# Patient Record
Sex: Female | Born: 1945 | Race: White | Hispanic: No | Smoking: Never smoker
Health system: Southern US, Community
[De-identification: ages and names within clinical notes are randomized; demographics above are authoritative.]

## PROBLEM LIST (undated history)

## (undated) DIAGNOSIS — I480 Paroxysmal atrial fibrillation: Secondary | ICD-10-CM

## (undated) DIAGNOSIS — R059 Cough, unspecified: Secondary | ICD-10-CM

## (undated) DIAGNOSIS — R05 Cough: Secondary | ICD-10-CM

## (undated) DIAGNOSIS — R9430 Abnormal result of cardiovascular function study, unspecified: Secondary | ICD-10-CM

## (undated) DIAGNOSIS — M1990 Unspecified osteoarthritis, unspecified site: Secondary | ICD-10-CM

## (undated) DIAGNOSIS — T464X5A Adverse effect of angiotensin-converting-enzyme inhibitors, initial encounter: Secondary | ICD-10-CM

## (undated) DIAGNOSIS — R058 Other specified cough: Secondary | ICD-10-CM

## (undated) DIAGNOSIS — R079 Chest pain, unspecified: Secondary | ICD-10-CM

## (undated) DIAGNOSIS — H409 Unspecified glaucoma: Secondary | ICD-10-CM

## (undated) DIAGNOSIS — Z862 Personal history of diseases of the blood and blood-forming organs and certain disorders involving the immune mechanism: Secondary | ICD-10-CM

## (undated) DIAGNOSIS — I499 Cardiac arrhythmia, unspecified: Secondary | ICD-10-CM

## (undated) DIAGNOSIS — C801 Malignant (primary) neoplasm, unspecified: Secondary | ICD-10-CM

## (undated) DIAGNOSIS — Z853 Personal history of malignant neoplasm of breast: Secondary | ICD-10-CM

## (undated) DIAGNOSIS — E785 Hyperlipidemia, unspecified: Secondary | ICD-10-CM

## (undated) DIAGNOSIS — R55 Syncope and collapse: Secondary | ICD-10-CM

## (undated) DIAGNOSIS — E782 Mixed hyperlipidemia: Secondary | ICD-10-CM

## (undated) DIAGNOSIS — C50919 Malignant neoplasm of unspecified site of unspecified female breast: Secondary | ICD-10-CM

## (undated) DIAGNOSIS — D649 Anemia, unspecified: Secondary | ICD-10-CM

## (undated) DIAGNOSIS — J189 Pneumonia, unspecified organism: Secondary | ICD-10-CM

## (undated) DIAGNOSIS — Z9221 Personal history of antineoplastic chemotherapy: Secondary | ICD-10-CM

## (undated) DIAGNOSIS — S82891A Other fracture of right lower leg, initial encounter for closed fracture: Secondary | ICD-10-CM

## (undated) DIAGNOSIS — T8859XA Other complications of anesthesia, initial encounter: Secondary | ICD-10-CM

## (undated) DIAGNOSIS — K219 Gastro-esophageal reflux disease without esophagitis: Secondary | ICD-10-CM

## (undated) DIAGNOSIS — Z973 Presence of spectacles and contact lenses: Secondary | ICD-10-CM

## (undated) DIAGNOSIS — I219 Acute myocardial infarction, unspecified: Secondary | ICD-10-CM

## (undated) DIAGNOSIS — N819 Female genital prolapse, unspecified: Secondary | ICD-10-CM

## (undated) DIAGNOSIS — I10 Essential (primary) hypertension: Secondary | ICD-10-CM

## (undated) DIAGNOSIS — Z923 Personal history of irradiation: Secondary | ICD-10-CM

## (undated) DIAGNOSIS — I5181 Takotsubo syndrome: Secondary | ICD-10-CM

## (undated) DIAGNOSIS — E039 Hypothyroidism, unspecified: Secondary | ICD-10-CM

## (undated) DIAGNOSIS — R600 Localized edema: Secondary | ICD-10-CM

## (undated) DIAGNOSIS — G4733 Obstructive sleep apnea (adult) (pediatric): Secondary | ICD-10-CM

## (undated) DIAGNOSIS — M359 Systemic involvement of connective tissue, unspecified: Secondary | ICD-10-CM

## (undated) DIAGNOSIS — I700 Atherosclerosis of aorta: Secondary | ICD-10-CM

## (undated) HISTORY — DX: Adverse effect of angiotensin-converting-enzyme inhibitors, initial encounter: T46.4X5A

## (undated) HISTORY — DX: Cough: R05

## (undated) HISTORY — DX: Paroxysmal atrial fibrillation: I48.0

## (undated) HISTORY — PX: BACK SURGERY: SHX140

## (undated) HISTORY — PX: BUNIONECTOMY: SHX129

## (undated) HISTORY — DX: Syncope and collapse: R55

## (undated) HISTORY — PX: OTHER SURGICAL HISTORY: SHX169

## (undated) HISTORY — DX: Mixed hyperlipidemia: E78.2

## (undated) HISTORY — DX: Takotsubo syndrome: I51.81

## (undated) HISTORY — DX: Hypothyroidism, unspecified: E03.9

## (undated) HISTORY — DX: Chest pain, unspecified: R07.9

## (undated) HISTORY — DX: Other specified cough: R05.8

## (undated) HISTORY — DX: Unspecified osteoarthritis, unspecified site: M19.90

## (undated) HISTORY — PX: EYE SURGERY: SHX253

## (undated) HISTORY — DX: Abnormal result of cardiovascular function study, unspecified: R94.30

## (undated) HISTORY — PX: COLONOSCOPY: SHX174

## (undated) HISTORY — DX: Cough, unspecified: R05.9

## (undated) HISTORY — DX: Obstructive sleep apnea (adult) (pediatric): G47.33

## (undated) HISTORY — DX: Hyperlipidemia, unspecified: E78.5

## (undated) HISTORY — PX: GANGLION CYST EXCISION: SHX1691

## (undated) HISTORY — DX: Personal history of malignant neoplasm of breast: Z85.3

## (undated) HISTORY — PX: UPPER GI ENDOSCOPY: SHX6162

## (undated) HISTORY — PX: CHOLECYSTECTOMY: SHX55

## (undated) HISTORY — DX: Gastro-esophageal reflux disease without esophagitis: K21.9

---

## 1995-12-04 HISTORY — PX: TOTAL ABDOMINAL HYSTERECTOMY: SHX209

## 2000-10-10 ENCOUNTER — Encounter: Payer: Self-pay | Admitting: Neurosurgery

## 2000-10-10 ENCOUNTER — Ambulatory Visit (HOSPITAL_COMMUNITY): Admission: RE | Admit: 2000-10-10 | Discharge: 2000-10-11 | Payer: Self-pay | Admitting: Neurosurgery

## 2003-12-04 DIAGNOSIS — Z923 Personal history of irradiation: Secondary | ICD-10-CM

## 2003-12-04 DIAGNOSIS — C50919 Malignant neoplasm of unspecified site of unspecified female breast: Secondary | ICD-10-CM

## 2003-12-04 DIAGNOSIS — Z853 Personal history of malignant neoplasm of breast: Secondary | ICD-10-CM

## 2003-12-04 DIAGNOSIS — Z9221 Personal history of antineoplastic chemotherapy: Secondary | ICD-10-CM

## 2003-12-04 HISTORY — DX: Personal history of antineoplastic chemotherapy: Z92.21

## 2003-12-04 HISTORY — DX: Malignant neoplasm of unspecified site of unspecified female breast: C50.919

## 2003-12-04 HISTORY — DX: Personal history of malignant neoplasm of breast: Z85.3

## 2003-12-04 HISTORY — PX: BREAST LUMPECTOMY: SHX2

## 2003-12-04 HISTORY — DX: Personal history of irradiation: Z92.3

## 2004-02-24 ENCOUNTER — Encounter: Admission: RE | Admit: 2004-02-24 | Discharge: 2004-02-24 | Payer: Self-pay | Admitting: Surgery

## 2004-02-24 IMAGING — CR DG CHEST 2V
2 series · 2 of 2 positions shown · non-contrast
Comparison: none

CLINICAL DATA: Left breast cancer. 
 CHEST, TWO VIEWS 
 The heart size and mediastinal contours are normal.  The lungs are clear.  The visualized skeleton is unremarkable.
 IMPRESSION
 No active disease.

[view not recorded (1 of 2)]
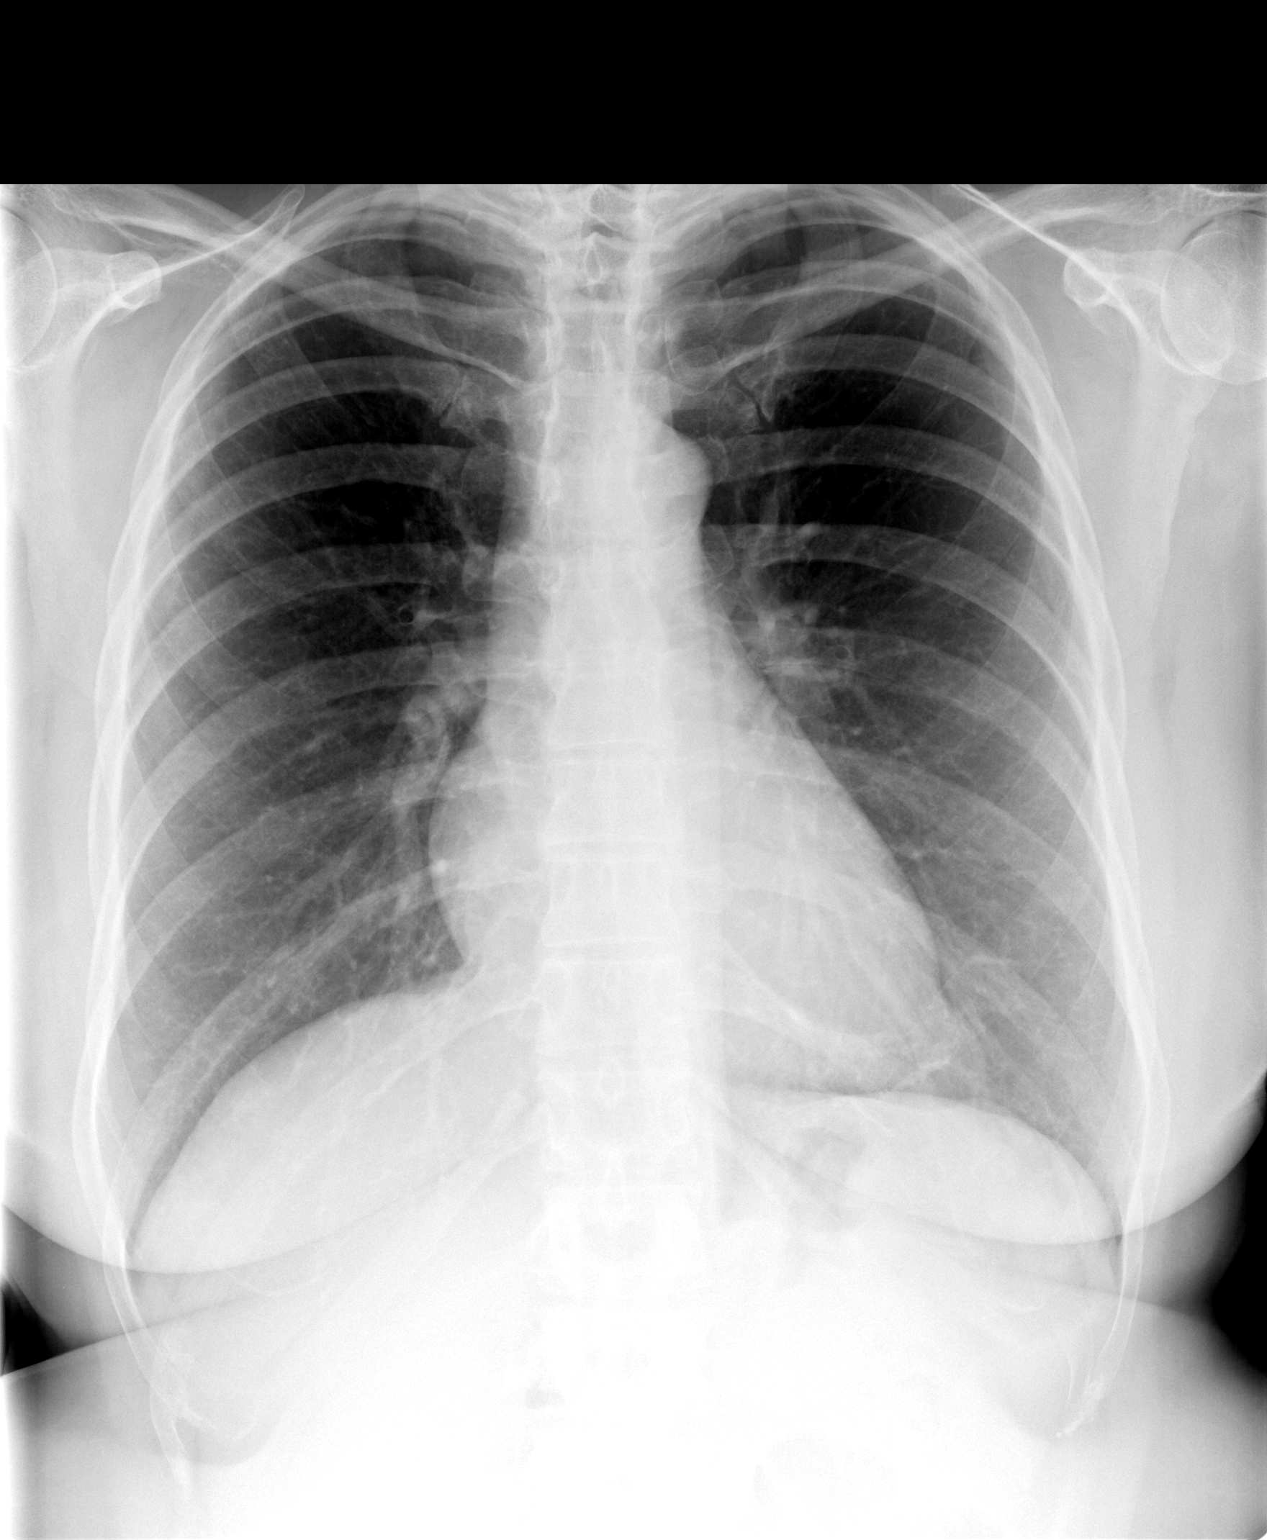

[view not recorded (2 of 2)]
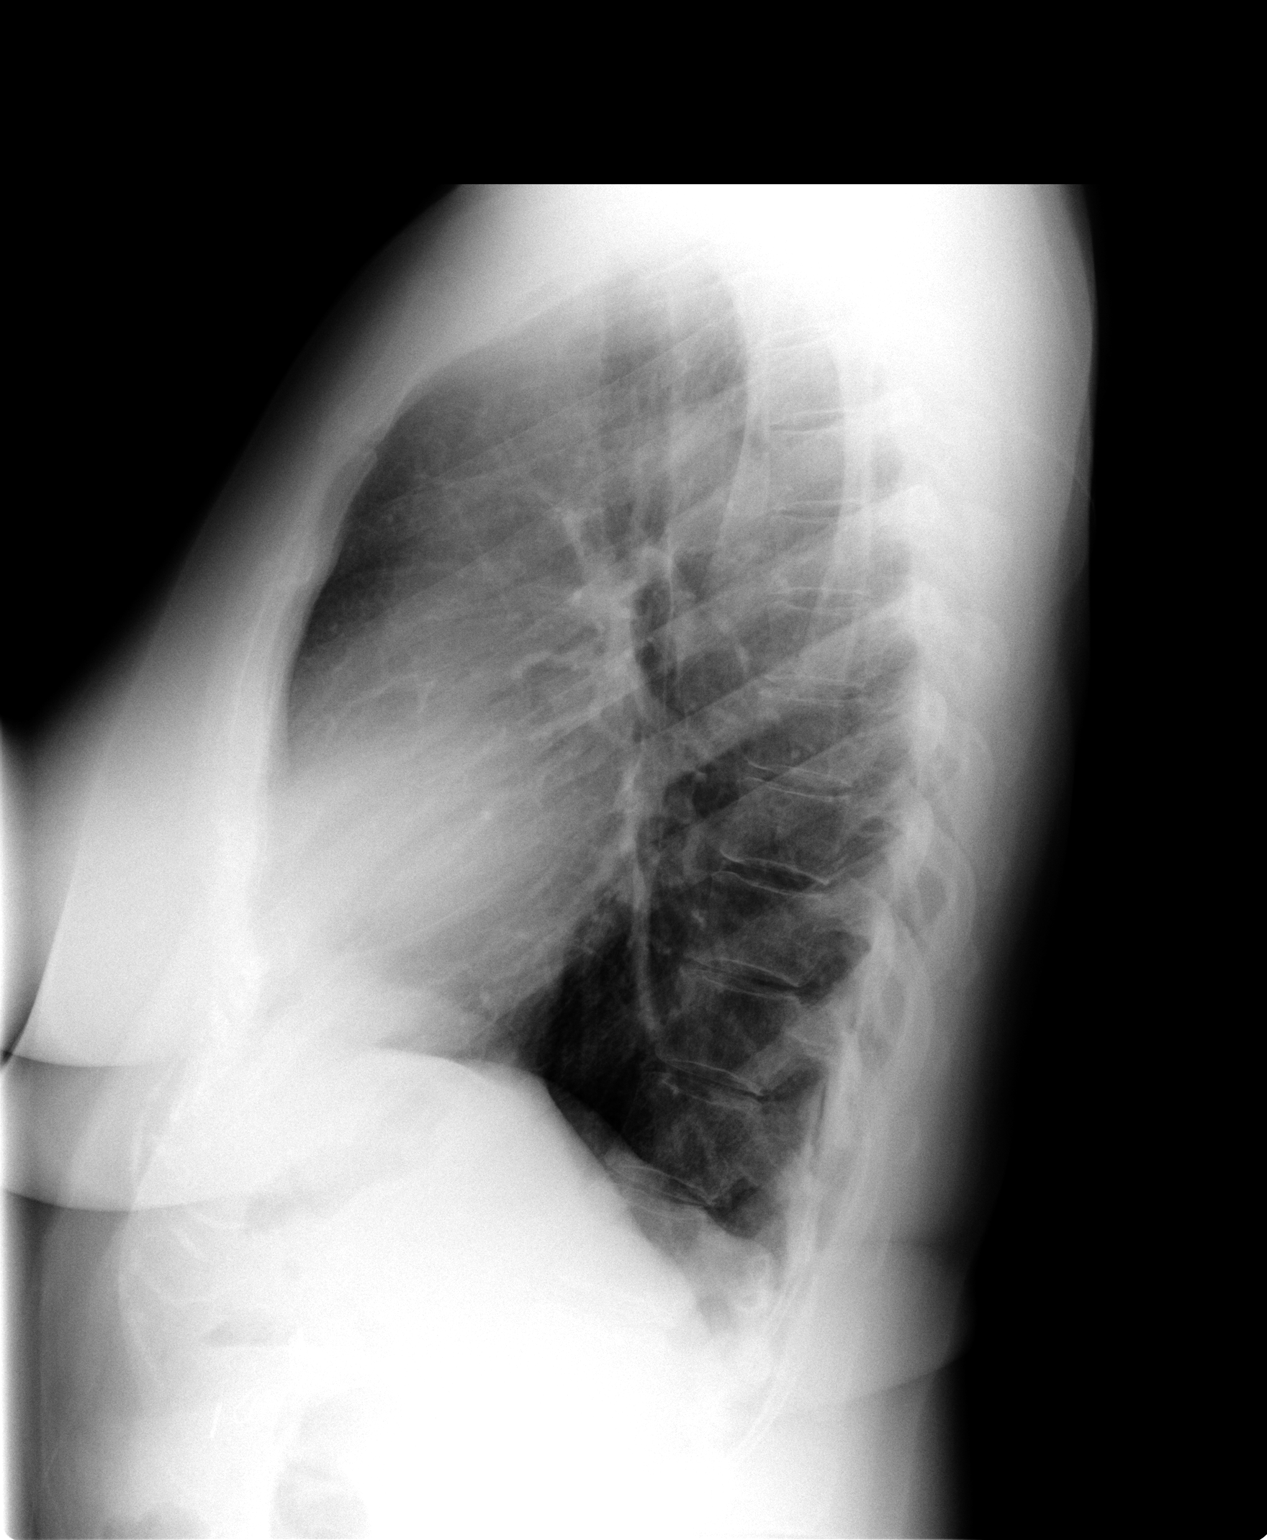

[2 of 2 positions shown; findings below may reference images not displayed]

## 2004-03-01 ENCOUNTER — Encounter: Admission: RE | Admit: 2004-03-01 | Discharge: 2004-03-01 | Payer: Self-pay | Admitting: Surgery

## 2004-03-06 ENCOUNTER — Encounter (HOSPITAL_COMMUNITY): Admission: RE | Admit: 2004-03-06 | Discharge: 2004-06-04 | Payer: Self-pay | Admitting: Obstetrics and Gynecology

## 2004-03-09 ENCOUNTER — Encounter: Admission: RE | Admit: 2004-03-09 | Discharge: 2004-03-09 | Payer: Self-pay | Admitting: Surgery

## 2004-03-09 ENCOUNTER — Encounter (INDEPENDENT_AMBULATORY_CARE_PROVIDER_SITE_OTHER): Payer: Self-pay | Admitting: *Deleted

## 2004-03-09 ENCOUNTER — Ambulatory Visit (HOSPITAL_COMMUNITY): Admission: RE | Admit: 2004-03-09 | Discharge: 2004-03-09 | Payer: Self-pay | Admitting: Surgery

## 2004-03-09 ENCOUNTER — Ambulatory Visit (HOSPITAL_BASED_OUTPATIENT_CLINIC_OR_DEPARTMENT_OTHER): Admission: RE | Admit: 2004-03-09 | Discharge: 2004-03-09 | Payer: Self-pay | Admitting: Surgery

## 2005-08-19 ENCOUNTER — Emergency Department (HOSPITAL_COMMUNITY): Admission: EM | Admit: 2005-08-19 | Discharge: 2005-08-19 | Payer: Self-pay | Admitting: Emergency Medicine

## 2006-12-03 DIAGNOSIS — I5181 Takotsubo syndrome: Secondary | ICD-10-CM

## 2006-12-03 HISTORY — DX: Takotsubo syndrome: I51.81

## 2007-10-24 ENCOUNTER — Ambulatory Visit: Payer: Self-pay | Admitting: Cardiology

## 2007-10-24 ENCOUNTER — Inpatient Hospital Stay (HOSPITAL_COMMUNITY): Admission: AD | Admit: 2007-10-24 | Discharge: 2007-10-26 | Payer: Self-pay | Admitting: Cardiology

## 2007-11-24 ENCOUNTER — Ambulatory Visit: Payer: Self-pay | Admitting: Cardiology

## 2007-11-24 ENCOUNTER — Encounter: Payer: Self-pay | Admitting: Physician Assistant

## 2007-12-08 ENCOUNTER — Encounter: Payer: Self-pay | Admitting: Physician Assistant

## 2008-01-15 ENCOUNTER — Encounter: Payer: Self-pay | Admitting: Cardiology

## 2008-01-15 ENCOUNTER — Ambulatory Visit: Payer: Self-pay | Admitting: Cardiology

## 2008-01-21 ENCOUNTER — Ambulatory Visit: Payer: Self-pay | Admitting: Cardiology

## 2008-03-03 ENCOUNTER — Encounter: Payer: Self-pay | Admitting: Physician Assistant

## 2008-09-24 ENCOUNTER — Ambulatory Visit: Payer: Self-pay | Admitting: Cardiology

## 2009-03-08 ENCOUNTER — Ambulatory Visit: Payer: Self-pay | Admitting: Cardiology

## 2009-06-03 ENCOUNTER — Encounter: Payer: Self-pay | Admitting: Cardiology

## 2009-06-10 ENCOUNTER — Encounter (INDEPENDENT_AMBULATORY_CARE_PROVIDER_SITE_OTHER): Payer: Self-pay | Admitting: *Deleted

## 2009-10-10 ENCOUNTER — Ambulatory Visit: Payer: Self-pay | Admitting: Cardiology

## 2010-10-23 ENCOUNTER — Ambulatory Visit: Payer: Self-pay | Admitting: Cardiology

## 2010-10-23 DIAGNOSIS — K449 Diaphragmatic hernia without obstruction or gangrene: Secondary | ICD-10-CM | POA: Insufficient documentation

## 2011-01-02 NOTE — Assessment & Plan Note (Signed)
Summary: 1 yr fu per nov reminder   Visit Type:  Follow-up Primary Provider:  Dr. Caryl Asp ([place])  CC:  chest pain.  History of Present Illness: Patient is seen for followup of a history of chest discomfort.  At a time of extreme stress in 2008 she had a Tako-tsuboevent.   She had anteroapical wall motion abnormality that improved by echo in 2009. She is doing well. She has had symptoms of GERD recently. She's taking medications and feeling better.  She is going to be referred to gastroenterology.  He's had some slight discomfort in her left shoulder.  I feel that this is not ischemic in origin.  Preventive Screening-Counseling & Management  Alcohol-Tobacco     Smoking Status: never  Current Medications (verified): 1)  Carvedilol 3.125 Mg Tabs (Carvedilol) .... Take 1 Tablet By Mouth Twice A Day 2)  Ramipril 5 Mg Caps (Ramipril) .... Take 1 Capsule By Mouth Once A Day 3)  Simvastatin 10 Mg Tabs (Simvastatin) .... Take 1 Tab By Mouth At Bedtime 4)  Synthroid 100 Mcg Tabs (Levothyroxine Sodium) .... Take 1 Tablet By Mouth Once A Day 5)  Aspirin 81 Mg Tbec (Aspirin) .... Take One Tablet By Mouth Daily 6)  Prilosec 20 Mg Cpdr (Omeprazole) .... Over The Counter As Needed 7)  Lumigan 0.03 % Soln (Bimatoprost) .... One Drop Into Each Eye At Bedtime 8)  Meclizine Hcl 25 Mg Tabs (Meclizine Hcl) .... Take 1 Tablet By Mouth Three Times A Day As Needed  Allergies (verified): No Known Drug Allergies  Comments:  Nurse/Medical Assistant: The patient's medications and allergies were verbally reviewed with the patient and were updated in the Medication and Allergy Lists.  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 minimal coronary disease... anteroseptal akinesis with an EF 55% EF  60-65%..... echo... February, 2009.Marland Kitchen.(LV function normalized) one and a HYPERTENSION, UNSPECIFIED (ICD-401.9) HYPERLIPIDEMIA-MIXED  (ICD-272.4) Hypothyroidism GERD  Review of Systems       Patient denies fever, chills, headache, sweats, rash, change in vision, change in hearing, chest pain, cough, nausea vomiting, urinary symptoms.  All other systems are reviewed and are negative.  Vital Signs:  Patient profile:   65 year old female Height:      65 inches Weight:      186 pounds BMI:     31.06 Pulse rate:   65 / minute BP sitting:   126 / 83  (left arm) Cuff size:   large  Vitals Entered By: Carlye Grippe (October 23, 2010 10:34 AM)  Nutrition Counseling: Patient's BMI is greater than 25 and therefore counseled on weight management options.  Physical Exam  General:  patient is stable.  She has gained weight. Head:  head is atraumatic. Eyes:  no xanthelasma. Neck:  no jugular venous distention. Chest Wall:  no chest wall tenderness. Lungs:  lungs are clear.  Respiratory effort is nonlabored. Heart:  cardiac exam reveals an S1-S2.  No clicks or significant murmurs. Abdomen:  abdomen is soft. Msk:  no musculoskeletal deformities. Extremities:  no peripheral edema. Skin:  no skin rashes. Psych:  patient is oriented to person time and place.  Affect is normal.   Impression & Recommendations:  Problem # 1:  GERD (ICD-530.81)  Her updated medication list for this problem includes:    Prilosec 20 Mg Cpdr (Omeprazole) ..... Over the counter as needed Recently the patient has had significant symptoms that do sound like GERD.  I have given her some  advice about being sure she does not recline after eating.  He needs to have her supper fully digested before going to bed.  I suggested some blocks under the head of her bed.  I talked with her about the timing of taking her Prilosec.  I do believe that the symptom is GERD and not ischemia.  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  It is normal.  I do not think that the  discomfort in her shoulder represents ischemia.  Overall cardiac status is stable.  No change in therapy.  Problem # 3:  HYPERLIPIDEMIA-MIXED (ICD-272.4)  Her updated medication list for this problem includes:    Simvastatin 10 Mg Tabs (Simvastatin) .Marland Kitchen... Take 1 tab by mouth at bedtime The patient is receiving medication for her lipids.  Follow up was with her primary M.D.  Problem # 4:  HYPERTENSION, UNSPECIFIED (ICD-401.9) Blood pressure is controlled.  No change in therapy.  We'll see her back in one year for cardiology followup.  Patient Instructions: 1)  Your physician wants you to follow-up in: 1 year. You will receive a reminder letter in the mail one-two months in advance. If you don't receive a letter, please call our office to schedule the follow-up appointment. 2)  Your physician recommends that you continue on your current medications as directed. Please refer to the Current Medication list given to you today.

## 2011-01-02 NOTE — Miscellaneous (Signed)
  Clinical Lists Changes  Observations: Added new observation of PRIMARY MD: Ladean Raya, MD ([place]) (10/23/2010 13:58)

## 2011-02-27 ENCOUNTER — Other Ambulatory Visit: Payer: Self-pay | Admitting: *Deleted

## 2011-02-27 DIAGNOSIS — I10 Essential (primary) hypertension: Secondary | ICD-10-CM

## 2011-02-27 MED ORDER — CARVEDILOL 3.125 MG PO TABS: 3.1250 mg | ORAL_TABLET | Freq: Two times a day (BID) | ORAL | Status: DC

## 2011-04-17 NOTE — Cardiovascular Report (Signed)
[name], [name]              ACCOUNT NO.:  [id number]   MEDICAL RECORD NO.:  [id number]          PATIENT TYPE:  INP   LOCATION:  2902                         FACILITY:  [facility]   PHYSICIAN:  Julie R. Juanda Chance, MD, FACCDATE OF BIRTH:  [birth year]   DATE OF PROCEDURE:  10/24/2007  DATE OF DISCHARGE:                            CARDIAC CATHETERIZATION   CLINICAL HISTORY:  Julie Jefferson is 65 years old and has had no prior  history of heart disease, although she does have a positive family  history of coronary heart disease.  She developed chest pain yesterday  and then had recurrent pain this morning and was seen at her private  medical doctor's office in [place] where her ECG was abnormal.  She  was sent to [facility] emergency room and had recurrent or  persistent chest pain and ECG suggesting an anteroseptal infarction.  She was seen by Dr. Myrtis Ser, and a code STEMI was called.  She was  transferred to [place] for further evaluation.  She was painfree on arrival.  She has been under a great deal of stress.  Yesterday, she was involved  in a law suit with her son and had to testify against her son.   PROCEDURE:  The procedure was performed by the femoral artery using an  arterial sheath and 6-French preformed coronary catheters.  A front wall  arterial puncture was performed.  Omnipaque contrast was used.  The  patient tolerated the procedure well and left the laboratory in  satisfactory condition.   RESULTS:  The left main coronary artery was free of significant disease.   The left anterior descending artery gave rise to a large diagonal branch  and two septal perforators and a small second diagonal branch.  There  were irregularities in the LAD, and there was 30% narrowing in the  midvessel but no significant obstructive disease.   The circumflex artery gave rise to an atrial branch, a marginal branch,  and a posterolateral branch.  These vessels appeared fairly smooth and  free of significant disease.   The right coronary artery gave rise to a right ventricle branch, a  posterior descending branch, and a posterolateral branch.  There were  mild irregularities, and there was 30% narrowing in the midvessel.   The left ventriculogram performed in the RAO projection showed akinesis  of the anterior wall.  The apex was a spared.  The estimated ejection  fraction was 55%.   The left ventriculogram performed in the LAO projection showed akinesis  of the superior aspect of the septum.  Once again, the apex was spared  and the lateral wall was vigorous.   The aortic pressure was 143/79 with a mean of 105, and the left neck  pressure was 143/10.   CONCLUSION:  1. Nonobstructive coronary artery disease with 30% narrowing in the      mid-left anterior descending, no significant obstruction of the      circumflex artery, and 30% narrowing in the mid-right coronary      artery.  2. Anteroseptal akinesis with overall good left ventricular function  with an estimated ejection fraction of 55%.   RECOMMENDATIONS:  The patient has only nonobstructive disease and has a  wall motion abnormality that does not appear to fit the distribution of  any of her coronary arteries.  She was involved in a stressful situation  yesterday with her son and had to testify against him in court.  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  It is unusual in that the apex is not  involved, but I suspect this is the most likely diagnosis.  We will plan  to treat her as an acute coronary syndrome with aspirin, Plavix, beta  blockers, and ACE inhibitors, with the hope and expectation that she  will have good recovery of her LV function.      Julie Elvera Lennox Juanda Chance, MD, [facility]  Electronically Signed     BRB/MEDQ  D:  10/24/2007  T:  10/25/2007  Job:  [id number]   cc:   Luis Abed, MD, [facility]  Cardiopulmonary Lab

## 2011-04-17 NOTE — Assessment & Plan Note (Signed)
[facility]                          [facility]   Julie Jefferson                     MRN:          [id number]  DATE:01/21/2008                            DOB:          [birth year]    REASON FOR VISIT:  Julie Jefferson is seen for cardiology followup.   HISTORY:  She is doing very well.  She had presented to [facility] with  chest discomfort and we were concerned about the possibility of an acute  MI.  Catheterization revealed only minor scattered 30% lesions.  She did  have anteroseptal akinesis with an ejection fraction of 55%.  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  She was  treated with all the appropriate medicines and she has done well at  home.  She is losing weight.  She is active in the rehab program and she  is feeling well.  She knows that if she gets towards high level of  stress, that she understands the situation and controls it better to  prevent having another stress-related event.  Today in the office, she  is stable.   ALLERGIES:  NO KNOWN DRUG ALLERGIES.   MEDICATIONS:  1. Aspirin 325.  2. Plavix 75.  3. Ramipril 2.5.  4. Eye drops.  5. Tamoxifen 20.  6. Coreg 3.125 b.i.d.  7. Synthroid 100.  8. Simvastatin 10.   OTHER MEDICAL PROBLEMS:  See the list below.   REVIEW OF SYSTEMS:  She feels great and her review of systems is  negative.   PHYSICAL EXAMINATION:  VITAL SIGNS:  Weight is 178 pounds, blood  pressure is 124/86 with a rate of 66.  GENERAL:  The patient is oriented to person, time and place.  Affect is  normal.  She is here with her daughter.  HEENT:  Reveals no xanthelasma.  She has normal extraocular motion.  NECK:  There are no carotid bruits.  There is no jugular venous  distention.  LUNGS:  Clear.  Respiratory effort is not labored.  CARDIAC:  Reveals S1-S2.  There are no clicks or significant murmurs.  ABDOMEN:  Soft.  EXTREMITIES:  She has no peripheral  edema.   DIAGNOSTICS:  The patient had a 2-D echo done in followup.  It is dated  January 15, 2008.  This study reveals mild LVH.  There is normal LV  function.  There are no focal wall motion abnormalities.  Therefore, the  left ventricular function has normalized completely.   PROBLEMS:  1. Takotsubo cardiomyopathy event that has stabilized and treated.      She is keeping her stress level down  2. Mild nonobstructive coronary disease.  She is on a low-dose statin      and Dr. Melvyn Neth will follow up her labs including left ventricular      function.  3. Good left ventricular function that has completely normalized now.  4. Hypertension.  5. Treated hypothyroidism.  6. History of breast cancer status post lumpectomy, radiation and      chemotherapy in the past.  She is stable.  She is doing well  and      quite active.   PLAN:  I will see her back in 6 months.  We will certainly consider  stopping her Plavix at that time.  If she were to develop any type of  bleeding difficulties or if her Plavix needed to be stopped for any type  of procedures, I think that this would be quite reasonable for her in  this setting.     Luis Abed, MD, [facility]  Electronically Signed    JDK/MedQ  DD: 01/21/2008  DT: 01/22/2008  Job #: [id number]   cc:   Caryl Asp, MD

## 2011-04-17 NOTE — Assessment & Plan Note (Signed)
[facility]                          [facility] NOTE   MISTEY, HOFFERT                     MRN:          [id number]  DATE:03/08/2009                            DOB:          Aug 15, 1946    Julie Jefferson is doing very well.  When I saw her last, she had traveled  to [place] and [place].  Since her last visit, she has been to [place] and  she was very excited about the trip and we talked about [place] at great  length.  The patient is feeling well.  She has not had any chest pain.  She is going about full activities.  Her blood pressure is under good  control and she continues to take her medications.   The patient had a Tako-tsubo-type event with a wall motion abnormality  in November 2008.  She had nonobstructive coronary disease.  Echo in  February 2009 showed normalization of her LV function.   PAST MEDICAL HISTORY:   ALLERGIES:  No known drug allergies.   MEDICATIONS:  Aspirin eye drops, tamoxifen, Coreg, Synthroid,  simvastatin, and ramipril.   OTHER MEDICAL PROBLEMS:  See the list on my note of September 24, 2008.   REVIEW OF SYSTEMS:  She has no fevers or chills.  There are no  headaches.  There are no skin rashes.  There is no change in her vision  or her hearing.  She has no shortness of breath or chest pain.  She has  no GI or GU symptoms.  There are no musculoskeletal complaints.  She has  no neurologic problems.  There are no psychological problems.   All other systems are reviewed and are negative.   PHYSICAL EXAMINATION:  VITAL SIGNS:  Blood pressure is 134/80 with a  pulse of 66.  Weight is 175 pounds.  GENERAL:  The patient is oriented to person, time, and place.  Affect is  normal.  HEENT:  No xanthelasma.  She has normal extraocular motion.  NECK:  There are no carotid bruits.  There is no jugular venous  distention.  LUNGS:  Clear.  Respiratory effort is not labored.  CARDIAC:  An S1 with an S2.  There are no  clicks or significant murmurs.  ABDOMEN:  Soft.  She has no peripheral edema.   EKG is done today.  I have reviewed it completely.  It is normal.  She  has normal sinus rhythm.   Problems are listed on my note of September 24, 2008.  The patient had a  Tako-tsubo-type event.  She has had no recurrence.  Her LV function has  normalized.  Her blood pressure is under good  control.  She is on low-dose simvastatin.  She is on thyroid medication.  She is stable.  I will see her back in 6 months.     Luis Abed, MD, [facility]  Electronically Signed    JDK/MedQ  DD: 03/08/2009  DT: 03/08/2009  Job #: [id number]   cc:   Dr. Caryl Asp

## 2011-04-17 NOTE — Assessment & Plan Note (Signed)
[facility]ALTHC[facility]                          [facility] NOTE   MARICIA, SCOTTI                       MRN:          [id number]  DATE:10/26/2007                            DOB:          1946-07-31    Ms. Zakrzewski is the mother of [name] who we know from [facility].  The patient was seen first on an urgent basis in the  emergency room at [facility] on October 24, 2007.  She had  severe stress the day before and chest pain.  Ultimately, she was taken  to [facility] and her catheterization showed only very minimal  coronary disease.  She had a wall motion abnormality in the mid anterior  wall and the base of the septum.  This did not fit any classic coronary  distribution.  Dr. Juanda Chance thought that most likely she had a variant of  Tako-Tsubo.   She is stable.  She will go home on aspirin, Plavix, ramipril, and  carvedilol.   We will see her back in the [place] office.  I will follow her there.  We  will arrange for followup echo.  Hopefully, her meds can be stopped over  time if her LV normalizes.     Luis Abed, MD, [facility]  Electronically Signed    JDK/MedQ  DD: 10/26/2007  DT: 10/26/2007  Job #: [id number]

## 2011-04-17 NOTE — Assessment & Plan Note (Signed)
[facility]                          [facility] NOTE   VEGAS, COFFIN                     MRN:          [id number]  DATE:11/24/2007                            DOB:          06/04/1946    CARDIOLOGIST:  Dr. Willa Rough.   PRIMARY CARE PHYSICIAN:  Dr. Caryl Asp in [place], [place].   REASON FOR VISIT:  Post hospitalization followup.   HISTORY OF PRESENT ILLNESS:  Ms. Kuehl is a very pleasant 65 year old  female patient who presents to the office today for post hospitalization  followup.  She presented to the emergency room at [facility] with  complaints of chest discomfort that started during a very stressful  court proceeding.  She had EKG changes and was transported emergently to  [facility] for a code STEMI.  Cardiac catheterization was  performed by Dr. Juanda Chance.  She had nonobstructive disease with 30%  disease in the mid left anterior descending, and 30% disease in the mid  right coronary artery.  She had anteroseptal akinesis with good left  ventricular function with an ejection fraction of 55%.  It was felt that  her overall picture was most consistent with a variant of Tako-Tsubo  cardiomyopathy.  She was treated with carvedilol, Altace, aspirin, and  Plavix.  She had a fairly uneventful post procedure course, and returns  to the office today for followup.   She notes she is doing well.  She usually gets along without chest pain  or shortness of breath.  She will occasionally rush to get up the steps  or up the hill, and will note some shortness of breath and anterior  chest tightness.  She describes NYHA class II symptoms.  She denies  orthopnea, PND, or pedal edema.  Denies any syncope.  She denies any  associated arm or jaw discomfort.   CURRENT MEDICATIONS:  1. Aspirin 325 mg daily.  2. Plavix 75 mg daily.  3. Ramipril 2.5 mg daily.  4. Lumigan eye drops.  5. Tamoxifen 20 mg daily.  6.  Carvedilol 3.125 mg b.i.d.  7. Synthroid 100 mcg daily.  8. Prilosec OTC p.r.n.   ALLERGIES:  No known drug allergies.   PHYSICAL EXAMINATION:  She is a well-nourished, well-developed female in  no distress.  Blood pressure is 145/92, pulse 64, weight 182 pounds.  HEENT:  Normal.  NECK:  Without JVD.  CARDIAC:  Normal S1, S2, regular rate and rhythm.  Soft, 1/6 systolic  ejection murmur best along the right upper sternal border.  LUNGS:  Clear to auscultation bilaterally without wheezing, rhonchi, or  rales.  ABDOMEN:  Soft, nontender with normoactive bowel sounds, no  organomegaly.  EXTREMITIES:  Without edema.  Right femoral arteriotomy site without  hematoma or bruit.  VASCULAR EXAM:  Without carotid bruits bilaterally.   Electrocardiogram reveals sinus rhythm with a heart rate of 64, normal  axis, poor R-wave progression, low voltage.   IMPRESSION:  1. Tako-Tsubo cardiomyopathy      a.     Status post acute coronary syndrome in the setting of #1       (  peak troponin 0.40).  2. Nonobstructive coronary artery disease as outlined above.  3. Good left ventricular function with anteroseptal akinesis at the      time of catheterization.  4. Hypertension.  5. Treated hypothyroidism.  6. History of breast carcinoma, status post lumpectomy and      radiation/chemotherapy.   PLAN:  The patient presents in the office today for a followup.  Overall, she is doing well.  I think she would benefit from cardiac  rehab, and she has not heard from the staff at [facility].  Her blood pressure is somewhat borderline.  At this point in time I plan  to:  1. Increase her Altace to 5 mg a day.  2. Will check a BMET today and a followup BMET in 10 days to followup      on her renal function and potassium, given the increase in her      angiotensin-converting enzyme inhibitor.  3. Will initiate low-dose statin with simvastatin 10 mg nightly for      cardiac benefit.  She will have  lipids and liver function tests      drawn in 12 weeks in followup.  4. We will recheck an echocardiogram in 6 weeks to reassess her wall      motion abnormality and ejection fraction.  5. Will facilitate referral to cardiac rehab at [facility].  6. She will follow up with Dr. Myrtis Ser in the next 3 months or sooner      p.r.n.      Tereso Newcomer, PA-C  Electronically Signed      Luis Abed, MD, [facility]  Electronically Signed   SW/MedQ  DD: 11/24/2007  DT: 11/24/2007  Job #: [id number]   cc:   Caryl Asp, MD

## 2011-04-17 NOTE — Assessment & Plan Note (Signed)
[facility]                          [facility] NOTE   Julie Jefferson                     MRN:          [id number]  DATE:09/24/2008                            DOB:          January 31, 1946    PRIMARY CARDIOLOGIST:  Julie Abed, MD, [facility].   REASON FOR VISIT:  Scheduled followup.   Julie Jefferson continues to do extremely well from a cardiac standpoint,  since being diagnosed with Takotsubo cardiomyopathy variant back in  November 2008.  She is extremely active with her mission trips, most  recently having come back from [place].  She had also been in [place]  earlier this year.   She reports no interim development of angina pectoris, significant  dyspnea, orthopnea, PND, or lower extremity edema.   Also, Julie Jefferson exercises rigorously on a regular basis, with no  associated symptoms.   CURRENT MEDICATIONS:  Plavix, full dose aspirin, ramipril 2.5 daily,  Coreg 3.125 b.i.d., Synthroid 0.1 daily, simvastatin 10 daily, tamoxifen  20 daily, and Lumigan eye drops.   PHYSICAL EXAMINATION:  VITAL SIGNS:  Blood pressure 130/85, pulse 69,  regular rate, 176 (down 2).  GENERAL:  A 65 year old female, sitting upright, no distress.  HEENT:  Normocephalic, atraumatic.  PERRLA.  EOMI.  NECK:  Palpable carotid pulse without bruits; no JVD.  LUNGS:  Clear to auscultation in all fields.  HEART:  Regular rate and rhythm.  No significant murmurs.  No rubs or  gallops.  ABDOMEN:  Soft, nontender, intact bowel sounds.  EXTREMITIES:  Palpable pulses without edema.  NEURO:  No focal deficit.   IMPRESSION:  1. Takotsubo cardiomyopathy variant.      a.     Normalized LVF (EF 60-65%), normal wall motion, February       2009.      b.     EF of 55% with anteroseptal akinesis by cardiac       catheterization, November 2008.      c.     Nonobstructive coronary artery disease.  2. Hypertension.  3. Dyslipidemia.  4. Hypothyroidism.   PLAN:  1.  Discontinue Plavix.  Reduce aspirin to 81 mg daily.  Otherwise      continue current cardiac      medications.  2. Schedule return clinic followup with Dr. Myrtis Ser in 6 months.      Julie Searing, PA-C  Electronically Signed      Julie Abed, MD, [facility]  Electronically Signed   GS/MedQ  DD: 09/24/2008  DT: 09/25/2008  Job #: [id number]   cc:   Julie Asp, MD

## 2011-04-17 NOTE — Discharge Summary (Signed)
NAMEAYAN, YANKEY NO.:  [id number]   MEDICAL RECORD NO.:  [id number]          PATIENT TYPE:  INP   LOCATION:  2039                         FACILITY:  [facility]   PHYSICIAN:  Luis Abed, MD, FACCDATE OF BIRTH:  [birth year]   DATE OF ADMISSION:  10/24/2007  DATE OF DISCHARGE:  10/26/2007                               DISCHARGE SUMMARY   PRIMARY CARDIOLOGIST:  Luis Abed, MD, [facility]   PRIMARY CARE PHYSICIAN:  Caryl Asp, M.D. in [place], [place].   PROCEDURES PERFORMED DURING HOSPITALIZATION:  Cardiac catheterization:  LAD has 30% small irregularities.  Circumflex was normal.  Right  coronary artery had 30% mid LV AS AI but no gradient, suspect this was  Takotsubo wall motion abnormality does not fit any vascular territory  and patient had recent stress with son in a lawsuit, would recommend  medical treatment.   FINAL DISCHARGE DIAGNOSES:  1. Takotsubo.  2. Hypothyroidism.  3. Diabetes type 2.  4. Hypertension.  5. History of cancer.   HOSPITAL COURSE:  This is a very pleasant 65 year old Caucasian female  with no prior history of coronary artery disease who presented to  [facility] Emergency Room with new onset of chest pain and  electrocardiographic changes worrisome for acute ST elevation myocardial  infarction.  The patient reported feeling four hours of mid sternal  chest discomfort at approximately 4:30 p.m. prior to coming in when she  was in the midst of a very stressful court proceeding, at which time she  developed anterior chest discomfort described as a sitting sensation on  her chest with radiation to the back and some associated dyspnea and  nausea but no diaphoresis or vomiting.  She would rate it as a 7/10.  The symptoms resolved on her own.  The following morning on day of  admission after she had already awakened, she had a similar discomfort  but not as severe as the day before episode and she has experienced  intermittent chest  tightness on and off but is currently pain free.  The  patient did contact her primary care physician and was seen by him  earlier in the day and electrocardiogram revealed normal sinus rhythm  with 1 mm ST segment elevation in leads VI and VII with associated ST  coving, less than 1 mm of ST segment depression in inferior-lateral  leads.  The patient was instructed to come to [facility]  were a repeat EKG showed persistent ST elevation of 1 to 2 mm in leads I  and II.   The patient was given a full dose of aspirin earlier in the morning and  was given another dose that evening.  She was started on heparin in  [facility] Emergency Room, given 300 mg of Plavix, IV Lopressor and IV  nitroglycerin was started and the patient had cardiac enzymes cycled.   The patient was seen and examined by Dr. Willa Rough and Gene Serpe,  PA.  It was found that the patient should be transferred emergently to  the [facility] for cardiac catheterization in this setting.  The patient's  blood glucose was 146 and creatinine was 1.10, BUN was 11.  Her initial total CPK was 145 with a CK-MB of 6.6 and a relative index  of 4.5 with a troponin of 0.40.   On arrival to [facility] Emergency Room, the patient was taken to [facility] Cardiac Catheterization lab where a cardiac catheterization was  completed by Dr. Charlies Constable.  It was found that she had minimal  irregularities in her LAD and right coronary artery, however, she did  have some wall motion abnormalities which showed a variant of Takotsubo,  but no vascular territory was identified.  Her EF was 55%.  The patient  was started on Plavix, aspirin, Ramipril and Coreg during admission.  The patient had no further complaints of chest pain during  hospitalization.  The patient was seen and examined by Dr. Dietrich Pates and  then seen and examined by Dr. Willa Rough on day of discharge.  The  patient was stable with no further complaints of  chest discomfort, was  diagnosed with a non-ST elevated MI, probably Takotsubo event secondary  to severe stress.   On discharge, the patient will be started on aspirin, Plavix, Ramipril  and Coreg and continue her other home medications and follow up with Dr.  Willa Rough in the [place] office.   DISCHARGE LABORATORY:  TSH 2.390, troponin 0.12 and 0.23 respectively,  cholesterol 161, triglycerides 53, HDL 70, LDL 80.  Sodium 142,  potassium 3.5, chloride 110, CO2 25, glucose 89, BUN 11, creatinine  0.84, hemoglobin 11.7, hematocrit 33.9, white blood cells 6.2, platelets  213.   VITAL SIGNS:  Blood pressure 130/70, heart rate 59, respirations 14 to  16.   DISCHARGE MEDICATIONS:  1. Aspirin 325 mg daily.  2. Plavix 75 mg daily.  3. Ramipril 2.5 mg daily.  4. Lumigan 1 drop to both eyes daily.  5. Protonix 80 mg daily.  6. Tamoxifen 20 mg daily.  7. Coreg 3.125 mg twice a day.  8. Synthroid 100 mcg once a day.   ALLERGIES:  No known drug allergies.   FOLLOW-UP PLANS AND APPOINTMENTS:  1. The patient will be seen by Dr. Willa Rough in the [place] office.      Our office will call as this is a weekend and an appointment will      be scheduled.  2. The patient is to follow up with her primary care physician, Dr.      Caryl Asp in [place], for continued medical management.  3. The patient has been given post cardiac catheterization      instructions with particular emphasis on the right groin site for      evidence of bleeding, hematoma, signs of infection, or severe pain.   Time spent with the patient to include physician time 1 hour.      Bettey Mare. Lyman Bishop, NP      Luis Abed, MD, [facility]  Electronically Signed    KML/MEDQ  D:  10/26/2007  T:  10/26/2007  Job:  [id number]   cc:   Caryl Asp, MD

## 2011-04-20 NOTE — Op Note (Signed)
[place]. [facility]  Patient:    Julie Jefferson, PIGMAN                     MRN: [id number] Proc. Date: 10/10/00 Adm. 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  PROCEDURE:  Hemi/semi-laminectomy L4-5 left with foraminotomy and microdiskectomy with microdissection.  SURGEON:  Danae Orleans. Venetia Maxon, M.D.  ASSISTANT:  Tanya Nones. Jeral Fruit, M.D.  ANESTHESIA:  General endotracheal.  ESTIMATED BLOOD LOSS:  50 cc.  COMPLICATIONS:  None.  DISPOSITION:  To recovery.  INDICATIONS:  Julie Jefferson is a 65 year old woman with a herniated lumbar disk at the L4-5 level with significant left L5 radiculopathy.  It was elected to take her to surgery for lumbar microdiskectomy.  DESCRIPTION OF PROCEDURE:  Following the satisfactory and uncomplicated induction of general endotracheal anesthesia and placement of intravenous line, the patient was placed in a prone position on the Wilson frame.  Care was taken to pad all soft tissue and bony prominences.  Area of planned incision was infiltrated with 1/4% Marcaine, 1/2% lidocaine 1:200,000 epinephrine.  After standard prep and draping, an incision was made over what was felt to be the L4-5 interspace which was carried through copious adipose tissue to the lumbodorsal fascia which was then incised with electrocautery and the left side at midline.  Superior ostial dissection was performed exposing what was felt to be L4-5 level.  An intraoperative x-ray demonstrated a probe that was felt to be the L3-4 level, and subsequently the exposure was moved down one level.  Laminectomy was created using Midas Rex drill and 8  bur and completing with Kerrison rongeurs and removing ligamentum flavum and medial facet and exposing the interspace.  The floor of the canal was palpated, and there was not felt to be significant disk herniation. Subsequent x-ray demonstrated a probe at the L3-4 level, consequently exposure was then carried to the L4-5 interspace, and self-retaining retractor was placed at this level.  The laminectomy was created with the Midas Rex drill, ______  bur, and the ligamentum flavum was removed.  There was marked hypertrophy of the facet, and medial facetectomy was performed.  The microscope was then brought into the field and using microdissection technique, the common drill tube was decompressed, and the L5 nerve root was found to be severely compressed by overlying facet and ligamentous tissue. This was decompressed.  the nerve root was then retracted medially, and there was a large spondylitic spur and subligamentous disk herniation directly underlying the takeoff of the L5 nerve root.  Using the Derrico nerve root retractor to protect the nerve root, the disk space was incised, and disk material was removed in a piecemeal fashion with a variety to pituitary rongeurs.  The Epstein curettes were used to denude the endplates of residual cartilaginous and disk material, and medial and lateral extent of the interspace was decompressed using a variety of pituitary rongeurs.  The L4 nerve root was felt to be well decompressed, as it extended out the neuroforamen, and the L5 nerve root was felt to be well decompressed as was the floor  of the spinal canal.  Redundant annulus was cauterized with bipolar electrocautery.  The wound was then copiously irrigated with Bacitracin saline.  The self-retaining retractor was removed.  The microscope was taken out of the field.  Lumbodorsal fascia was reapproximated with 0 Vicryl sutures, subcutaneous tissue was reapproximated with 2-0 Vicryl  interrupted inverted sutures.  The skin edges were reapproximated with interrupted 3-0 Vicryl subcuticular stitch.  The wound was dressed with Benzoin and Steri-Strips and Telfa gauze and tape.  The patient was extubated in the operating room and taken to the recovery room in stable and satisfactory condition having tolerating the operation well.  Counts were correct at the end of the case. DD:  10/10/00 TD:  10/10/00 Job: [id number]

## 2011-04-20 NOTE — Op Note (Signed)
Julie Jefferson, SKILTON                        ACCOUNT NO.:  [id number]   MEDICAL RECORD NO.:  [id number]                   PATIENT TYPE:  AMB   LOCATION:  DSC                                  FACILITY:  [facility]   PHYSICIAN:  Sandria Bales. Ezzard Standing, M.D.               DATE OF BIRTH:  1946-08-08   DATE OF PROCEDURE:  03/09/2004  DATE OF DISCHARGE:                                 OPERATIVE REPORT   PREOPERATIVE DIAGNOSIS:  Left breast cancer.   POSTOPERATIVE DIAGNOSIS:  Left breast cancer with positive left axillary  sentinel lymph node biopsy.   OPERATION PERFORMED:  Injection of isosulfan blue, left partial mastectomy  with needle localization, left sentinel lymph node biopsy done with full  left axillary node dissection.   SURGEON:  Sandria Bales. Ezzard Standing, M.D.   ASSISTANT:  None.   ANESTHESIA:  General endotracheal.   ESTIMATED BLOOD LOSS:  100 mL.   DRAINS:  10 mm Jackson-Pratt drain placed in left axilla.   INDICATIONS FOR PROCEDURE:  Ms. Manchester is a 65 year old white female who is  a patient of Willette Cluster, M.D. and Caryl Asp, M.D. of [facility] in [place], [place].  Her daughter Helmut Muster works at [employer] with the cardiac team and has her mother down here for evaluation.  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  I discussed with her the options for  treatment which included a mastectomy versus lumpectomy with sentinel lymph  node biopsy.  She has elected to proceed with a partial mastectomy  (lumpectomy) and sentinel lymph node biopsy and possible axillary node  dissection.  She hopes that Dr. Tobie Poet of [facility] at  [facility] in [place] can carry out her medical oncology  postoperatively and that Dr. Baruch Merl of the [facility] at  [facility] in [place], [place] can carry out her postoperative  radiation.  I discussed both the  indications and potential complication with  the patient.   DESCRIPTION OF PROCEDURE:  The patient presented to the operating room.  I  had a needle localization wire placed in the left breast. The mass was  somewhat hard to feel but it was right off the edge of the areola at the 1  o'clock position.  She also had sulfur colloid radioisotope injected in a  circumareolar fashion and she presented to the operating room where I  injected around the areola with isosulfan blue.  I then used a tracer to  find and map a left axillary lymph node.  I then prepped the left breast  with Betadine solution and left axilla and sterilely draped her.  I first  went after the sentinel lymph node which was identified with the NeoProbe,  made an incision in the left axilla and cut down to a lymph node which was  probably about 1.5 cm in  length.  It was pale blue.  It had counts of 1800  and the background counts were only 20, so this was a hot node from the  radioisotope standpoint blue node and it was sent to pathology. _________  called me back and told me that the lymph node had gross tumor in it.   I then turned my attention to the left and to the left breast. Using the  guidewire as a guide, I tried to go around at least 1 to 2 cm _________  margins around the edge of this tumor which was partly palpable and partly I  used the guidewire as a guide.  In all directions I felt I did not cut  across any gross tumor.  I sent the entire specimen marking it with markers  caudad, medial and cranial and the wire was coming out lateral, first sent  to Dr. Cheral Marker who said that the specimen seemed well centered on  the specimen mammogram.  Dr. Almyra Free said the margins were clear.   I then turned my attention and did a full axillary node dissection which I  extended to the left axillary dissection carried up to the inferior aspects  of the axillary vein.  I found the long thoracic nerve of Bell and the   thoracodorsal nerve and both these nerves were spared during dissection.  I  just swept the axillary contents out down inferiorly.  I did have some  tributary vessels which were ligated with a clip applier and then some  ligated with 3-0 silk ligations and some with 3-0 Vicryl pop-offs.  When I  had the entire axillary contents excised, I then sent these for permanent  pathology.   I then marked the fresh biopsy site with small clips, the left axilla.  I  irrigated out.  Again, I used clips to ligate some of the vessels and  some  of the small nerves.  I then closed that in two layers, first a deep layer  with 3-0 Vicryl suture, superficial layer with skin staples.  I placed a 10  mm Jackson-Pratt drain in the left axilla and sewed this in place with a 2-0  nylon suture.  I then closed the breast in two layers with a deeper layer of  3-0 Vicryl sutures, skin with a 5-0 running Vicryl suture.  Painted the skin  with tincture of benzoin and Steri-Strips.   Again she will plan to spend the night in the hospital.  As an outpatient  she will be taught how to empty her drain and be controlled with pain  medicines and plan to discharge tomorrow morning.                                               Sandria Bales. Ezzard Standing, M.D.    DHN/MEDQ  D:  03/09/2004  T:  03/10/2004  Job:  [id number]   cc:   Caryl Asp, M.D.  [address]  [place] [address]   Willette Cluster, M.D.  [address]  [place] [address]   Tobie Poet, M.D.  [facility]  [facility]   Baruch Merl, M.D.  [facility]  [facility]  [place]

## 2011-04-20 NOTE — H&P (Signed)
[place]. [facility]  Patient:    Julie Jefferson, Julie Jefferson                     MRN: [id number] Adm. Date:  [id number] Attending:  Josie Saunders                         History and Physical  REASON FOR ADMISSION:  Herniated lumbar disk.  HISTORY OF PRESENT ILLNESS:  Ms. 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, [place], who presented at the request of Dr. Melvyn Neth for neurosurgical consultation for a herniated lumbar disk.  I initially saw her in the office on October 03, 2000.  The patient describes approximately a three-month history of severe left leg pain. She said it started suddenly, and that she is always in pain, and she is not sleeping, and not able to get any rest.  She says that over the last several days she developed some right leg pain which goes down to her right knee, but says that this is not nearly as severe as her left leg.  She describes that she has pain to the calf and back of her calf and heel on the left.  She feels that she has occasional weakness, but not severe weakness at this point.  She has been working, but it is very difficult for her to do so.  She denies any bowel or bladder dysfunction other than constipation, which she says is a normal baseline status for her.  She says that her leg hurts her much worse than her back.  She says that her back is not extremely painful, but more weak.  She is taking Naprosyn and says that it does not help a lot.  She tried Voltaren and says that this does not help her at all.  She tried Neurontin and said that this was not helpful, and made her sleepy the next day after she took it at night.  Julie Jefferson presents with an MRI of the lumbar spine which was obtained at [facility] in [place], which shows significant degenerative disk disease at the L4-5 level on the left, with circumferential disk bulge, and evidence of modic changes  of the end plates of the L4 and L5 vertebrae.  The remaining levels appear to be normal.  At the L4-5 level there is a significant left-sided disk herniation extending to the left of the midline. There is also some milder foraminal stenosis at L4-5 on the right.  This does not appear to be significantly compressing the L4 nerve root in the foramen.  REVIEW OF SYSTEMS:  A detailed review of systems sheet was reviewed with the patient.  Pertinent positives are:  EYES:  She wears glasses.  CARDIOVASCULAR: She notes leg pain with walking, left greater than right.  GASTROINTESTINAL: She has a hyper-gag reflex.  MUSCULOSKELETAL:  She notes back pain, leg pain, arthritis, and that her left leg hurts her more than her right leg. ENDOCRINE:  She notes thyroid disease.  All other systems are negative.  PAST MEDICAL HISTORY:  Hypothyroidism.  PAST SURGICAL HISTORY/HOSPITALIZATIONS: 1. Hysterectomy. 2. Cholecystectomy. 3. Corrective eye surgery.  CURRENT MEDICATIONS: 1. Premarin 0.625 mg q.d. 2. Synthroid 0.1 mg q.d. 3. Naprosyn 500 mg q.d.  ALLERGIES:  No known drug allergies.  Height and weight:  She is  5 feet 5-1/2 inches tall, weighing 175 pounds.  FAMILY HISTORY:  Her mother  is age 5.  She is obese, in fair health and has had a minor stroke.  Her father is deceased secondary to heart disease, high cholesterol, and high blood pressure.  SOCIAL HISTORY:  Julie Jefferson is married.  Her husband has cancer, and is on disability.  He is a five-year survivor of lung cancer and renal cell cancer. She is a nonsmoker, nondrinker.  No history of substance abuse.  DIAGNOSTIC STUDIES:  As above.  PHYSICAL EXAMINATION:  GENERAL:  Julie Jefferson is a pleasant cooperative uncomfortable-appearing moderately-obese middle-aged white female.  HEENT:  Normocephalic, atraumatic.  Pupils equal, round, reactive to light. Extraocular muscles intact.  Sclerae white.  Conjunctivae pink.  Oropharynx benign.   Uvula midline.  NECK:  No masses, meningismus, deformities, tracheal deviation, jugular venous distention, or carotid bruits.  There is a normal cervical range of motion. Spurlings test is negative, without reproducible radicular pain on turning the patients  head to either side.  Lhermittes sign is not present with axial compression.  LUNGS:  There is normal respiratory effort with good intercostal function. The lungs are clear to auscultation.  There are no rales, rhonchi, or wheezes.  CARDIOVASCULAR:  The heart has a regular rate and rhythm to auscultation.  No murmurs are appreciated.  EXTREMITIES:  There is no extremity edema, clubbing, or cyanosis.  There are palpable pedal pulses.  ABDOMEN:  Soft, nontender.  No hepatosplenomegaly appreciated or masses. There are active bowel sounds, no guarding or rebound.  MUSCULOSKELETAL:  The patient is able to walk about the examination room with a normal casual gait.  She is able to stand on her heels and toes.  She has minimal midline low back pain to palpation and left-sided sciatic notch discomfort, negative on the right.  She is able to bend to within eight inches of the floor.  She does not appear to have significant scoliosis of the lumbar spine.  Straight leg raising is positive at 80 degrees on the left, negative on the right.  Negative Patricks test bilaterally.  NEUROLOGIC:  The patient is oriented to time, person, and place.  She has good recall of both recent and remote memory with normal attention span and concentration.  The patient speaks clear and fluent speech and exhibits normal language function and appropriate fund of knowledge.  Cranial nerve examination:  Pupils equal, round, reactive to light.  Extraocular movements are full.  Visual fields are full to confrontational testing.  Facial sensation and facial motor are intact and symmetric.  Hearing is intact to finger rub.  Palate is upgoing.  Shoulder shrug is  symmetric.  The tongue protrudes in the midline.  Motor examination:  Motor strength is 5/5 in the bilateral deltoids, biceps, triceps, hand grips, wrist extensors, and  interosseous.  In the lower extremities the motor strength is 5/5 in the hip flexion, extension, quadriceps, hand strength, plantar flexion, right extensor hallucis longus, right dorsiflexion, and 4+ in the left dorsiflexion, and 4- in the left extensor hallucis longus strength with 4 to 4- in the left gluteus strength.  Sensory examination:  Normal to light touch and pinprick sensation in the upper and lower extremities, with the exception of decreased pin sensation in the left S1 distribution.  Deep tendon reflexes 2 in the biceps, triceps, and brachialis, 2 at the knee, 2 at the ankles.  Great toes are downgoing to plantar stimulation.  Negative Hoffmans sign.  Cerebellar examination:  Normal coordination in the upper and lower extremities.  Normal rapid  alternating movements.  Romberg test is negative.  IMPRESSION/RECOMMENDATIONS:  Ms. Vannah Nadal is a 65 year old woman with significant left lower extremity pain and weakness in the L5 distribution. She has gluteus weakness, dorsiflexion, and extensor hallucis weakness on the left.  She has moderately positive straight leg raise.  She has marked degenerative disk disease at the L4-5 level, and a broad-based spondylitic disk protrusion with superimposed disk herniation as well as foraminal stenosis, secondary to ligamentous hypertrophy at the L4-5 level on the left.  I have recommended to Ms. Waight that she undergo a lumbar microdiskectomy at the L4-5 level on the left.  I reviewed the studies with the patient and went over the physical examination.  I reviewed surgical models and discussed the typical hospital course and operative and postoperative course, and the potential risks and benefits of surgery.  The risks of surgery were discussed n detail and include,  but are not limited to, the risks of anesthesia, blood loss and possibility of a hemorrhage, infection, damage to nerves, damage to blood vessels, injury to the lumbar nerve roots, causing either temporary or permanent leg pain, numbness, or weakness.  There is potential for spinal fluid leak with dural tear.  There is potential for post-laminectomy spondylolisthesis, recurrent disk ruptured quoted at 10%, failure to relieve the pain, worsening of the pain, and the need for further surgery.  The surgery was set up for October 10, 2000. DD:  10/10/00 TD:  10/10/00 Job: [id number]

## 2011-05-30 ENCOUNTER — Other Ambulatory Visit: Payer: Self-pay | Admitting: Cardiology

## 2011-06-11 ENCOUNTER — Encounter: Payer: Self-pay | Admitting: Cardiology

## 2011-09-05 ENCOUNTER — Telehealth: Payer: Self-pay | Admitting: *Deleted

## 2011-09-05 NOTE — Telephone Encounter (Signed)
Left message on voice mail - saw her regular MD yesterday & he wanted to change a med that Dr. Myrtis Ser had put her on, persistant cough.  Left message to return call.

## 2011-09-11 ENCOUNTER — Telehealth: Payer: Self-pay | Admitting: *Deleted

## 2011-09-11 LAB — CARDIAC PANEL(CRET KIN+CKTOT+MB+TROPI)
Relative Index: 4.2 — ABNORMAL HIGH
Troponin I: 0.23 — ABNORMAL HIGH

## 2011-09-11 LAB — BASIC METABOLIC PANEL
BUN: 11
GFR calc Af Amer: >60
GFR calc non Af Amer: >60
Potassium: 3.5
Sodium: 142

## 2011-09-11 LAB — CBC
MCHC: 34.4
MCV: 90.3
Platelets: 213
RBC: 3.75 — ABNORMAL LOW
WBC: 6.2

## 2011-09-11 LAB — LIPID PANEL
Cholesterol: 161
Triglycerides: 53
VLDL: 11

## 2011-09-11 LAB — TSH: TSH: 2.39

## 2011-09-11 NOTE — Telephone Encounter (Signed)
Pt notified and verbalized understanding.

## 2011-09-11 NOTE — Telephone Encounter (Signed)
Message taken care of on 09/11/11.

## 2011-09-11 NOTE — Telephone Encounter (Signed)
Pt left message asking for return call.  She states she saw primary MD this week b/c she has had persistent cough for a few weeks now. She states her wanted to change Ramipril to Amlodipine 5 mg daily. She wanted to make sure Dr. Myrtis Ser approved of this change.

## 2011-09-11 NOTE — Telephone Encounter (Signed)
It would be fine to change from Ramapril  To amlodipine

## 2011-09-13 NOTE — Telephone Encounter (Signed)
Message left on nurse's voicemail @9 :41am to please call. Nurse returned call and left message on patient's voicemail that call was returned.

## 2011-10-03 ENCOUNTER — Other Ambulatory Visit: Payer: Self-pay | Admitting: Cardiology

## 2011-10-05 ENCOUNTER — Other Ambulatory Visit: Payer: Self-pay | Admitting: Cardiology

## 2011-10-17 ENCOUNTER — Encounter: Payer: Self-pay | Admitting: Cardiology

## 2011-10-17 DIAGNOSIS — E039 Hypothyroidism, unspecified: Secondary | ICD-10-CM | POA: Insufficient documentation

## 2011-10-17 DIAGNOSIS — K219 Gastro-esophageal reflux disease without esophagitis: Secondary | ICD-10-CM | POA: Insufficient documentation

## 2011-10-17 DIAGNOSIS — R9430 Abnormal result of cardiovascular function study, unspecified: Secondary | ICD-10-CM | POA: Insufficient documentation

## 2011-10-17 DIAGNOSIS — I5181 Takotsubo syndrome: Secondary | ICD-10-CM | POA: Insufficient documentation

## 2011-10-17 DIAGNOSIS — I10 Essential (primary) hypertension: Secondary | ICD-10-CM | POA: Insufficient documentation

## 2011-10-17 DIAGNOSIS — E785 Hyperlipidemia, unspecified: Secondary | ICD-10-CM | POA: Insufficient documentation

## 2011-10-19 ENCOUNTER — Encounter: Payer: Self-pay | Admitting: *Deleted

## 2011-10-19 ENCOUNTER — Ambulatory Visit (INDEPENDENT_AMBULATORY_CARE_PROVIDER_SITE_OTHER): Payer: Medicare Other | Admitting: Cardiology

## 2011-10-19 DIAGNOSIS — I5181 Takotsubo syndrome: Secondary | ICD-10-CM

## 2011-10-19 DIAGNOSIS — R05 Cough: Secondary | ICD-10-CM

## 2011-10-19 DIAGNOSIS — R059 Cough, unspecified: Secondary | ICD-10-CM | POA: Insufficient documentation

## 2011-10-19 DIAGNOSIS — I10 Essential (primary) hypertension: Secondary | ICD-10-CM

## 2011-10-19 NOTE — Assessment & Plan Note (Signed)
Blood pressure is controlled. No change in therapy. 

## 2011-10-19 NOTE — Patient Instructions (Signed)
Your physician you to follow up in 1 year. You will receive a reminder letter in the mail one-two months in advance. If you don't receive a letter, please call our office to schedule the follow-up appointment. Your physician recommends that you continue on your current medications as directed. Please refer to the Current Medication list given to you today. 

## 2011-10-19 NOTE — Assessment & Plan Note (Signed)
She had a cough that appeared to benefit from an ACE inhibitor.  Meds have been changed and she feels better.  I'll see her back in one year for followup.

## 2011-10-19 NOTE — Progress Notes (Signed)
HPI Patient is seen today for cardiology followup.  I saw her last one year.  In 2008 she had a Takotsubo episode.Marland Kitchen  Her LV function improved.  She's had some GERD that is improved.  She also had a cough and her ACE inhibitor was changed to amlodipine.  The cough is gone and she feels well.  She's not had any chest pain.  No Known Allergies  Current Outpatient Prescriptions  Medication Sig Dispense Refill  . acetaminophen (TYLENOL ARTHRITIS PAIN) 650 MG CR tablet Take 650 mg by mouth every 8 (eight) hours as needed.        Marland Kitchen amLODipine (NORVASC) 5 MG tablet Take 1 tablet by mouth Daily.      Marland Kitchen aspirin 81 MG EC tablet Take 81 mg by mouth daily.        . bimatoprost (LUMIGAN) 0.03 % ophthalmic solution Place 1 drop into both eyes at bedtime.        . carvedilol (COREG) 3.125 MG tablet TAKE 1 TABLET BY MOUTH TWICE A DAY  60 tablet  6  . levothyroxine (SYNTHROID, LEVOTHROID) 100 MCG tablet Take 100 mcg by mouth daily.        . meclizine (ANTIVERT) 25 MG tablet Take 25 mg by mouth 3 (three) times daily as needed.        Marland Kitchen omeprazole (PRILOSEC) 40 MG capsule Take 40 mg by mouth daily.        . simvastatin (ZOCOR) 10 MG tablet TAKE 1 TABLET BY MOUTH AT BEDTIME  30 tablet  1  . timolol (TIMOPTIC-XR) 0.5 % ophthalmic gel-forming Place 1 drop into both eyes Daily.        History   Social History  . Marital Status: Married    Spouse Name: N/A    Number of Children: N/A  . Years of Education: N/A   Occupational History  . Not on file.   Social History Main Topics  . Smoking status: Never Smoker   . Smokeless tobacco: Never Used   Comment: tobacco use - no   . Alcohol Use: No  . Drug Use: No  . Sexually Active: Not on file   Other Topics Concern  . Not on file   Social History Narrative   Widowed, works full time, gets regular exercise.     Family History  Problem Relation Age of Onset  . Coronary artery disease      family hx    Past Medical History  Diagnosis Date  .  Takotsubo syndrome     cardiac cath 11/08. minimal coronary disease, anteroseptal akinesis with an EF 55%.  echo 2/09 EF 60-65% (LV function normalized)  . HTN (hypertension)   . HLD (hyperlipidemia)     mixed  . Hypothyroidism   . GERD (gastroesophageal reflux disease)   . Ejection fraction     EF 60-65%, echo, February, 2009, (LV function normalized).    Past Surgical History  Procedure Date  . Total abdominal hysterectomy   . Cholecystectomy   . Breast lumpectomy     ROS    Patient denies fever, chills, headache, sweats, rash, change in vision, change in hearing, chest pain, cough, nausea vomiting, urinary symptoms.  All other systems are reviewed and are negative.  PHYSICAL EXAM   He looks quite good today.  There is no jugular venous distention.  Lungs are clear.  Respiratory effort is unlabored.  Cardiac exam reveals S1 and S2.  No clicks or significant murmurs.  The abdomen is  soft.  There is no edema.  Filed Vitals:   10/19/11 0823  BP: 126/81  Pulse: 61  Height: 5\' 6"  (1.676 m)  Weight: 190 lb (86.183 kg)    EKG   EKG is done today and reviewed by me.  There is normal sinus rhythm no significant abnormality.  ASSESSMENT & PLAN

## 2011-10-19 NOTE — Assessment & Plan Note (Signed)
She has had no recurrent problems.  No further workup.

## 2011-12-03 ENCOUNTER — Other Ambulatory Visit: Payer: Self-pay | Admitting: Cardiology

## 2011-12-03 ENCOUNTER — Other Ambulatory Visit: Payer: Self-pay

## 2011-12-03 MED ORDER — SIMVASTATIN 10 MG PO TABS: 10.0000 mg | ORAL_TABLET | Freq: Every day | ORAL | Status: DC

## 2012-01-02 ENCOUNTER — Telehealth (INDEPENDENT_AMBULATORY_CARE_PROVIDER_SITE_OTHER): Payer: Self-pay | Admitting: Surgery

## 2012-01-02 ENCOUNTER — Ambulatory Visit (INDEPENDENT_AMBULATORY_CARE_PROVIDER_SITE_OTHER): Payer: Medicare Other | Admitting: Surgery

## 2012-01-02 ENCOUNTER — Encounter (INDEPENDENT_AMBULATORY_CARE_PROVIDER_SITE_OTHER): Payer: Self-pay | Admitting: Surgery

## 2012-01-02 VITALS — BP 122/76 | HR 82 | Temp 97.2°F | Resp 12 | Ht 65.5 in | Wt 189.6 lb

## 2012-01-02 DIAGNOSIS — N63 Unspecified lump in unspecified breast: Secondary | ICD-10-CM

## 2012-01-02 DIAGNOSIS — N6320 Unspecified lump in the left breast, unspecified quadrant: Secondary | ICD-10-CM | POA: Insufficient documentation

## 2012-01-02 NOTE — Progress Notes (Signed)
 Re:   Julie Jefferson DOB:   12/23/1945 MRN:   [id number]  ASSESSMENT AND PLAN: 1.  Left breast lump.  This lays laterally in her lumpectomy scar.  It appears to be 0.3 x 0.5 mm on US.  Though I think this is benign, I will plan to excise this under local anesthesia.  Discussed indications and complications with the patient.  2.  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  Left lumpectomy and left axillary node dissection - 03/09/2004.  She took Tamoxifen for about 6 years.   Med oncologist - Dr. Arthur Sleeper and Rad oncologist - Dr. M. Ali ([place], [place]).  3.  GERD. 4.  HTN. 5.  History of Takotsubo syndrome.  Has seen Dr. J. Katz.    Chief Complaint  Patient presents with  . Breast Cancer    pt feels knot at sx site from 2005   REFERRING PHYSICIAN: LEWIS,WILLIAM B, MD, MD  HISTORY OF PRESENT ILLNESS: Porsche C Whitelaw is a 66 y.o. (DOB: 08/07/1946)  white female whose primary care physician is LEWIS,WILLIAM B, MD, MD and comes to me today for nodule in her left breast.  The patient has not see me in about 5 years. Around December 2012 she noticed a nodule in the lateral aspect of her left breast incision. This nodule has not changed since she first noticed it.  Her last mammogram was 25 July 2011 [place], [place]. She is otherwise doing well from a health standpoint.   Past Medical History  Diagnosis Date  . Takotsubo syndrome     cardiac cath 11/08. minimal coronary disease, anteroseptal akinesis with an EF 55%.  echo 2/09 EF 60-65% (LV function normalized)  . HTN (hypertension)   . HLD (hyperlipidemia)     mixed  . Hypothyroidism   . GERD (gastroesophageal reflux disease)   . Ejection fraction     EF 60-65%, echo, February, 2009, (LV function normalized).  . Cough     ACE cough 2012  . History of breast cancer     left  . Arthritis   . Takotsubo cardiomyopathy       Past  Surgical History  Procedure Date  . Total abdominal hysterectomy   . Cholecystectomy   . Breast lumpectomy 2005    left breast  . Ganglion cyst excision     left  . Bunionectomy     left      Current Outpatient Prescriptions  Medication Sig Dispense Refill  . acetaminophen (TYLENOL ARTHRITIS PAIN) 650 MG CR tablet Take 650 mg by mouth every 8 (eight) hours as needed.        . amLODipine (NORVASC) 5 MG tablet Take 1 tablet by mouth Daily.      . aspirin 81 MG EC tablet Take 81 mg by mouth daily.        . bimatoprost (LUMIGAN) 0.03 % ophthalmic solution Place 1 drop into both eyes at bedtime.        . carvedilol (COREG) 3.125 MG tablet TAKE 1 TABLET BY MOUTH TWICE A DAY  60 tablet  6  . levothyroxine (SYNTHROID, LEVOTHROID) 100 MCG tablet Take 100 mcg by mouth daily.        . meclizine (ANTIVERT) 25 MG tablet Take 25 mg by mouth 3 (three) times daily as needed.        . omeprazole (PRILOSEC)   40 MG capsule Take 40 mg by mouth daily.        . simvastatin (ZOCOR) 10 MG tablet Take 1 tablet (10 mg total) by mouth at bedtime.  30 tablet  11  . timolol (TIMOPTIC-XR) 0.5 % ophthalmic gel-forming Place 1 drop into both eyes Daily.         No Known Allergies  REVIEW OF SYSTEMS: Skin:  No history of rash.  No history of abnormal moles. Infection:  No history of hepatitis or HIV.  No history of MRSA. Neurologic:  No history of stroke.  No history of seizure.  No history of headaches. Cardiac: Sees Dr. J. Katz for follow up of Takotsuba syndrome.  She is treated for HTN, though she says she does not have it. Pulmonary:  Does not smoke cigarettes.  No asthma or bronchitis.  No OSA/CPAP.  Endocrine:  No diabetes. No thyroid disease. Gastrointestinal:  GERD.  S/P cholecystectomy - 2000.   No history of liver disease.  No history of pancreas disease.  No history of colon disease. Urologic:  No history of kidney stones.  No history of bladder infections. Musculoskeletal:  Micro-diskectomy -  2002. Hematologic:  No bleeding disorder.  No history of anemia.  Not anticoagulated. Psycho-social:  The patient is oriented.   The patient has no obvious psychologic or social impairment to understanding our conversation and plan.  SOCIAL and FAMILY HISTORY: Come by self. Her daughter, Alyssa, works with case management at [employer].  PHYSICAL EXAM: BP 122/76  Pulse 82  Temp(Src) 97.2 F (36.2 C) (Temporal)  Resp 12  Ht 5' 5.5" (1.664 m)  Wt 189 lb 9.6 oz (86.002 kg)  BMI 31.07 kg/m2  General: WN WF who is alert and generally healthy appearing.  HEENT: Normal. Pupils equal. Good dentition. Neck: Supple. No mass.  No thyroid mass.  Carotid pulse okay with no bruit. Lymph Nodes:  No supraclavicular, cervical nodes, or axillary adenopathy. Lungs: Clear to auscultation and symmetric breath sounds. Heart:  RRR. No murmur or rub. Abdomen: Soft. No mass. No tenderness. No hernia. Breasts:  Right:  Negative.  Left:  Scar above nipple.  At 2 o'clock position, there is a 5 mm nodule immediately under the skin.  Normal bowel sounds.  No abdominal scars. Rectal: Not done. Extremities:  Good strength and ROM  in upper and lower extremities. Neurologic:  Grossly intact to motor and sensory function. Psychiatric: Has normal mood and affect. Behavior is normal.   Procedure:  US in office shows a 0.3 x 0.5 mm left breast nodule almost in the skin. DATA REVIEWED: Mammogram - Aug 2012 - negative.  Devinn Hurwitz, MD,  FACS [facility] [address]  [address]   [place], [place]    [address] Phone:  [phone] FAX:  [phone]  

## 2012-01-02 NOTE — Telephone Encounter (Signed)
February 20th at 12noon.

## 2012-01-02 NOTE — Telephone Encounter (Signed)
Patient notified

## 2012-01-03 ENCOUNTER — Ambulatory Visit (HOSPITAL_BASED_OUTPATIENT_CLINIC_OR_DEPARTMENT_OTHER)
Admission: RE | Admit: 2012-01-03 | Discharge: 2012-01-03 | Disposition: A | Discharge status: Home / Self Care | Payer: Medicare Other | Source: Ambulatory Visit | Attending: Surgery | Admitting: Surgery

## 2012-01-03 ENCOUNTER — Encounter (HOSPITAL_BASED_OUTPATIENT_CLINIC_OR_DEPARTMENT_OTHER)
Admission: RE | Disposition: A | Discharge status: Home / Self Care | Payer: Self-pay | Source: Ambulatory Visit | Attending: Surgery

## 2012-01-03 ENCOUNTER — Other Ambulatory Visit (INDEPENDENT_AMBULATORY_CARE_PROVIDER_SITE_OTHER): Payer: Self-pay | Admitting: Surgery

## 2012-01-03 DIAGNOSIS — Z853 Personal history of malignant neoplasm of breast: Secondary | ICD-10-CM | POA: Insufficient documentation

## 2012-01-03 DIAGNOSIS — D249 Benign neoplasm of unspecified breast: Secondary | ICD-10-CM

## 2012-01-03 DIAGNOSIS — I10 Essential (primary) hypertension: Secondary | ICD-10-CM | POA: Insufficient documentation

## 2012-01-03 DIAGNOSIS — N6320 Unspecified lump in the left breast, unspecified quadrant: Secondary | ICD-10-CM

## 2012-01-03 DIAGNOSIS — K219 Gastro-esophageal reflux disease without esophagitis: Secondary | ICD-10-CM | POA: Insufficient documentation

## 2012-01-03 HISTORY — PX: BREAST BIOPSY: SHX20

## 2012-01-03 SURGERY — MINOR BREAST BIOPSY
Anesthesia: LOCAL | Site: Breast | Laterality: Left | Wound class: Clean

## 2012-01-03 MED ORDER — LIDOCAINE-EPINEPHRINE 1 %-1:100000 IJ SOLN
INTRAMUSCULAR | Status: DC | PRN
  Administered 2012-01-03: 10 mL

## 2012-01-03 SURGICAL SUPPLY — 37 items
ADH SKN CLS APL DERMABOND .7 (GAUZE/BANDAGES/DRESSINGS) ×1
APL SKNCLS STERI-STRIP NONHPOA (GAUZE/BANDAGES/DRESSINGS)
BANDAGE ADHESIVE 1X3 (GAUZE/BANDAGES/DRESSINGS) IMPLANT
BENZOIN TINCTURE PRP APPL 2/3 (GAUZE/BANDAGES/DRESSINGS) IMPLANT
BLADE SURG 15 STRL LF DISP TIS (BLADE) ×1 IMPLANT
BLADE SURG 15 STRL SS (BLADE) ×2
CHLORAPREP W/TINT 26ML (MISCELLANEOUS) IMPLANT
CLOTH BEACON ORANGE TIMEOUT ST (SAFETY) ×2 IMPLANT
DERMABOND ADVANCED (GAUZE/BANDAGES/DRESSINGS) ×1
DERMABOND ADVANCED .7 DNX12 (GAUZE/BANDAGES/DRESSINGS) ×1 IMPLANT
DRAPE UTILITY XL STRL (DRAPES) ×1 IMPLANT
ELECT COATED BLADE 2.86 ST (ELECTRODE) IMPLANT
GAUZE SPONGE 4X4 12PLY STRL LF (GAUZE/BANDAGES/DRESSINGS) IMPLANT
GLOVE BIO SURGEON STRL SZ 6.5 (GLOVE) ×1 IMPLANT
GLOVE SURG SIGNA 7.5 PF LTX (GLOVE) ×2 IMPLANT
MARKER SKIN DUAL TIP RULER LAB (MISCELLANEOUS) ×2 IMPLANT
NDL HYPO 25X1 1.5 SAFETY (NEEDLE) IMPLANT
NDL HYPO 25X5/8 SAFETYGLIDE (NEEDLE) ×1 IMPLANT
NDL SAFETY ECLIPSE 18X1.5 (NEEDLE) ×1 IMPLANT
NEEDLE HYPO 18GX1.5 SHARP (NEEDLE) ×2
NEEDLE HYPO 25X1 1.5 SAFETY (NEEDLE) ×2 IMPLANT
NEEDLE HYPO 25X5/8 SAFETYGLIDE (NEEDLE) IMPLANT
NS IRRIG 1000ML POUR BTL (IV SOLUTION) ×1 IMPLANT
STRIP CLOSURE SKIN 1/2X4 (GAUZE/BANDAGES/DRESSINGS) IMPLANT
STRIP CLOSURE SKIN 1/4X4 (GAUZE/BANDAGES/DRESSINGS) IMPLANT
SUT ETHILON 3 0 PS 1 (SUTURE) IMPLANT
SUT ETHILON 4 0 PS 2 18 (SUTURE) IMPLANT
SUT ETHILON 5 0 P 3 18 (SUTURE)
SUT NYLON ETHILON 5-0 P-3 1X18 (SUTURE) IMPLANT
SUT VIC AB 5-0 PC1 18 (SUTURE) IMPLANT
SUT VIC AB 5-0 PS2 18 (SUTURE) IMPLANT
SUT VICRYL 3-0 CR8 SH (SUTURE) ×1 IMPLANT
SUT VICRYL 4-0 PS2 18IN ABS (SUTURE) IMPLANT
SWABSTICK POVIDONE IODINE SNGL (MISCELLANEOUS) ×5 IMPLANT
SYR CONTROL 10ML LL (SYRINGE) ×2 IMPLANT
TOWEL OR NON WOVEN STRL DISP B (DISPOSABLE) ×2 IMPLANT
UNDERPAD 30X30 INCONTINENT (UNDERPADS AND DIAPERS) IMPLANT

## 2012-01-03 NOTE — Interval H&P Note (Signed)
History and Physical Interval Note:  01/03/2012 3:48 PM  Julie Jefferson  has presented today for surgery, with the diagnosis of left breast mass  The various methods of treatment have been discussed with the patient and family. There is a small nodule in the lateral scar of her left breast.  Plan excision.  After consideration of risks, benefits and other options for treatment, the patient has consented to  Procedure(s):  Left BREAST BIOPSY as a surgical intervention .    The patients' history has been reviewed, patient examined, no change in status, stable for surgery.  I have reviewed the patients' chart and labs.  Questions were answered to the patient's satisfaction.     Julie Jefferson

## 2012-01-03 NOTE — Brief Op Note (Signed)
01/03/2012  4:38 PM  PATIENT:  Julie Jefferson, 66 y.o., female, MRN: [id number]  PREOP DIAGNOSIS:  left breast mass  POSTOP DIAGNOSIS:   Left breast mass in scar of prior lumpectomy, 2 o'clock position  PROCEDURE:   Procedure(s):  Left BREAST BIOPSY  SURGEON:   Ovidio Kin, M.D.  ASSISTANT:   None  ANESTHESIA:   local  * No anesthesia staff entered *  Local  EBL:  minimal  ml  BLOOD ADMINISTERED: none  DRAINS: none   LOCAL MEDICATIONS USED:   10 cc 1% xylocaine  SPECIMEN:   Left breast/skin  COUNTS CORRECT:  YES  INDICATIONS FOR PROCEDURE:  Julie Jefferson is a 66 y.o. (DOB: Aug 22, 1946) white female whose primary care physician is Theodoro Kos, MD, MD and comes for left breast/skin biopsy.   The indications and risks of the surgery were explained to the patient.  The risks include, but are not limited to, infection, bleeding, and nerve injury.  Note dictated to:   #[id number]  DN

## 2012-01-03 NOTE — H&P (View-Only) (Signed)
Re:   Julie Jefferson DOB:   December 20, 1945 MRN:   [id number]  ASSESSMENT AND PLAN: 1.  Left breast lump.  This lays laterally in her lumpectomy scar.  It appears to be 0.3 x 0.5 mm on [place].  Though I think this is benign, I will plan to excise this under local anesthesia.  Discussed indications and complications with the patient.  2.  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  Left lumpectomy and left axillary node dissection - 03/09/2004.  She took Tamoxifen for about 6 years.   Med oncologist - Dr. Tobie Poet and Rad oncologist - Dr. Sheliah Plane [place], [place]).  3.  GERD. 4.  HTN. 5.  History of Takotsubo syndrome.  Has seen Dr. Lovena Neighbours.    Chief Complaint  Patient presents with  . Breast Cancer    pt feels knot at sx site from 2005   REFERRING PHYSICIAN: Theodoro Kos, MD, MD  HISTORY OF PRESENT ILLNESS: Julie Jefferson is a 66 y.o. (DOB: [birth year])  white female whose primary care physician is LEWIS,WILLIAM B, MD, MD and comes to me today for nodule in her left breast.  The patient has not see me in about 5 years. Around December 2012 she noticed a nodule in the lateral aspect of her left breast incision. This nodule has not changed since she first noticed it.  Her last mammogram was 25 July 2011 [place], [place]. She is otherwise doing well from a health standpoint.   Past Medical History  Diagnosis Date  . Takotsubo syndrome     cardiac cath 11/08. minimal coronary disease, anteroseptal akinesis with an EF 55%.  echo 2/09 EF 60-65% (LV function normalized)  . HTN (hypertension)   . HLD (hyperlipidemia)     mixed  . Hypothyroidism   . GERD (gastroesophageal reflux disease)   . Ejection fraction     EF 60-65%, echo, February, 2009, (LV function normalized).  . Cough     ACE cough 2012  . History of breast cancer     left  . Arthritis   . Takotsubo cardiomyopathy       Past  Surgical History  Procedure Date  . Total abdominal hysterectomy   . Cholecystectomy   . Breast lumpectomy 2005    left breast  . Ganglion cyst excision     left  . Bunionectomy     left      Current Outpatient Prescriptions  Medication Sig Dispense Refill  . acetaminophen (TYLENOL ARTHRITIS PAIN) 650 MG CR tablet Take 650 mg by mouth every 8 (eight) hours as needed.        Marland Kitchen amLODipine (NORVASC) 5 MG tablet Take 1 tablet by mouth Daily.      Marland Kitchen aspirin 81 MG EC tablet Take 81 mg by mouth daily.        . bimatoprost (LUMIGAN) 0.03 % ophthalmic solution Place 1 drop into both eyes at bedtime.        . carvedilol (COREG) 3.125 MG tablet TAKE 1 TABLET BY MOUTH TWICE A DAY  60 tablet  6  . levothyroxine (SYNTHROID, LEVOTHROID) 100 MCG tablet Take 100 mcg by mouth daily.        . meclizine (ANTIVERT) 25 MG tablet Take 25 mg by mouth 3 (three) times daily as needed.        Marland Kitchen omeprazole (PRILOSEC)  40 MG capsule Take 40 mg by mouth daily.        . simvastatin (ZOCOR) 10 MG tablet Take 1 tablet (10 mg total) by mouth at bedtime.  30 tablet  11  . timolol (TIMOPTIC-XR) 0.5 % ophthalmic gel-forming Place 1 drop into both eyes Daily.         No Known Allergies  REVIEW OF SYSTEMS: Skin:  No history of rash.  No history of abnormal moles. Infection:  No history of hepatitis or HIV.  No history of MRSA. Neurologic:  No history of stroke.  No history of seizure.  No history of headaches. Cardiac: Sees Dr. Lovena Neighbours for follow up of Takotsuba syndrome.  She is treated for HTN, though she says she does not have it. Pulmonary:  Does not smoke cigarettes.  No asthma or bronchitis.  No OSA/CPAP.  Endocrine:  No diabetes. No thyroid disease. Gastrointestinal:  GERD.  S/P cholecystectomy - 2000.   No history of liver disease.  No history of pancreas disease.  No history of colon disease. Urologic:  No history of kidney stones.  No history of bladder infections. Musculoskeletal:  Micro-diskectomy -  2002. Hematologic:  No bleeding disorder.  No history of anemia.  Not anticoagulated. Psycho-social:  The patient is oriented.   The patient has no obvious psychologic or social impairment to understanding our conversation and plan.  SOCIAL and FAMILY HISTORY: Come by self. Her daughter, Arlyss Repress, works with case management at [employer].  PHYSICAL EXAM: BP 122/76  Pulse 82  Temp(Src) 97.2 F (36.2 C) (Temporal)  Resp 12  Ht 5' 5.5" (1.664 m)  Wt 189 lb 9.6 oz (86.002 kg)  BMI 31.07 kg/m2  General: WN WF who is alert and generally healthy appearing.  HEENT: Normal. Pupils equal. Good dentition. Neck: Supple. No mass.  No thyroid mass.  Carotid pulse okay with no bruit. Lymph Nodes:  No supraclavicular, cervical nodes, or axillary adenopathy. Lungs: Clear to auscultation and symmetric breath sounds. Heart:  RRR. No murmur or rub. Abdomen: Soft. No mass. No tenderness. No hernia. Breasts:  Right:  Negative.  Left:  Scar above nipple.  At 2 o'clock position, there is a 5 mm nodule immediately under the skin.  Normal bowel sounds.  No abdominal scars. Rectal: Not done. Extremities:  Good strength and ROM  in upper and lower extremities. Neurologic:  Grossly intact to motor and sensory function. Psychiatric: Has normal mood and affect. Behavior is normal.   Procedure:  [place] in office shows a 0.3 x 0.5 mm left breast nodule almost in the skin. DATA REVIEWED: Mammogram - Aug 2012 - negative.  Ovidio Kin, MD,  [facility] [address] [place]  [address]   [place], [place]    [address] Phone:  [phone] FAX:  [phone]

## 2012-01-03 NOTE — Op Note (Signed)
Julie Jefferson, HAVEY NO.:  [id number]  MEDICAL RECORD NO.:  [id number]  LOCATION:                                 FACILITY:  PHYSICIAN:  Sandria Bales. Ezzard Standing, M.D.  DATE OF BIRTH:  1946-05-30  DATE OF PROCEDURE:  01/03/2012                              OPERATIVE REPORT  PREOPERATIVE DIAGNOSES:  Nodule in the left breast, scar at 2 o'clock position.  POSTOPERATIVE DIAGNOSES:  Nodule in left breast, scar at 2 o'clock position.  PROCEDURE:  Left breast biopsy.  SURGEON:  Sandria Bales. Ezzard Standing, M.D.  FIRST ASSISTANT:  None.  ANESTHESIA:  Local approximately 10 mL of a 1% Xylocaine with epinephrine.  COMPLICATIONS:  None.  INDICATION FOR PROCEDURE:  [name] is a 66 year old white female who sees Dr. Melvyn Neth in [place] as her primary care doctor, has seen Dr. Tobie Poet for history of breast cancer.  She had a left breast cancer treated with lumpectomy and left axillary node dissection in April 2005, has been disease-free and done well.  She noticed a nodule in her scar about the 2 o'clock position.  This nodule appears to be benign.    I think she is best served by having it excised.  The indication and risk of surgery explained to the patient.  OPERATIVE NOTE: The patient was taken to the operating room where her left breast was prepped with Betadine and sterilely draped under local anesthetic used about 10 mL.  I infiltrated the skin around this nodule, which is about 5 mm in diameter.  I excised the skin and the underlying fat under the skin.  This has a benign appearance and was sent it to Pathology.    I did not try to orient or mark the specimen because it is so small.  The wound was irrigated.  The subcutaneous tissue was closed with 3-0 Vicryl suture, the skin closed with a 5-0 Monocryl, suture painted with Dermabond.    The patient tolerated the procedure well, will be discharged to go home today.  She will call and will check with me next week.   I will see her back in about 3 weeks for wound check.   Sandria Bales. Ezzard Standing, M.D., FACS   DHN/MEDQ  D:  01/03/2012  T:  01/03/2012  Job:  [id number]  cc:   Kimberlee Nearing Melvyn Neth, MD Tobie Poet, MD

## 2012-01-04 ENCOUNTER — Encounter (INDEPENDENT_AMBULATORY_CARE_PROVIDER_SITE_OTHER): Payer: Self-pay

## 2012-01-18 ENCOUNTER — Encounter (INDEPENDENT_AMBULATORY_CARE_PROVIDER_SITE_OTHER): Payer: Self-pay

## 2012-01-23 ENCOUNTER — Encounter (INDEPENDENT_AMBULATORY_CARE_PROVIDER_SITE_OTHER): Payer: Self-pay | Admitting: Surgery

## 2012-01-23 ENCOUNTER — Ambulatory Visit (INDEPENDENT_AMBULATORY_CARE_PROVIDER_SITE_OTHER): Payer: Medicare Other | Admitting: Surgery

## 2012-01-23 VITALS — BP 118/76 | HR 62 | Temp 97.9°F | Resp 18 | Ht 66.0 in | Wt 190.6 lb

## 2012-01-23 DIAGNOSIS — N6320 Unspecified lump in the left breast, unspecified quadrant: Secondary | ICD-10-CM

## 2012-01-23 DIAGNOSIS — N63 Unspecified lump in unspecified breast: Secondary | ICD-10-CM

## 2012-01-23 NOTE — Progress Notes (Signed)
Re:   Julie Jefferson DOB:   03-10-1946 MRN:   [id number]  ASSESSMENT AND PLAN: 1.  Left breast lump.    Benign on biopsy.  Copy of path to patient.  Return appointment in 1 year.  2.  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  Left lumpectomy and left axillary node dissection - 03/09/2004.  She took Tamoxifen for about 6 years.   Med oncologist - Dr. Tobie Poet and Rad oncologist - Dr. Sheliah Plane [place], [place]). 3.  GERD. 4.  HTN. 5.  History of Takotsubo syndrome.  Has seen Dr. Lovena Neighbours.  Chief Complaint  Patient presents with  . Routine Post Op    Breast biopsy 01/03/12   REFERRING PHYSICIAN: Theodoro Kos, MD, MD  HISTORY OF PRESENT ILLNESS: Julie Jefferson is a 66 y.o. (DOB: [birth year])  white female whose primary care physician is Theodoro Kos, MD, MD and comes to me today for follow up of left breast biopsy on 01/03/2012.  She has done well.  No complaints.  Her last mammogram was 25 July 2011 [place], [place]. She is otherwise doing well from a health standpoint.   Past Medical History  Diagnosis Date  . Takotsubo syndrome     cardiac cath 11/08. minimal coronary disease, anteroseptal akinesis with an EF 55%.  echo 2/09 EF 60-65% (LV function normalized)  . HTN (hypertension)   . HLD (hyperlipidemia)     mixed  . Hypothyroidism   . GERD (gastroesophageal reflux disease)   . Ejection fraction     EF 60-65%, echo, February, 2009, (LV function normalized).  . Cough     ACE cough 2012  . History of breast cancer     left  . Arthritis   . Takotsubo cardiomyopathy       Current Outpatient Prescriptions  Medication Sig Dispense Refill  . acetaminophen (TYLENOL ARTHRITIS PAIN) 650 MG CR tablet Take 650 mg by mouth every 8 (eight) hours as needed.        Marland Kitchen amLODipine (NORVASC) 5 MG tablet Take 1 tablet by mouth Daily.      Marland Kitchen aspirin 81 MG EC tablet Take 81 mg by mouth  daily.        . bimatoprost (LUMIGAN) 0.03 % ophthalmic solution Place 1 drop into both eyes at bedtime.        . carvedilol (COREG) 3.125 MG tablet TAKE 1 TABLET BY MOUTH TWICE A DAY  60 tablet  6  . levothyroxine (SYNTHROID, LEVOTHROID) 100 MCG tablet Take 100 mcg by mouth daily.        . meclizine (ANTIVERT) 25 MG tablet Take 25 mg by mouth 3 (three) times daily as needed.        Marland Kitchen omeprazole (PRILOSEC) 40 MG capsule Take 40 mg by mouth daily.        . simvastatin (ZOCOR) 10 MG tablet Take 1 tablet (10 mg total) by mouth at bedtime.  30 tablet  11  . timolol (TIMOPTIC-XR) 0.5 % ophthalmic gel-forming Place 1 drop into both eyes Daily.         No Known Allergies  REVIEW OF SYSTEMS: Cardiac: Sees Dr. Lovena Neighbours for follow up of Takotsuba syndrome.  She is treated for HTN, though she says she does not have it. Gastrointestinal:  GERD.  S/P cholecystectomy - 2000.   No history of liver disease.  No history of pancreas disease.  No history of colon disease. Musculoskeletal:  Micro-diskectomy - 2002.  SOCIAL and FAMILY HISTORY: Come by self. Her daughter, Arlyss Repress, works with case management at [employer].  PHYSICAL EXAM: BP 118/76  Pulse 62  Temp(Src) 97.9 F (36.6 C) (Temporal)  Resp 18  Ht 5\' 6"  (1.676 m)  Wt 190 lb 9.6 oz (86.456 kg)  BMI 30.76 kg/m2  General: WN WF who is alert and generally healthy appearing.  Breasts:  Right:  Negative.  Left:  Scar above nipple.  The incision along the lateral part of the incision is healed.  DATA REVIEWED: Path - benign - copy to patient.  Ovidio Kin, MD,  [facility] [address] [place]  [address]   [place], [place]    [address] Phone:  [phone] FAX:  [phone]

## 2012-05-16 ENCOUNTER — Other Ambulatory Visit: Payer: Self-pay | Admitting: Cardiology

## 2012-12-24 ENCOUNTER — Other Ambulatory Visit: Payer: Self-pay

## 2012-12-24 MED ORDER — CARVEDILOL 3.125 MG PO TABS: 3.1250 mg | ORAL_TABLET | Freq: Two times a day (BID) | ORAL | Status: DC

## 2013-01-05 ENCOUNTER — Telehealth (INDEPENDENT_AMBULATORY_CARE_PROVIDER_SITE_OTHER): Payer: Self-pay

## 2013-01-05 NOTE — Telephone Encounter (Signed)
Pt aware of appt 02/12/13 @5p 

## 2013-01-06 ENCOUNTER — Other Ambulatory Visit: Payer: Self-pay

## 2013-01-06 MED ORDER — SIMVASTATIN 10 MG PO TABS: 10.0000 mg | ORAL_TABLET | Freq: Every day | ORAL | Status: DC

## 2013-02-04 ENCOUNTER — Telehealth: Payer: Self-pay | Admitting: Cardiology

## 2013-02-04 NOTE — Telephone Encounter (Signed)
Patient is requesting a sooner appointment than 02/16/13 for c/o tightening in chest while walking on incline Monday, rated 4-5 an said it lasted about 5 minutes. Patient said she had one small episode today of chest tightness that lasted few seconds rated less than 4. No c/o sob or dizziness.  Patient advised that we can get her in to be seen sooner with PA. Patient declined and said she prefer to wait and see Dr. Myrtis Ser. Nurse informed patient that if her symptoms got worse or persist, she needed to go to ED for evaluation. Patient verbalized understanding of plan.

## 2013-02-04 NOTE — Telephone Encounter (Signed)
Tightness in chest when walking Monday March 3rd.  She was walking in snow up a hill. Would like to know if she should be seen earlier.

## 2013-02-11 ENCOUNTER — Encounter (INDEPENDENT_AMBULATORY_CARE_PROVIDER_SITE_OTHER): Payer: Medicare Other | Admitting: Surgery

## 2013-02-12 ENCOUNTER — Encounter (INDEPENDENT_AMBULATORY_CARE_PROVIDER_SITE_OTHER): Payer: Self-pay | Admitting: Surgery

## 2013-02-12 ENCOUNTER — Ambulatory Visit (INDEPENDENT_AMBULATORY_CARE_PROVIDER_SITE_OTHER): Payer: Medicare Other | Admitting: Surgery

## 2013-02-12 VITALS — BP 100/70 | HR 60 | Temp 98.9°F | Resp 12 | Ht 66.0 in | Wt 188.4 lb

## 2013-02-12 NOTE — Progress Notes (Signed)
Re:   Julie Jefferson DOB:   01/26/1946 MRN:   [id number]  ASSESSMENT AND PLAN: 1.  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  Left lumpectomy and left axillary node dissection - 03/09/2004.  She took Tamoxifen for about 6 years.   Med oncologist - Dr. Tobie Poet and Rad oncologist - Dr. Sheliah Plane [place], [place]).  She is disease free.  I will see her back in 1 year.  2.  Left breast lump - Benign on biopsy. 01/03/2012 - D. Newman 3.  GERD. 4.  HTN. 5.  History of Takotsubo syndrome.  Has seen Dr. Lovena Neighbours. 6.  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  Chief Complaint  Patient presents with  . Follow-up   REFERRING PHYSICIAN: Theodoro Kos, MD  HISTORY OF PRESENT ILLNESS: Julie Jefferson is a 67 y.o. (DOB: 01/22/1946)  white female whose primary care physician is LEWIS,WILLIAM B, MD and comes to me today for follow up of left breast cancer.  She is now out almost 9 years.  She is in good spiritis.  She has done well.  She is having some cysts of her liver and kidney looked at.   Past Medical History  Diagnosis Date  . Takotsubo syndrome     cardiac cath 11/08. minimal coronary disease, anteroseptal akinesis with an EF 55%.  echo 2/09 EF 60-65% (LV function normalized)  . HTN (hypertension)   . HLD (hyperlipidemia)     mixed  . Hypothyroidism   . GERD (gastroesophageal reflux disease)   . Ejection fraction     EF 60-65%, echo, February, 2009, (LV function normalized).  . Cough     ACE cough 2012  . History of breast cancer     left  . Arthritis   . Takotsubo cardiomyopathy       Current Outpatient Prescriptions  Medication Sig Dispense Refill  . acetaminophen (TYLENOL ARTHRITIS PAIN) 650 MG CR tablet Take 650 mg by mouth every 8 (eight) hours as needed.        Marland Kitchen amLODipine (NORVASC) 5 MG tablet Take 1 tablet by mouth Daily.      Marland Kitchen aspirin 81 MG EC tablet Take 81 mg by  mouth daily.        . bimatoprost (LUMIGAN) 0.03 % ophthalmic solution Place 1 drop into both eyes at bedtime.        . Brimonidine Tartrate (ALPHAGAN P OP) Apply 1 drop to eye 2 (two) times daily.      . carvedilol (COREG) 3.125 MG tablet Take 1 tablet (3.125 mg total) by mouth 2 (two) times daily with a meal.  60 tablet  4  . DORZOLAMIDE HCL-TIMOLOL MAL OP Apply 1 drop to eye daily.      Marland Kitchen levothyroxine (SYNTHROID, LEVOTHROID) 100 MCG tablet Take 100 mcg by mouth daily.        . meclizine (ANTIVERT) 25 MG tablet Take 25 mg by mouth 3 (three) times daily as needed.        Marland Kitchen omeprazole (PRILOSEC) 40 MG capsule Take 40 mg by mouth daily.        . simvastatin (ZOCOR) 10 MG tablet Take 1 tablet (10 mg total) by mouth at bedtime.  45 tablet  0   No current facility-administered medications for this visit.     No Known Allergies  REVIEW OF SYSTEMS: Cardiac: Sees Dr. Lovena Neighbours for follow up of Takotsuba syndrome.  She is treated for HTN, though she says she does not have it. Gastrointestinal:  GERD.  S/P cholecystectomy - 2000.   No history of liver disease.  No history of pancreas disease.  No history of colon disease. Musculoskeletal:  Micro-diskectomy - 2002.  SOCIAL and FAMILY HISTORY: Come by self. Her daughter, Arlyss Repress, works with case management at [employer].  PHYSICAL EXAM: BP 100/70  Pulse 60  Temp(Src) 98.9 F (37.2 C) (Oral)  Resp 12  Ht 5\' 6"  (1.676 m)  Wt 188 lb 6 oz (85.446 kg)  BMI 30.42 kg/m2  General: WN WF who is alert and generally healthy appearing.  Breasts:  Right:  Negative.  Left:  Scar above nipple.  The incision along the lateral part of the incision is healed.  DATA REVIEWED: No new data.  She thinks she is to get her mammogram in April.  Ovidio Kin, MD,  [facility] [address] [place]  [address]   [place], [place]    [address] Phone:  [phone] FAX:  [phone]

## 2013-02-16 ENCOUNTER — Encounter: Payer: Self-pay | Admitting: Cardiology

## 2013-02-16 ENCOUNTER — Ambulatory Visit (INDEPENDENT_AMBULATORY_CARE_PROVIDER_SITE_OTHER): Payer: Medicare Other | Admitting: Cardiology

## 2013-02-16 VITALS — BP 131/87 | HR 66 | Ht 65.5 in | Wt 186.0 lb

## 2013-02-16 DIAGNOSIS — E785 Hyperlipidemia, unspecified: Secondary | ICD-10-CM

## 2013-02-16 DIAGNOSIS — I5181 Takotsubo syndrome: Secondary | ICD-10-CM

## 2013-02-16 DIAGNOSIS — IMO0002 Reserved for concepts with insufficient information to code with codable children: Secondary | ICD-10-CM

## 2013-02-16 DIAGNOSIS — R079 Chest pain, unspecified: Secondary | ICD-10-CM

## 2013-02-16 DIAGNOSIS — I10 Essential (primary) hypertension: Secondary | ICD-10-CM

## 2013-02-16 NOTE — Patient Instructions (Signed)
Continue all current medications. Follow up in  3 months 

## 2013-02-16 NOTE — Progress Notes (Signed)
HPI   Patient is seen for the evaluation of chest discomfort. In 2008 she has Takotubo event in 2008. She had minimal coronary disease at that time. She has some left ventricular dysfunction at that time. Followup echo showed that her LV function improved. She's been doing well. During one of the recent snowstorms she did walk outside and noticed some chest heaviness when walking up a hill. This resolved. Since then she's been active with no problems.  No Known Allergies  Current Outpatient Prescriptions  Medication Sig Dispense Refill  . acetaminophen (TYLENOL ARTHRITIS PAIN) 650 MG CR tablet Take 650 mg by mouth every 8 (eight) hours as needed.        Marland Kitchen amLODipine (NORVASC) 5 MG tablet Take 1 tablet by mouth Daily.      Marland Kitchen aspirin 81 MG EC tablet Take 81 mg by mouth daily.        . bimatoprost (LUMIGAN) 0.03 % ophthalmic solution Place 1 drop into both eyes at bedtime.        . Brimonidine Tartrate (ALPHAGAN P OP) Apply 1 drop to eye 2 (two) times daily.      . carvedilol (COREG) 3.125 MG tablet Take 1 tablet (3.125 mg total) by mouth 2 (two) times daily with a meal.  60 tablet  4  . DORZOLAMIDE HCL-TIMOLOL MAL OP Apply 1 drop to eye daily.      Marland Kitchen levothyroxine (SYNTHROID, LEVOTHROID) 100 MCG tablet Take 100 mcg by mouth daily.        . meclizine (ANTIVERT) 25 MG tablet Take 25 mg by mouth 3 (three) times daily as needed.        Marland Kitchen omeprazole (PRILOSEC) 40 MG capsule Take 40 mg by mouth daily.        . simvastatin (ZOCOR) 10 MG tablet Take 1 tablet (10 mg total) by mouth at bedtime.  45 tablet  0   No current facility-administered medications for this visit.    History   Social History  . Marital Status: Widowed    Spouse Name: N/A    Number of Children: N/A  . Years of Education: N/A   Occupational History  . Not on file.   Social History Main Topics  . Smoking status: Never Smoker   . Smokeless tobacco: Never Used     Comment: tobacco use - no   . Alcohol Use: No  . Drug  Use: No  . Sexually Active: Not on file   Other Topics Concern  . Not on file   Social History Narrative   Widowed, works full time, gets regular exercise.     Family History  Problem Relation Age of Onset  . Coronary artery disease      family hx  . Cancer Mother     breast  . Heart disease Father   . Cancer Sister     breast  . Cancer Maternal Aunt     breast    Past Medical History  Diagnosis Date  . Takotsubo syndrome     cardiac cath 11/08. minimal coronary disease, anteroseptal akinesis with an EF 55%.  echo 2/09 EF 60-65% (LV function normalized)  . HTN (hypertension)   . HLD (hyperlipidemia)     mixed  . Hypothyroidism   . GERD (gastroesophageal reflux disease)   . Ejection fraction     EF 60-65%, echo, February, 2009, (LV function normalized).  . Cough     ACE cough 2012  . History of breast cancer  left  . Arthritis   . Takotsubo cardiomyopathy     Past Surgical History  Procedure Laterality Date  . Cholecystectomy    . Breast lumpectomy  2005    left breast  . Ganglion cyst excision      left  . Bunionectomy      left  . Breast biopsy  01/03/2012  . Total abdominal hysterectomy  1997    Patient Active Problem List  Diagnosis  . 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 Takotsubo syndrome  . HTN (hypertension)  . HLD (hyperlipidemia)  . Hypothyroidism  . GERD (gastroesophageal reflux disease)  . Ejection fraction  . Cough  . Left breast lump    ROS    Patient denies fever, chills, headache, sweats, rash, change in vision, change in hearing, cough, nausea vomiting, urinary symptoms. All other systems are reviewed and are negative.  PHYSICAL EXAM   Patient is oriented to person time and place. Affect is normal. Lungs are clear. Respiratory effort is nonlabored. There is no jugular venous distention. Cardiac exam reveals S1 and S2. There no clicks or significant murmurs. The abdomen is soft. There is no peripheral edema.  There no musculoskeletal deformities. There are no skin rashes.  Filed Vitals:   02/16/13 1251  BP: 131/87  Pulse: 66  Height: 5' 5.5" (1.664 m)  Weight: 186 lb (84.369 kg)   EKG is done today and reviewed by me. There is sinus rhythm. There is no significant abnormality. There is no significant change.  ASSESSMENT & PLAN

## 2013-02-16 NOTE — Assessment & Plan Note (Signed)
Her lipids are being treated. No change in therapy. 

## 2013-02-16 NOTE — Assessment & Plan Note (Signed)
Blood pressure is controlled. No change in therapy. 

## 2013-02-16 NOTE — Assessment & Plan Note (Signed)
The patient had one single episode of slight chest heaviness when walking in the snow after. She had not been exercising much. I'm not convinced that this is a significant abnormality. She is return to normal activities. I plan no further testing at this time. However I plan to see her back in 3 months for followup. If she has any significant recurring symptoms she will be in touch.

## 2013-02-16 NOTE — Assessment & Plan Note (Signed)
This event occurred in 2008. Her LV function normalized. I do not think her recent brief symptoms or return of this problem.

## 2013-02-16 NOTE — Assessment & Plan Note (Signed)
Her ejection fraction had been reduced in the past related to her Vantin 2008. It normalized in 2009. I will consider followup echo when I see her next.

## 2013-02-25 ENCOUNTER — Other Ambulatory Visit: Payer: Self-pay

## 2013-02-25 MED ORDER — SIMVASTATIN 10 MG PO TABS: 10.0000 mg | ORAL_TABLET | Freq: Every day | ORAL | Status: DC

## 2013-04-20 ENCOUNTER — Other Ambulatory Visit: Payer: Medicare Other | Admitting: Lab

## 2013-04-20 ENCOUNTER — Ambulatory Visit (HOSPITAL_BASED_OUTPATIENT_CLINIC_OR_DEPARTMENT_OTHER): Payer: Medicare Other | Admitting: Genetic Counselor

## 2013-04-20 ENCOUNTER — Encounter: Payer: Self-pay | Admitting: Genetic Counselor

## 2013-04-20 DIAGNOSIS — C50919 Malignant neoplasm of unspecified site of unspecified female breast: Secondary | ICD-10-CM | POA: Insufficient documentation

## 2013-04-20 DIAGNOSIS — Z803 Family history of malignant neoplasm of breast: Secondary | ICD-10-CM

## 2013-04-20 DIAGNOSIS — C50912 Malignant neoplasm of unspecified site of left female breast: Secondary | ICD-10-CM

## 2013-04-20 NOTE — Progress Notes (Signed)
Dr. Melvyn Neth requested a cancer genetics consultation for Julie Jefferson, a 67 y.o. female, due to a personal and family history of cancer.  [name] presents to clinic today to discuss the possibility of a hereditary predisposition to cancer, genetic testing, and to further clarify her future cancer risks, as well as potential cancer risk for family members.   HISTORY OF PRESENT ILLNESS: [name] was diagnosed with left breast IDC at the age of 13. She is s/p lumpectomy, chemotherapy and radiation therapy. She has no history of other cancer.   Past Medical History  Diagnosis Date   Takotsubo syndrome     cardiac cath 11/08. minimal coronary disease, anteroseptal akinesis with an EF 55%.  echo 2/09 EF 60-65% (LV function normalized)   HTN (hypertension)    HLD (hyperlipidemia)     mixed   Hypothyroidism    GERD (gastroesophageal reflux disease)    Ejection fraction     EF 60-65%, echo, February, 2009, (LV function normalized).   Cough     ACE cough 2012   History of breast cancer     left   Arthritis    Takotsubo cardiomyopathy    Chest pain     February, 2014    Past Surgical History  Procedure Laterality Date   Cholecystectomy     Breast lumpectomy  2005    left breast   Ganglion cyst excision      left   Bunionectomy      left   Breast biopsy  01/03/2012   Total abdominal hysterectomy  1997   History   Social History   Marital Status: Widowed    Spouse Name: N/A    Number of Children: N/A   Years of Education: N/A   Social History Main Topics   Smoking status: Never Smoker    Smokeless tobacco: Never Used     Comment: tobacco use - no    Alcohol Use: No   Drug Use: No   Sexually Active: Not on file   Other Topics Concern   Not on file   Social History Narrative   Widowed, works full time, gets regular exercise.      FAMILY HISTORY:  During the visit, a 4-generation pedigree was obtained. Significant diagnoses include the  following:  Family History  Problem Relation Age of Onset   Coronary artery disease      family hx   Cancer Mother 78    breast   Heart disease Father    Cancer Sister     LCIS 36   Cancer Maternal Aunt 64    breast    Ms. Lazard ancestry is of Caucasian descent. There is no known Jewish ancestry or consanguinity.  GENETIC COUNSELING ASSESSMENT: [name] is a 67 y.o. female with a personal and family history of cancer, which includes breast cancer in several family members. Ms. Haglund family history is somewhat suggestive of a hereditary predisposition to cancer and we, therefore, discussed and recommended the following at today's visit.   DISCUSSION: We reviewed the characteristics, features and inheritance patterns of hereditary cancer syndromes. We also discussed genetic testing, including the appropriate family members to test, the process of testing, insurance coverage and turn-around-time for results. We recommended [name] pursue genetic testing for the breast and ovarian cancer gene panel at [facility].   PLAN: Ms. Robinette wished to pursue genetic testing and the blood sample will be sent to [facility] for analysis of the breast and ovarian cancer  gene panel.  We discussed the implications of a positive, negative and/ or variant of uncertain significance genetic test result. Results should be available within approximately 7 weeks time, at which point they will be disclosed by telephone to [name], as will any additional recommendations warranted by these results. [name] will receive a summary of her genetic counseling visit and a copy of her results once available. This information will also be available in Epic. We encouraged [name] to remain in contact with cancer genetics annually so that we can continuously update the family history and inform her of any changes in cancer genetics and testing that may be of benefit for this family. Ms.   Narducci questions were answered to her satisfaction today. Our contact information was provided should additional questions or concerns arise. Thank you for the referral and allowing [place] to share in the care of your patient.   The patient was seen for a total of 35 minutes, greater than 50% of which was spent face-to-face counseling.  This patient was discussed with Dr. Drue Second and she agrees with the above.    _______________________________________________________________________ For Office Staff:  Number of people involved in session: 2 Was an Intern/ student involved with case: not applicable

## 2013-05-11 DIAGNOSIS — R079 Chest pain, unspecified: Secondary | ICD-10-CM

## 2013-05-12 ENCOUNTER — Observation Stay (HOSPITAL_COMMUNITY)
Admission: RE | Admit: 2013-05-12 | Discharge: 2013-05-12 | Disposition: A | Discharge status: Home / Self Care | Payer: Medicare Other | Source: Other Acute Inpatient Hospital | Attending: Cardiology | Admitting: Cardiology

## 2013-05-12 ENCOUNTER — Encounter (HOSPITAL_COMMUNITY): Payer: Self-pay | Admitting: Family Medicine

## 2013-05-12 DIAGNOSIS — I5181 Takotsubo syndrome: Secondary | ICD-10-CM | POA: Diagnosis present

## 2013-05-12 DIAGNOSIS — R55 Syncope and collapse: Secondary | ICD-10-CM

## 2013-05-12 DIAGNOSIS — R072 Precordial pain: Secondary | ICD-10-CM | POA: Diagnosis present

## 2013-05-12 DIAGNOSIS — Z853 Personal history of malignant neoplasm of breast: Secondary | ICD-10-CM | POA: Insufficient documentation

## 2013-05-12 DIAGNOSIS — I252 Old myocardial infarction: Secondary | ICD-10-CM | POA: Insufficient documentation

## 2013-05-12 DIAGNOSIS — R079 Chest pain, unspecified: Secondary | ICD-10-CM

## 2013-05-12 DIAGNOSIS — I10 Essential (primary) hypertension: Secondary | ICD-10-CM | POA: Insufficient documentation

## 2013-05-12 DIAGNOSIS — Z79899 Other long term (current) drug therapy: Secondary | ICD-10-CM | POA: Insufficient documentation

## 2013-05-12 DIAGNOSIS — R570 Cardiogenic shock: Secondary | ICD-10-CM

## 2013-05-12 DIAGNOSIS — E782 Mixed hyperlipidemia: Secondary | ICD-10-CM | POA: Insufficient documentation

## 2013-05-12 LAB — BASIC METABOLIC PANEL
Calcium: 9.3 mg/dL (ref 8.4–10.5)
Creatinine, Ser: 0.77 mg/dL (ref 0.50–1.10)
GFR calc non Af Amer: 85 mL/min — ABNORMAL LOW (ref >90)
Sodium: 139 mEq/L (ref 135–145)

## 2013-05-12 LAB — CBC
Platelets: 257 10*3/uL (ref 150–400)
RBC: 4.13 MIL/uL (ref 3.87–5.11)
RDW: 13.7 % (ref 11.5–15.5)
WBC: 6 10*3/uL (ref 4.0–10.5)

## 2013-05-12 LAB — TROPONIN I
Troponin I: <0.3 ng/mL (ref <0.30)
Troponin I: <0.3 ng/mL (ref <0.30)

## 2013-05-12 MED ORDER — SIMVASTATIN 10 MG PO TABS
10.0000 mg | ORAL_TABLET | Freq: Every day | ORAL | Status: DC
  Filled 2013-05-12: qty 1

## 2013-05-12 MED ORDER — ENOXAPARIN SODIUM 40 MG/0.4ML ~~LOC~~ SOLN
40.0000 mg | SUBCUTANEOUS | Status: DC
Start: 2013-05-12 — End: 2013-05-12
  Filled 2013-05-12: qty 0.4

## 2013-05-12 MED ORDER — AMLODIPINE BESYLATE 5 MG PO TABS
5.0000 mg | ORAL_TABLET | Freq: Every day | ORAL | Status: DC
  Administered 2013-05-12: 5 mg via ORAL
  Filled 2013-05-12: qty 1

## 2013-05-12 MED ORDER — ONDANSETRON HCL 4 MG/2ML IJ SOLN: 4.0000 mg | Freq: Four times a day (QID) | INTRAMUSCULAR | Status: DC | PRN

## 2013-05-12 MED ORDER — ACETAMINOPHEN 325 MG PO TABS
650.0000 mg | ORAL_TABLET | ORAL | Status: DC | PRN
  Administered 2013-05-12: 650 mg via ORAL
  Filled 2013-05-12: qty 2

## 2013-05-12 MED ORDER — LEVOTHYROXINE SODIUM 100 MCG PO TABS
100.0000 ug | ORAL_TABLET | Freq: Every day | ORAL | Status: DC
  Administered 2013-05-12: 100 ug via ORAL
  Filled 2013-05-12: qty 1

## 2013-05-12 MED ORDER — PANTOPRAZOLE SODIUM 40 MG PO TBEC
40.0000 mg | DELAYED_RELEASE_TABLET | Freq: Every day | ORAL | Status: DC
  Administered 2013-05-12: 40 mg via ORAL
  Filled 2013-05-12: qty 1

## 2013-05-12 MED ORDER — ASPIRIN 81 MG PO CHEW: 324.0000 mg | CHEWABLE_TABLET | ORAL | Status: DC

## 2013-05-12 MED ORDER — ASPIRIN EC 81 MG PO TBEC: 81.0000 mg | DELAYED_RELEASE_TABLET | Freq: Every day | ORAL | Status: DC

## 2013-05-12 MED ORDER — ASPIRIN 300 MG RE SUPP
300.0000 mg | RECTAL | Status: DC
  Filled 2013-05-12: qty 1

## 2013-05-12 MED ORDER — NITROGLYCERIN 0.4 MG SL SUBL: 0.4000 mg | SUBLINGUAL_TABLET | SUBLINGUAL | Status: DC | PRN

## 2013-05-12 MED ORDER — CARVEDILOL 3.125 MG PO TABS
3.1250 mg | ORAL_TABLET | Freq: Two times a day (BID) | ORAL | Status: DC
  Administered 2013-05-12: 3.125 mg via ORAL
  Filled 2013-05-12 (×3): qty 1

## 2013-05-12 NOTE — Progress Notes (Signed)
Patient ID: Julie Jefferson, female   DOB: 1946-02-20, 67 y.o.   MRN: [id number]   Patient is doing well. I spoke with her and her daughter at length. She worked for a long time yesterday in the significant heat. I suspect that most of her symptoms were related to some volume depletion. Troponins are normal x2 so far. She will have a followup echo today to be sure that she has not had an unexpected second Takotsubo event. If her LV is okay and she ambulates after drinking significant volume here in the hospital, I may allow her to be discharged later today.  Jerral Bonito, MD

## 2013-05-12 NOTE — Progress Notes (Signed)
  Echocardiogram 2D Echocardiogram has been performed.  Julie Jefferson FRANCES 05/12/2013, 12:30 PM

## 2013-05-12 NOTE — Discharge Summary (Signed)
CARDIOLOGY DISCHARGE SUMMARY   Patient ID: LAURALEI CLOUSE MRN: [id number] DOB/AGE: Dec 21, 1945 67 y.o.  Admit date: 05/12/2013 Discharge date: 05/12/2013  Primary Discharge Diagnosis:   Near syncope - possible dehydration  Secondary Discharge Diagnosis:    Takotsubo syndrome   Precordial pain  Procedures: 2-D echocardiogram  Hospital Course: BREENA BEVACQUA is a 67 y.o. female with a history of MI associated with Takotsubo cardiomyopathy. She had done yard work for a prolonged period of time and then had a feeling of weakness, was diaphoretic and lightheaded. She also had some chest discomfort described as a pressure in association with this. She came to the hospital and was admitted for further evaluation and treatment.  Her cardiac enzymes were negative for MI. An echocardiogram was repeated which showed preserved EF and no regional wall motion abnormalities. Her previous wall motion abnormality from 2008 had resolved. She was hydrated, and her weakness improved. She was no longer having orthostatic symptoms. She had no further episodes of chest pain  On 05/12/2013, Ms. Winbush was seen by Dr. Myrtis Ser. She was ambulating without chest pain or shortness of breath and considered stable for discharge, to followup as an outpatient in [place].  Labs:   Lab Results  Component Value Date   WBC 6.0 05/12/2013   HGB 12.2 05/12/2013   HCT 36.0 05/12/2013   MCV 87.2 05/12/2013   PLT 257 05/12/2013     Recent Labs Lab 05/12/13 0535  NA 139  K 3.6  CL 104  CO2 24  BUN 15  CREATININE 0.77  CALCIUM 9.3  GLUCOSE 80    Recent Labs  05/12/13 0535 05/12/13 0915  TROPONINI <0.30 <0.30    EKG: 12-May-2013 03:58:00    Sinus bradycardia Otherwise normal ECG 93mm/s 18mm/mV 100Hz  8.0.1 12SL 241 HD CID: 1 Referred by: Unconfirmed Vent. rate 59 BPM PR interval 126 ms QRS duration 96 ms QT/QTc 476/471 ms P-R-T axes 57 54 35  Echo: 05/12/2013 Study Conclusions - Left ventricle: The  cavity size was normal. Wall thickness was normal. Systolic function was normal. The estimated ejection fraction was 60%, in the range of 60% to 65%. Wall motion was normal; there were no regional wall motion abnormalities. Left ventricular diastolic function parameters were normal. - Aortic valve: Trivial regurgitation.  FOLLOW UP PLANS AND APPOINTMENTS No Known Allergies   Medication List    TAKE these medications       amLODipine 5 MG tablet  Commonly known as:  NORVASC  Take 1 tablet by mouth Daily.     aspirin 81 MG EC tablet  Take 81 mg by mouth daily.     bimatoprost 0.03 % ophthalmic solution  Commonly known as:  LUMIGAN  Place 1 drop into both eyes at bedtime.     carvedilol 3.125 MG tablet  Commonly known as:  COREG  Take 1 tablet (3.125 mg total) by mouth 2 (two) times daily with a meal.     COMBIGAN 0.2-0.5 % ophthalmic solution  Generic drug:  brimonidine-timolol  Place 1 drop into both eyes 2 (two) times daily.     levothyroxine 100 MCG tablet  Commonly known as:  SYNTHROID, LEVOTHROID  Take 100 mcg by mouth daily.     meclizine 25 MG tablet  Commonly known as:  ANTIVERT  Take 25 mg by mouth 3 (three) times daily as needed.     omeprazole 40 MG capsule  Commonly known as:  PRILOSEC  Take 40 mg by mouth daily.  polyethylene glycol powder powder  Commonly known as:  GLYCOLAX/MIRALAX  Take 17 g by mouth daily.     simvastatin 10 MG tablet  Commonly known as:  ZOCOR  Take 1 tablet (10 mg total) by mouth at bedtime.     TYLENOL EX ST ARTHRITIS PAIN 500 MG tablet  Generic drug:  acetaminophen  Take 500 mg by mouth daily. Will take 500mg  during day if needed        Discharge Orders   Future Appointments Provider Department Dept Phone   05/25/2013 1:00 PM Luis Abed, MD [facility] ([facility] [place]) [phone]   Future Orders Complete By Expires     Diet - low sodium heart healthy  As directed     Increase activity slowly  As  directed       Follow-up Information   Follow up with Willa Rough, MD On 05/25/2013. (at 1:00 pm)    Contact information:   [address] [place], [place] [phone]       BRING ALL MEDICATIONS WITH YOU TO FOLLOW UP APPOINTMENTS  Time spent with patient to include physician time: 38 min Signed: Theodore Demark, PA-C 05/12/2013, 2:06 PM Patient seen and examined. I agree with the assessment and plan as detailed above. See also my additional thoughts below.  I completed a progress note in this record today. I made the decision for the patient to go home. I have reviewed all the information above. I spoke with the patient and her daughter in the room about going home.  Willa Rough, MD, [facility] 05/12/2013 3:33 PM

## 2013-05-12 NOTE — Progress Notes (Signed)
Patient ID: Julie Jefferson, female   DOB: 1946/01/24, 67 y.o.   MRN: [id number]   SUBJECTIVE:  Patient is feeling fine and hopes to go home. Troponins are normal. Two-dimensional echo is normal. Ejection fraction is 60%. There are no focal wall motion abnormalities.  I reviewed the history extensively with her and her daughter. I suspect that she became dehydrated while working out in the sun and then felt poorly later in the day. There is no proof of a cardiac event. She will be discharged home. I will see her back in our office in August.   Filed Vitals:   05/12/13 0230 05/12/13 0530 05/12/13 1312  BP: 152/69 123/74 113/54  Pulse: 60 57 71  Temp: 97.5 F (36.4 C) 97.8 F (36.6 C) 98.2 F (36.8 C)  TempSrc: Oral Oral Oral  Resp: 18 16 18   Height: 5\' 5"  (1.651 m)    Weight: 181 lb 10.5 oz (82.4 kg)    SpO2: 100% 95% 98%    Intake/Output Summary (Last 24 hours) at 05/12/13 1321 Last data filed at 05/12/13 1230  Gross per 24 hour  Intake    480 ml  Output    651 ml  Net   -171 ml    LABS: Basic Metabolic Panel:  Recent Labs  30/86/57 0535  NA 139  K 3.6  CL 104  CO2 24  GLUCOSE 80  BUN 15  CREATININE 0.77  CALCIUM 9.3   Liver Function Tests: No results found for this basename: AST, ALT, ALKPHOS, BILITOT, PROT, ALBUMIN,  in the last 72 hours No results found for this basename: LIPASE, AMYLASE,  in the last 72 hours CBC:  Recent Labs  05/12/13 0535  WBC 6.0  HGB 12.2  HCT 36.0  MCV 87.2  PLT 257   Cardiac Enzymes:  Recent Labs  05/12/13 0535 05/12/13 0915  TROPONINI <0.30 <0.30   BNP: No components found with this basename: POCBNP,  D-Dimer: No results found for this basename: DDIMER,  in the last 72 hours Hemoglobin A1C: No results found for this basename: HGBA1C,  in the last 72 hours Fasting Lipid Panel: No results found for this basename: CHOL, HDL, LDLCALC, TRIG, CHOLHDL, LDLDIRECT,  in the last 72 hours Thyroid Function Tests: No results  found for this basename: TSH, T4TOTAL, FREET3, T3FREE, THYROIDAB,  in the last 72 hours  RADIOLOGY: No results found.  PHYSICAL EXAM  Patient is oriented to person time and place. Affect is normal. Lungs are clear. Respiratory effort is nonlabored. Cardiac exam reveals S1 and S2. There no clicks or significant murmurs. The abdomen is soft. There is no peripheral edema.   TELEMETRY:  I have reviewed telemetry today May 12, 2013. There is normal sinus rhythm.   ASSESSMENT AND PLAN:   Hx  Takotsubo syndrome    This occurred in the past. There is no evidence of recurrence. LV function is completely normal now.    Chest pain     Her chest discomfort yesterday was vague. I believe was not cardiac in origin. No further workup.    Pre-syncope     I believe the patient felt poorly after working out for a prolonged period of time in the sun in her garden. I believe she was mildly dehydrated and felt poorly. She did not have true syncope. I will see her for followup. She will be in touch with me she has any recurrent symptoms. If so we will proceed with more aggressive workup that might  include Holter monitoring and the possibility of followup exercise testing. None of this is needed now.   Willa Rough 05/12/2013 1:21 PM

## 2013-05-12 NOTE — H&P (Signed)
History and Physical  Patient ID: Julie Jefferson MRN: [id number], SOB: January 04, 1946 67 y.o. Date of Encounter: 05/12/2013, 3:34 AM  Primary Physician: Theodoro Kos, MD Primary Cardiologist: Dr. Myrtis Ser  Chief Complaint: presyncope and chest pain  HPI: 67 y.o. female w/ PMHx significant for HTN, h/o Takotsubo cardiomyopathy with resolution of reduced EF who presented to [facility] on 05/12/2013 with complaints of an episode of presyncope followed by a prolonged period of chest heaviness.  Patient reports that she was in her normal state of health today and planted potatoes in her garden. However, on the evening prior to presentation, she went to a restaurant with her family and had the onset of feeling very weak, lightheaded and diaphoretic. No true loss of consciousness, she had to rest for resolution of symptoms. After the episode of pre-syncope she experienced several hours of chest discomfort. Pressure sensation. Eventually resolved on its on and she has remained chest pain free since.  No fever, chills, PND, orthopnea, LE swelling, cough.  EKG revealed NSR with no acute ST changes. CXR was without acute cardiopulmonary abnormalities. Labs are as follows: k 4.2 Na 135 Cr 0.98 Normal LFTs Troponin neg CKMB neg BNP normal D-dimer negative Wbc 6.1 hct 38 plts 261 inr 1.1.   Past Medical History  Diagnosis Date  . Takotsubo syndrome     cardiac cath 11/08. minimal coronary disease, anteroseptal akinesis with an EF 55%.  echo 2/09 EF 60-65% (LV function normalized)  . HTN (hypertension)   . HLD (hyperlipidemia)     mixed  . Hypothyroidism   . GERD (gastroesophageal reflux disease)   . Ejection fraction     EF 60-65%, echo, February, 2009, (LV function normalized).  . Cough     ACE cough 2012  . History of breast cancer     left  . Arthritis   . Takotsubo cardiomyopathy   . Chest pain     February, 2014     Surgical History:  Past Surgical History  Procedure  Laterality Date  . Cholecystectomy    . Breast lumpectomy  2005    left breast  . Ganglion cyst excision      left  . Bunionectomy      left  . Breast biopsy  01/03/2012  . Total abdominal hysterectomy  1997     Home Meds: Prior to Admission medications   Medication Sig Start Date End Date Taking? Authorizing Provider  acetaminophen (TYLENOL ARTHRITIS PAIN) 650 MG CR tablet Take 650 mg by mouth every 8 (eight) hours as needed.      Historical Provider, MD  amLODipine (NORVASC) 5 MG tablet Take 1 tablet by mouth Daily. 10/03/11   Historical Provider, MD  aspirin 81 MG EC tablet Take 81 mg by mouth daily.      Historical Provider, MD  bimatoprost (LUMIGAN) 0.03 % ophthalmic solution Place 1 drop into both eyes at bedtime.      Historical Provider, MD  Brimonidine Tartrate (ALPHAGAN P OP) Apply 1 drop to eye 2 (two) times daily.    Historical Provider, MD  carvedilol (COREG) 3.125 MG tablet Take 1 tablet (3.125 mg total) by mouth 2 (two) times daily with a meal. 12/24/12   Luis Abed, MD  DORZOLAMIDE HCL-TIMOLOL MAL OP Apply 1 drop to eye daily.    Historical Provider, MD  levothyroxine (SYNTHROID, LEVOTHROID) 100 MCG tablet Take 100 mcg by mouth daily.      Historical Provider, MD  meclizine (ANTIVERT) 25 MG tablet  Take 25 mg by mouth 3 (three) times daily as needed.      Historical Provider, MD  omeprazole (PRILOSEC) 40 MG capsule Take 40 mg by mouth daily.      Historical Provider, MD  simvastatin (ZOCOR) 10 MG tablet Take 1 tablet (10 mg total) by mouth at bedtime. 02/25/13   Luis Abed, MD    Allergies: No Known Allergies  History   Social History  . Marital Status: Widowed    Spouse Name: N/A    Number of Children: N/A  . Years of Education: N/A   Occupational History  . Not on file.   Social History Main Topics  . Smoking status: Never Smoker   . Smokeless tobacco: Never Used     Comment: tobacco use - no   . Alcohol Use: No  . Drug Use: No  . Sexually Active:  Not on file   Other Topics Concern  . Not on file   Social History Narrative   Widowed, works full time, gets regular exercise.      Family History  Problem Relation Age of Onset  . Coronary artery disease      family hx  . Cancer Mother 43    breast  . Heart disease Father   . Cancer Sister     LCIS 8  . Cancer Maternal Aunt 75    breast    Review of Systems: General: negative for chills, fever, night sweats or weight changes.  Cardiovascular: as per HPI Dermatological: negative for rash Respiratory: negative for cough or wheezing Urologic: negative for hematuria Abdominal: negative for nausea, vomiting, diarrhea, bright red blood per rectum, melena, or hematemesis Neurologic: negative for visual changes. All other systems reviewed and are otherwise negative except as noted above.  Labs:  see HPI  Radiology/Studies:  Cxray: normal.   ZOX:WRUEAV sinus rhythm  Physical Exam: Blood pressure 152/69, pulse 60, temperature 97.5 F (36.4 C), temperature source Oral, resp. rate 18, height 5\' 5"  (1.651 m), weight 82.4 kg (181 lb 10.5 oz), SpO2 100.00%. General: Well developed, well nourished, in no acute distress. Head: Normocephalic, atraumatic, sclera non-icteric, nares are without discharge Neck: Supple. Negative for carotid bruits. JVD not elevated. Lungs: Clear bilaterally to auscultation without wheezes, rales, or rhonchi. Breathing is unlabored. Heart: RRR with S1 S2. No murmurs, rubs, or gallops appreciated. Abdomen: Soft, non-tender, non-distended with normoactive bowel sounds. No rebound/guarding. No obvious abdominal masses. Msk:  Strength and tone appear normal for age. Extremities: No edema. No clubbing or cyanosis. Distal pedal pulses are 2+ and equal bilaterally. Neuro: Alert and oriented X 3. Moves all extremities spontaneously. Psych:  Responds to questions appropriately with a normal affect.   Problem List 1. Pre-syncope 2. Chest pain, typical and  atypical features 3. H/o Takotsubo cardiomyopathy in 2008, resolved by echo 2009 4. HTN 5. Hyperlipidemia  ASSESSMENT AND PLAN:  67 y.o. female w/ PMHx significant for HTN, h/o Takotsubo cardiomyopathy with resolution of reduced EF who presented to [facility] on 05/12/2013 with complaints of an episode of presyncope followed by a prolonged period of chest heaviness.  Patient's chest pain has some typical and atypical features (occurring at rest). Her workup so far include BNP, troponin, d-dimer, cxray and EKG are all stone-cold normal. Symptoms sound vagal in nature. Plan is for serial enzymes, telemetry, continue current medications. Could consider non-invasive evaluation with exercise echo which would also provide structural heart information as well as ischemic evaluation. Will defer to Dr. Myrtis Ser  who knows her well.   Will continue her current regimen of BB, CCB, statin and aspirin.  Prophylaxis: PPI subq lovenox for dvt Full code  Signed, Torrie Lafavor C. MD 05/12/2013, 3:34 AM

## 2013-05-14 ENCOUNTER — Encounter: Payer: Self-pay | Admitting: Genetic Counselor

## 2013-05-21 ENCOUNTER — Other Ambulatory Visit: Payer: Self-pay | Admitting: Cardiology

## 2013-05-22 ENCOUNTER — Encounter: Payer: Self-pay | Admitting: Cardiology

## 2013-05-22 NOTE — Telephone Encounter (Signed)
carvedilol (COREG) 3.125 MG tablet  Take 1 tablet (3.125 mg total) by mouth 2 (two) times daily with a meal.  60 tablet  4  Patient Instructions    Continue all current medications.  Follow up in 3 months                        Previous Visit      Provider Department Encounter #   02/12/2013 5:00 PM Willa Rough, MD [facility] [id number]

## 2013-05-25 ENCOUNTER — Ambulatory Visit (INDEPENDENT_AMBULATORY_CARE_PROVIDER_SITE_OTHER): Payer: Medicare Other | Admitting: Cardiology

## 2013-05-25 ENCOUNTER — Encounter: Payer: Self-pay | Admitting: Cardiology

## 2013-05-25 VITALS — BP 117/77 | HR 59 | Ht 65.5 in | Wt 180.0 lb

## 2013-05-25 DIAGNOSIS — R55 Syncope and collapse: Secondary | ICD-10-CM

## 2013-05-25 DIAGNOSIS — R9430 Abnormal result of cardiovascular function study, unspecified: Secondary | ICD-10-CM

## 2013-05-25 DIAGNOSIS — I5181 Takotsubo syndrome: Secondary | ICD-10-CM

## 2013-05-25 DIAGNOSIS — R0989 Other specified symptoms and signs involving the circulatory and respiratory systems: Secondary | ICD-10-CM

## 2013-05-25 NOTE — Patient Instructions (Addendum)

## 2013-05-25 NOTE — Assessment & Plan Note (Signed)
She's being careful with her hydration. She's feeling well. No further workup is needed.

## 2013-05-25 NOTE — Progress Notes (Signed)
HPI   Patient is seen post hospitalization , June, 2014.Marland Kitchen She been working in her yard and then went out to dinner. She felt poorly with presyncope. She came to the hospital and stabilize. Her rhythm was stable. Two-dimensional echo revealed continued normal LV function. Enzymes were negative. I discussed with her carefully using fluids to include electrolytes. She is to stay well hydrated but she is allowed to go about her full activities. She has been doing this and she is feeling fine.  In 2008 she had an episode of Takotsubo syndrome.. her ejection fraction normalized. Her EF was checked again in the hospital and it was normal.  No Known Allergies  Current Outpatient Prescriptions  Medication Sig Dispense Refill  . acetaminophen (TYLENOL) 650 MG CR tablet Take 650 mg by mouth every 8 (eight) hours as needed for pain.      Marland Kitchen amLODipine (NORVASC) 5 MG tablet Take 1 tablet by mouth Daily.      Marland Kitchen aspirin 81 MG EC tablet Take 81 mg by mouth daily.        . bimatoprost (LUMIGAN) 0.03 % ophthalmic solution Place 1 drop into both eyes at bedtime.       . brimonidine-timolol (COMBIGAN) 0.2-0.5 % ophthalmic solution Place 1 drop into both eyes 2 (two) times daily.      . carvedilol (COREG) 3.125 MG tablet TAKE 1 TABLET (3.125 MG TOTAL) BY MOUTH 2 (TWO) TIMES DAILY WITH A MEAL.  60 tablet  8  . levothyroxine (SYNTHROID, LEVOTHROID) 100 MCG tablet Take 100 mcg by mouth daily.        . meclizine (ANTIVERT) 25 MG tablet Take 25 mg by mouth 3 (three) times daily as needed.        Marland Kitchen omeprazole (PRILOSEC) 40 MG capsule Take 40 mg by mouth daily.        . polyethylene glycol powder (GLYCOLAX/MIRALAX) powder Take 17 g by mouth daily.      . simvastatin (ZOCOR) 10 MG tablet Take 1 tablet (10 mg total) by mouth at bedtime.  30 tablet  11   No current facility-administered medications for this visit.    History   Social History  . Marital Status: Widowed    Spouse Name: N/A    Number of Children: N/A    . Years of Education: N/A   Occupational History  . Not on file.   Social History Main Topics  . Smoking status: Never Smoker   . Smokeless tobacco: Never Used     Comment: tobacco use - no   . Alcohol Use: No  . Drug Use: No  . Sexually Active: Not on file   Other Topics Concern  . Not on file   Social History Narrative   Widowed, works full time, gets regular exercise.     Family History  Problem Relation Age of Onset  . Coronary artery disease      family hx  . Cancer Mother 83    breast  . Heart disease Father   . Cancer Sister     LCIS 77  . Cancer Maternal Aunt 22    breast    Past Medical History  Diagnosis Date  . Takotsubo syndrome     cardiac cath 11/08. minimal coronary disease, anteroseptal akinesis with an EF 55%.  echo 2/09 EF 60-65% (LV function normalized)  . HTN (hypertension)   . HLD (hyperlipidemia)     mixed  . Hypothyroidism   . GERD (gastroesophageal reflux disease)   .  Ejection fraction     EF 60-65%, echo, February, 2009, (LV function normalized).  . Cough     ACE cough 2012  . History of breast cancer     left  . Arthritis   . Takotsubo cardiomyopathy   . Chest pain     February, 2014  . Pre-syncope     Hospital, June, 2014, probably dehydration    Past Surgical History  Procedure Laterality Date  . Cholecystectomy    . Breast lumpectomy  2005    left breast  . Ganglion cyst excision      left  . Bunionectomy      left  . Breast biopsy  01/03/2012  . Total abdominal hysterectomy  1997    Patient Active Problem List   Diagnosis Date Noted  . Pre-syncope 05/12/2013  . Precordial pain 05/12/2013  . Near syncope - possible dehydration 05/12/2013  . Breast cancer 04/20/2013  . Family history of malignant neoplasm of breast 04/20/2013  . Left breast lump 01/02/2012  . Cough   . Takotsubo syndrome   . HTN (hypertension)   . HLD (hyperlipidemia)   . Hypothyroidism   . GERD (gastroesophageal reflux disease)   .  Ejection fraction   . DIAPHRAGMAT HERN W/O MENTION OBSTRUCTION/GANGREN 10/23/2010    ROS   Patient denies fever, chills, headache, sweats, rash, change in vision, change in hearing, chest pain, cough, nausea vomiting, urinary symptoms. All other systems are reviewed and are negative.  PHYSICAL EXAM  Patient is oriented to person time and place. Affect is normal. There is no jugulovenous distention. Lungs are clear. Respiratory effort is nonlabored. Cardiac exam reveals S1 and S2. There no clicks or significant murmurs. The abdomen is soft. There is no peripheral edema.  Filed Vitals:   05/25/13 1255  BP: 117/77  Pulse: 59  Height: 5' 5.5" (1.664 m)  Weight: 180 lb (81.647 kg)     ASSESSMENT & PLAN

## 2013-05-25 NOTE — Assessment & Plan Note (Signed)
She is maintaining normal ejection fraction 60-65%.

## 2013-05-26 ENCOUNTER — Other Ambulatory Visit: Payer: Self-pay | Admitting: Cardiology

## 2013-09-07 ENCOUNTER — Telehealth: Payer: Self-pay | Admitting: Genetic Counselor

## 2013-09-07 NOTE — Telephone Encounter (Signed)
Patient had called b/c she had received an EOB indicating that her insurance was not paying for her testing back in May.  I called [facility], and they do not have a signed ABN for her, so they cannot balance bill her.  Therefore she will not receive a bill greater than $100 from them.  Reported this to patient.

## 2013-11-17 ENCOUNTER — Ambulatory Visit (INDEPENDENT_AMBULATORY_CARE_PROVIDER_SITE_OTHER): Payer: Medicare Other | Admitting: Cardiology

## 2013-11-17 ENCOUNTER — Encounter: Payer: Self-pay | Admitting: Cardiology

## 2013-11-17 VITALS — BP 126/85 | HR 58 | Ht 66.0 in | Wt 191.8 lb

## 2013-11-17 DIAGNOSIS — R55 Syncope and collapse: Secondary | ICD-10-CM

## 2013-11-17 DIAGNOSIS — I5181 Takotsubo syndrome: Secondary | ICD-10-CM

## 2013-11-17 DIAGNOSIS — H409 Unspecified glaucoma: Secondary | ICD-10-CM

## 2013-11-17 NOTE — Assessment & Plan Note (Signed)
She had normalization of LV function. Her LV function continues normal in June, 2014. No further workup.

## 2013-11-17 NOTE — Progress Notes (Signed)
HPI  Patient is seen to followup an episode of presyncope. In the past she had a Takotsubo episode. LV function normalized. Then in June, 2014 she had an episode of presyncope. We felt it was related to volume. She has been very careful about her volume status since then she's done fine. 2-D echo at that time showed her LV function was normal.  She tells me that her eye doctor was questioning whether I might adjust her medicines. Unfortunately I do not know specifically what he has in mind. She will research this further and be in touch with [place].  No Known Allergies  Current Outpatient Prescriptions  Medication Sig Dispense Refill  . acetaminophen (TYLENOL) 650 MG CR tablet Take 650 mg by mouth every 8 (eight) hours as needed for pain.      Marland Kitchen amLODipine (NORVASC) 5 MG tablet Take 1 tablet by mouth Daily.      Marland Kitchen aspirin 81 MG EC tablet Take 81 mg by mouth daily.        . bimatoprost (LUMIGAN) 0.03 % ophthalmic solution Place 1 drop into both eyes at bedtime.       . carvedilol (COREG) 3.125 MG tablet TAKE 1 TABLET (3.125 MG TOTAL) BY MOUTH 2 (TWO) TIMES DAILY WITH A MEAL.  60 tablet  8  . dorzolamide-timolol (COSOPT) 22.3-6.8 MG/ML ophthalmic solution Place 1 drop into both eyes daily.      Marland Kitchen levothyroxine (SYNTHROID, LEVOTHROID) 100 MCG tablet Take 100 mcg by mouth daily.        . meclizine (ANTIVERT) 25 MG tablet Take 25 mg by mouth 3 (three) times daily as needed.        Marland Kitchen omeprazole (PRILOSEC) 40 MG capsule Take 40 mg by mouth daily.        . polyethylene glycol powder (GLYCOLAX/MIRALAX) powder Take 17 g by mouth as needed.       . simvastatin (ZOCOR) 10 MG tablet Take 1 tablet (10 mg total) by mouth at bedtime.  30 tablet  11   No current facility-administered medications for this visit.    History   Social History  . Marital Status: Widowed    Spouse Name: N/A    Number of Children: N/A  . Years of Education: N/A   Occupational History  . Not on file.   Social History  Main Topics  . Smoking status: Never Smoker   . Smokeless tobacco: Never Used     Comment: tobacco use - no   . Alcohol Use: No  . Drug Use: No  . Sexual Activity: Not on file   Other Topics Concern  . Not on file   Social History Narrative   Widowed, works full time, gets regular exercise.     Family History  Problem Relation Age of Onset  . Coronary artery disease      family hx  . Cancer Mother 10    breast  . Heart disease Father   . Cancer Sister     LCIS 66  . Cancer Maternal Aunt 65    breast    Past Medical History  Diagnosis Date  . Takotsubo syndrome     cardiac cath 11/08. minimal coronary disease, anteroseptal akinesis with an EF 55%.  echo 2/09 EF 60-65% (LV function normalized)  . HTN (hypertension)   . HLD (hyperlipidemia)     mixed  . Hypothyroidism   . GERD (gastroesophageal reflux disease)   . Ejection fraction     EF  60-65%, echo, February, 2009, (LV function normalized).  . Cough     ACE cough 2012  . History of breast cancer     left  . Arthritis   . Takotsubo cardiomyopathy   . Chest pain     February, 2014  . Pre-syncope     Hospital, June, 2014, probably dehydration    Past Surgical History  Procedure Laterality Date  . Cholecystectomy    . Breast lumpectomy  2005    left breast  . Ganglion cyst excision      left  . Bunionectomy      left  . Breast biopsy  01/03/2012  . Total abdominal hysterectomy  1997    Patient Active Problem List   Diagnosis Date Noted  . Glaucoma 11/17/2013  . Pre-syncope 05/12/2013  . Precordial pain 05/12/2013  . Near syncope - possible dehydration 05/12/2013  . Breast cancer 04/20/2013  . Family history of malignant neoplasm of breast 04/20/2013  . Left breast lump 01/02/2012  . Cough   . Takotsubo syndrome   . HTN (hypertension)   . HLD (hyperlipidemia)   . Hypothyroidism   . GERD (gastroesophageal reflux disease)   . Ejection fraction   . DIAPHRAGMAT HERN W/O MENTION  OBSTRUCTION/GANGREN 10/23/2010    ROS   Patient denies fever, chills, headache, sweats, rash, change in vision, change in hearing, chest pain, cough, nausea vomiting, urinary symptoms. All other systems are reviewed and are negative.  PHYSICAL EXAM  Patient is oriented to person time and place. Affect is normal. There is no jugulovenous distention. Lungs are clear. Respiratory effort is nonlabored. Cardiac exam reveals S1 and S2. There no clicks or significant murmurs. The abdomen is soft. There is no peripheral edema.  Filed Vitals:   11/17/13 1051  BP: 126/85  Pulse: 58  Height: 5\' 6"  (1.676 m)  Weight: 191 lb 12.8 oz (87 kg)  SpO2: 100%     ASSESSMENT & PLAN

## 2013-11-17 NOTE — Assessment & Plan Note (Signed)
Patient tells me that her eye doctor questions whether we can adjust her medications. I've asked her to check with him to see which medicines he is considering.

## 2013-11-17 NOTE — Assessment & Plan Note (Signed)
She's had no recurring symptoms. She is careful to keep herself hydrated when she works. She does sweat a lot.

## 2013-11-17 NOTE — Patient Instructions (Signed)

## 2014-02-15 ENCOUNTER — Other Ambulatory Visit: Payer: Self-pay | Admitting: Cardiology

## 2014-02-17 ENCOUNTER — Other Ambulatory Visit: Payer: Self-pay | Admitting: Cardiology

## 2014-06-22 ENCOUNTER — Other Ambulatory Visit: Payer: Self-pay | Admitting: Cardiology

## 2014-06-22 NOTE — Telephone Encounter (Signed)
carvedilol (COREG) 3.125 MG tablet  TAKE 1 TABLET (3.125 MG TOTAL) BY MOUTH 2 (TWO) TIMES DAILY WITH A MEAL.   60 tablet   8   Your physician recommends that you continue on your current medications as directed. Please refer to the Current Medication list given to you today. Carlena Bjornstad, MD at 11/17/2013 11:17 AM

## 2014-06-26 ENCOUNTER — Other Ambulatory Visit: Payer: Self-pay | Admitting: Cardiology

## 2014-07-14 ENCOUNTER — Encounter (INDEPENDENT_AMBULATORY_CARE_PROVIDER_SITE_OTHER): Payer: Medicare Other | Admitting: Ophthalmology

## 2014-07-14 DIAGNOSIS — H35039 Hypertensive retinopathy, unspecified eye: Secondary | ICD-10-CM

## 2014-07-14 DIAGNOSIS — H35359 Cystoid macular degeneration, unspecified eye: Secondary | ICD-10-CM

## 2014-07-14 DIAGNOSIS — H43819 Vitreous degeneration, unspecified eye: Secondary | ICD-10-CM

## 2014-07-14 DIAGNOSIS — I10 Essential (primary) hypertension: Secondary | ICD-10-CM

## 2014-08-25 ENCOUNTER — Encounter (INDEPENDENT_AMBULATORY_CARE_PROVIDER_SITE_OTHER): Payer: Medicare Other | Admitting: Ophthalmology

## 2014-08-25 DIAGNOSIS — H4420 Degenerative myopia, unspecified eye: Secondary | ICD-10-CM

## 2014-08-25 DIAGNOSIS — I10 Essential (primary) hypertension: Secondary | ICD-10-CM

## 2014-08-25 DIAGNOSIS — H43819 Vitreous degeneration, unspecified eye: Secondary | ICD-10-CM

## 2014-08-25 DIAGNOSIS — H35359 Cystoid macular degeneration, unspecified eye: Secondary | ICD-10-CM

## 2014-08-25 DIAGNOSIS — H35039 Hypertensive retinopathy, unspecified eye: Secondary | ICD-10-CM

## 2014-10-23 ENCOUNTER — Other Ambulatory Visit: Payer: Self-pay | Admitting: Cardiology

## 2014-10-26 ENCOUNTER — Other Ambulatory Visit: Payer: Self-pay | Admitting: Cardiology

## 2014-11-09 ENCOUNTER — Ambulatory Visit (INDEPENDENT_AMBULATORY_CARE_PROVIDER_SITE_OTHER): Payer: Medicare Other | Admitting: Cardiology

## 2014-11-09 ENCOUNTER — Encounter: Payer: Self-pay | Admitting: Cardiology

## 2014-11-09 VITALS — BP 131/82 | HR 72 | Ht 66.0 in | Wt 198.0 lb

## 2014-11-09 DIAGNOSIS — R55 Syncope and collapse: Secondary | ICD-10-CM

## 2014-11-09 DIAGNOSIS — E785 Hyperlipidemia, unspecified: Secondary | ICD-10-CM

## 2014-11-09 DIAGNOSIS — I10 Essential (primary) hypertension: Secondary | ICD-10-CM

## 2014-11-09 DIAGNOSIS — I5181 Takotsubo syndrome: Secondary | ICD-10-CM

## 2014-11-09 NOTE — Assessment & Plan Note (Signed)
Blood pressures controlled. No change in therapy. 

## 2014-11-09 NOTE — Assessment & Plan Note (Signed)
The event occurred in 2008. She had minimal coronary disease by catheterization. She had anteroseptal akinesis. Follow-up echo in 2009 and 2014 showed normal LV function. No further workup at this time.

## 2014-11-09 NOTE — Assessment & Plan Note (Signed)
Patient is on a small dose of a statin.

## 2014-11-09 NOTE — Assessment & Plan Note (Signed)
She has not had any recurring episodes. She is very careful with her salt and fluid intake when she's working outside in the summertime.

## 2014-11-09 NOTE — Patient Instructions (Signed)
Continue all current medications. Your physician wants you to follow up in:  1 year.  You will receive a reminder letter in the mail one-two months in advance.  If you don't receive a letter, please call our office to schedule the follow up appointment   

## 2014-11-09 NOTE — Progress Notes (Signed)
Patient ID: Julie Jefferson, female   DOB: 12-15-1945, 68 y.o.   MRN: [id number]    HPI Patient is seen today to follow-up history of syncope and a separate episode of tach at Takotsubo syndrome. After her episode her LV recovered normally. Several years after that she had a presyncopal episode in 2014. We thought was related to volume depletion. Echo again at that time showed excellent LV function. She is careful to watch her volume status particularly when she works in her yard in the summertime. She goes about full activities and not having any significant problems. Her daughter is one of the very experienced nurses who works in the [facility] [employer] system.  Allergies  Allergen Reactions  . Alphagan [Brimonidine] Other (See Comments)    Dries eyes out.   . Dorzolamide Hcl Other (See Comments)    Dried eyes out.    Current Outpatient Prescriptions  Medication Sig Dispense Refill  . acetaminophen (TYLENOL) 650 MG CR tablet Take 650 mg by mouth every 8 (eight) hours as needed for pain.    Marland Kitchen amLODipine (NORVASC) 5 MG tablet Take 1 tablet by mouth Daily.    Marland Kitchen aspirin 81 MG EC tablet Take 81 mg by mouth daily.      . bimatoprost (LUMIGAN) 0.03 % ophthalmic solution Place 1 drop into both eyes at bedtime.     . carvedilol (COREG) 3.125 MG tablet TAKE 1 TABLET (3.125 MG TOTAL) BY MOUTH 2 (TWO) TIMES DAILY WITH A MEAL. 60 tablet 8  . levothyroxine (SYNTHROID, LEVOTHROID) 100 MCG tablet Take 100 mcg by mouth daily.      . meclizine (ANTIVERT) 25 MG tablet Take 25 mg by mouth 3 (three) times daily as needed.      Marland Kitchen omeprazole (PRILOSEC) 40 MG capsule Take 40 mg by mouth daily.      . polyethylene glycol powder (GLYCOLAX/MIRALAX) powder Take 17 g by mouth as needed.     . simvastatin (ZOCOR) 10 MG tablet TAKE 1 TABLET BY MOUTH AT BEDTIME 30 tablet 11   No current facility-administered medications for this visit.    History   Social History  . Marital Status: Widowed    Spouse Name: N/A    Number of  Children: N/A  . Years of Education: N/A   Occupational History  . Not on file.   Social History Main Topics  . Smoking status: Never Smoker   . Smokeless tobacco: Never Used     Comment: tobacco use - no   . Alcohol Use: No  . Drug Use: No  . Sexual Activity: Not on file   Other Topics Concern  . Not on file   Social History Narrative   Widowed, works full time, gets regular exercise.     Family History  Problem Relation Age of Onset  . Coronary artery disease      family hx  . Cancer Mother 95    breast  . Heart disease Father   . Cancer Sister     LCIS 62  . Cancer Maternal Aunt 75    breast    Past Medical History  Diagnosis Date  . Takotsubo syndrome     cardiac cath 11/08. minimal coronary disease, anteroseptal akinesis with an EF 55%.  echo 2/09 EF 60-65% (LV function normalized)  . HTN (hypertension)   . HLD (hyperlipidemia)     mixed  . Hypothyroidism   . GERD (gastroesophageal reflux disease)   . Ejection fraction     EF  60-65%, echo, February, 2009, (LV function normalized).  . Cough     ACE cough 2012  . History of breast cancer     left  . Arthritis   . Takotsubo cardiomyopathy   . Chest pain     February, 2014  . Pre-syncope     Hospital, June, 2014, probably dehydration    Past Surgical History  Procedure Laterality Date  . Cholecystectomy    . Breast lumpectomy  2005    left breast  . Ganglion cyst excision      left  . Bunionectomy      left  . Breast biopsy  01/03/2012  . Total abdominal hysterectomy  1997    Patient Active Problem List   Diagnosis Date Noted  . Glaucoma 11/17/2013  . Pre-syncope 05/12/2013  . Precordial pain 05/12/2013  . Near syncope - possible dehydration 05/12/2013  . Breast cancer 04/20/2013  . Family history of malignant neoplasm of breast 04/20/2013  . Left breast lump 01/02/2012  . Cough   . Takotsubo syndrome   . HTN (hypertension)   . HLD (hyperlipidemia)   . Hypothyroidism   . GERD  (gastroesophageal reflux disease)   . Ejection fraction   . DIAPHRAGMAT HERN W/O MENTION OBSTRUCTION/GANGREN 10/23/2010    ROS  Patient denies fever, chills, headache, sweats, rash, change in vision, change in hearing, chest pain, cough, nausea or vomiting, urinary symptoms. All other systems are reviewed and are negative.  PHYSICAL EXAM Patient looks great. She is oriented to person time and place. Affect is normal. Head is atraumatic. Sclera and conjunctiva are normal. There is no jugular venous distention. Lungs are clear. Respiratory effort is nonlabored. Cardiac exam feels S1 and S2. Abdomen is soft and there is no peripheral edema. There are no musculoskeletal deformities. There are no skin rashes. Filed Vitals:   11/09/14 1316  BP: 131/82  Pulse: 72  Height: 5\' 6"  (1.676 m)  Weight: 198 lb (89.812 kg)  SpO2: 98%   EKG is done today and reviewed by me. The EKG is normal. There is no change from the past.  ASSESSMENT & PLAN

## 2014-12-03 DIAGNOSIS — S82891A Other fracture of right lower leg, initial encounter for closed fracture: Secondary | ICD-10-CM

## 2014-12-03 HISTORY — DX: Other fracture of right lower leg, initial encounter for closed fracture: S82.891A

## 2015-03-05 ENCOUNTER — Other Ambulatory Visit: Payer: Self-pay | Admitting: Cardiology

## 2015-05-01 ENCOUNTER — Emergency Department (HOSPITAL_COMMUNITY)
Admission: EM | Admit: 2015-05-01 | Discharge: 2015-05-01 | Disposition: A | Discharge status: Home / Self Care | Payer: Medicare Other | Attending: Emergency Medicine | Admitting: Emergency Medicine

## 2015-05-01 ENCOUNTER — Encounter (HOSPITAL_COMMUNITY): Payer: Self-pay | Admitting: Emergency Medicine

## 2015-05-01 ENCOUNTER — Emergency Department (HOSPITAL_COMMUNITY): Payer: Medicare Other

## 2015-05-01 DIAGNOSIS — Z9889 Other specified postprocedural states: Secondary | ICD-10-CM | POA: Insufficient documentation

## 2015-05-01 DIAGNOSIS — Z79899 Other long term (current) drug therapy: Secondary | ICD-10-CM | POA: Insufficient documentation

## 2015-05-01 DIAGNOSIS — E785 Hyperlipidemia, unspecified: Secondary | ICD-10-CM | POA: Insufficient documentation

## 2015-05-01 DIAGNOSIS — M1990 Unspecified osteoarthritis, unspecified site: Secondary | ICD-10-CM | POA: Diagnosis not present

## 2015-05-01 DIAGNOSIS — Z880 Allergy status to penicillin: Secondary | ICD-10-CM | POA: Diagnosis not present

## 2015-05-01 DIAGNOSIS — K219 Gastro-esophageal reflux disease without esophagitis: Secondary | ICD-10-CM | POA: Insufficient documentation

## 2015-05-01 DIAGNOSIS — E039 Hypothyroidism, unspecified: Secondary | ICD-10-CM | POA: Diagnosis not present

## 2015-05-01 DIAGNOSIS — W108XXA Fall (on) (from) other stairs and steps, initial encounter: Secondary | ICD-10-CM | POA: Insufficient documentation

## 2015-05-01 DIAGNOSIS — Y9289 Other specified places as the place of occurrence of the external cause: Secondary | ICD-10-CM | POA: Diagnosis not present

## 2015-05-01 DIAGNOSIS — S82899A Other fracture of unspecified lower leg, initial encounter for closed fracture: Secondary | ICD-10-CM

## 2015-05-01 DIAGNOSIS — Y998 Other external cause status: Secondary | ICD-10-CM | POA: Insufficient documentation

## 2015-05-01 DIAGNOSIS — Y9389 Activity, other specified: Secondary | ICD-10-CM | POA: Diagnosis not present

## 2015-05-01 DIAGNOSIS — Z7982 Long term (current) use of aspirin: Secondary | ICD-10-CM | POA: Insufficient documentation

## 2015-05-01 DIAGNOSIS — Z853 Personal history of malignant neoplasm of breast: Secondary | ICD-10-CM | POA: Insufficient documentation

## 2015-05-01 DIAGNOSIS — M25579 Pain in unspecified ankle and joints of unspecified foot: Secondary | ICD-10-CM

## 2015-05-01 DIAGNOSIS — I2510 Atherosclerotic heart disease of native coronary artery without angina pectoris: Secondary | ICD-10-CM | POA: Diagnosis not present

## 2015-05-01 DIAGNOSIS — M25571 Pain in right ankle and joints of right foot: Secondary | ICD-10-CM

## 2015-05-01 DIAGNOSIS — I10 Essential (primary) hypertension: Secondary | ICD-10-CM | POA: Diagnosis not present

## 2015-05-01 DIAGNOSIS — S8251XA Displaced fracture of medial malleolus of right tibia, initial encounter for closed fracture: Secondary | ICD-10-CM | POA: Insufficient documentation

## 2015-05-01 DIAGNOSIS — S99911A Unspecified injury of right ankle, initial encounter: Secondary | ICD-10-CM | POA: Diagnosis present

## 2015-05-01 MED ORDER — PROPOFOL 10 MG/ML IV BOLUS
INTRAVENOUS | Status: AC | PRN
  Administered 2015-05-01: 50 ug via INTRAVENOUS

## 2015-05-01 MED ORDER — PROPOFOL 10 MG/ML IV BOLUS
100.0000 mg | Freq: Once | INTRAVENOUS | Status: AC
  Administered 2015-05-01: 70 mg via INTRAVENOUS
  Filled 2015-05-01: qty 20

## 2015-05-01 MED ORDER — OXYCODONE-ACETAMINOPHEN 5-325 MG PO TABS: 2.0000 | ORAL_TABLET | ORAL | Status: DC | PRN

## 2015-05-01 MED ORDER — ONDANSETRON HCL 4 MG/2ML IJ SOLN: 4.0000 mg | Freq: Once | INTRAMUSCULAR | Status: DC

## 2015-05-01 MED ORDER — FENTANYL CITRATE (PF) 100 MCG/2ML IJ SOLN
50.0000 ug | INTRAMUSCULAR | Status: DC | PRN
  Administered 2015-05-01 (×2): 50 ug via INTRAVENOUS
  Administered 2015-05-01: 100 ug via INTRAVENOUS
  Administered 2015-05-01: 50 ug via INTRAVENOUS
  Filled 2015-05-01 (×6): qty 2

## 2015-05-01 MED ORDER — OXYCODONE-ACETAMINOPHEN 5-325 MG PO TABS
1.0000 | ORAL_TABLET | Freq: Once | ORAL | Status: AC
  Administered 2015-05-01: 1 via ORAL
  Filled 2015-05-01: qty 1

## 2015-05-01 MED ORDER — ONDANSETRON HCL 4 MG/2ML IJ SOLN
4.0000 mg | Freq: Once | INTRAMUSCULAR | Status: AC
  Administered 2015-05-01: 4 mg via INTRAVENOUS
  Filled 2015-05-01: qty 2

## 2015-05-01 NOTE — Consult Note (Signed)
ORTHOPAEDIC CONSULTATION  REQUESTING PHYSICIAN: Tanna Furry, MD  Chief Complaint: R ankle injury  HPI: Julie Jefferson is a 69 y.o. female who complains of a mechanical fall this morning. She has a history of bunion surgeries to this foot. She is walking down her steps when she tripped over the locking bar. She complains of right ankle pain. She was splinted but ankle is still subluxed  Past Medical History  Diagnosis Date  . Takotsubo syndrome     cardiac cath 11/08. minimal coronary disease, anteroseptal akinesis with an EF 55%.  echo 2/09 EF 60-65% (LV function normalized)  . HTN (hypertension)   . HLD (hyperlipidemia)     mixed  . Hypothyroidism   . GERD (gastroesophageal reflux disease)   . Ejection fraction     EF 60-65%, echo, February, 2009, (LV function normalized).  . Cough     ACE cough 2012  . History of breast cancer     left  . Arthritis   . Takotsubo cardiomyopathy   . Chest pain     February, 2014  . Pre-syncope     Hospital, June, 2014, probably dehydration   Past Surgical History  Procedure Laterality Date  . Cholecystectomy    . Breast lumpectomy  2005    left breast  . Ganglion cyst excision      left  . Bunionectomy      left  . Breast biopsy  01/03/2012  . Total abdominal hysterectomy  1997   History   Social History  . Marital Status: Widowed    Spouse Name: N/A  . Number of Children: N/A  . Years of Education: N/A   Social History Main Topics  . Smoking status: Never Smoker   . Smokeless tobacco: Never Used     Comment: tobacco use - no   . Alcohol Use: No  . Drug Use: No  . Sexual Activity: Not on file   Other Topics Concern  . None   Social History Narrative   Widowed, works full time, gets regular exercise.    Family History  Problem Relation Age of Onset  . Coronary artery disease      family hx  . Cancer Mother 19    breast  . Heart disease Father   . Cancer Sister     LCIS 32  . Cancer Maternal Aunt 75   breast   Allergies  Allergen Reactions  . Alphagan [Brimonidine] Other (See Comments)    Dries eyes out.   . Dorzolamide Hcl Other (See Comments)    Dried eyes out.  Marland Kitchen Penicillins    Prior to Admission medications   Medication Sig Start Date End Date Taking? Authorizing Provider  acetaminophen (TYLENOL) 650 MG CR tablet Take 650 mg by mouth every 8 (eight) hours as needed for pain.   Yes Historical Provider, MD  amLODipine (NORVASC) 5 MG tablet Take 1 tablet by mouth Daily. 10/03/11  Yes Historical Provider, MD  aspirin 81 MG EC tablet Take 81 mg by mouth daily.     Yes Historical Provider, MD  bimatoprost (LUMIGAN) 0.03 % ophthalmic solution Place 1 drop into both eyes at bedtime.    Yes Historical Provider, MD  carvedilol (COREG) 3.125 MG tablet TAKE 1 TABLET (3.125 MG TOTAL) BY MOUTH 2 (TWO) TIMES DAILY WITH A MEAL. 05/21/13  Yes Carlena Bjornstad, MD  levothyroxine (SYNTHROID, LEVOTHROID) 100 MCG tablet Take 100 mcg by mouth daily.     Yes Historical  Provider, MD  meclizine (ANTIVERT) 25 MG tablet Take 25 mg by mouth 3 (three) times daily as needed for dizziness.    Yes Historical Provider, MD  omeprazole (PRILOSEC) 40 MG capsule Take 40 mg by mouth daily.     Yes Historical Provider, MD  simvastatin (ZOCOR) 10 MG tablet TAKE 1 TABLET BY MOUTH AT BEDTIME 03/07/15  Yes Carlena Bjornstad, MD  polyethylene glycol powder (GLYCOLAX/MIRALAX) powder Take 17 g by mouth as needed for mild constipation.     Historical Provider, MD   Dg Ankle 2 Views Right  05/01/2015   CLINICAL DATA:  Post reduction.  EXAM: RIGHT ANKLE - 2 VIEW  COMPARISON:  05/01/2015  FINDINGS: Overlying cast obscures evaluation of bony detail. Exam demonstrates evidence of patient's trimalleolar fracture post reduction. There is near anatomic alignment about the distal fibular fracture and medial malleolar fracture with mild residual posterior displacement of the posterior malleolar fracture. There is moderate residual anterior  subluxation of the tibia on the talus.  IMPRESSION: Moderate residual anterior subluxation of the tibia on the talus. Mild residual displacement of the posterior malleolar fracture with near anatomic alignment of the medial malleolar fracture and distal fibular fracture.   Electronically Signed   By: Marin Olp M.D.   On: 05/01/2015 09:28   Dg Ankle Complete Right  05/01/2015   CLINICAL DATA:  Trip over object today with significant right ankle injury, pain and swelling. Initial encounter.  EXAM: RIGHT ANKLE - COMPLETE 3+ VIEW  COMPARISON:  None.  FINDINGS: Bimalleolar fracture dislocation of the right ankle present with significant displacement. A definite posterior malleolar fracture is not definitely identified, but suspected. The talus is dislocated relative to the ankle mortise in a lateral and posterior direction. Significant displacement of lateral malleolus fragment posteriorly.  IMPRESSION: Fracture dislocation of the right ankle with significantly displaced lateral and medial malleolar fractures. It is difficult to visualize a posterior malleolar component, but fracture may be present due to the direction of talar dislocation.   Electronically Signed   By: Aletta Edouard M.D.   On: 05/01/2015 08:32    Positive ROS: All other systems have been reviewed and were otherwise negative with the exception of those mentioned in the HPI and as above.  Labs cbc No results for input(s): WBC, HGB, HCT, PLT in the last 72 hours.  Labs inflam No results for input(s): CRP in the last 72 hours.  Invalid input(s): ESR  Labs coag No results for input(s): INR, PTT in the last 72 hours.  Invalid input(s): PT  No results for input(s): NA, K, CL, CO2, GLUCOSE, BUN, CREATININE, CALCIUM in the last 72 hours.  Physical Exam: Filed Vitals:   05/01/15 1045  BP: 127/69  Pulse: 57  Temp:   Resp: 17   General: Alert, no acute distress Cardiovascular: No pedal edema Respiratory: No cyanosis, no use of  accessory musculature GI: No organomegaly, abdomen is soft and non-tender Skin: No lesions in the area of chief complaint other than those listed below in MSK exam.  Neurologic: Sensation intact distally Psychiatric: Patient is competent for consent with normal mood and affect Lymphatic: No axillary or cervical lymphadenopathy  MUSCULOSKELETAL:  RLE: Skin is intact compartments are soft she is neurovascularly intact. Skin is benign. She has pain with any ankle motion. No other TTP. Other extremities are atraumatic with painless ROM and NVI.  Assessment: R Trimal fracture  Plan: I performed a closed reduction and splinting in the emergency room today she  tolerated this well postreduction film showed appropriate alignment.  She will be discharged home she'll elevate as much as possible and be set up for subacute ORIF as swelling improves.    Procedure: After appropriate timeout sedation was provided by the ER staff I performed a closed reduction of her ankle followed by splinting with a short leg splint. Postreduction films were appropriate she tolerated this procedure well.   Renette Butters, MD Cell [phone]   05/01/2015 10:49 AM

## 2015-05-01 NOTE — ED Notes (Signed)
Pt brought back to room.  Right ankle swollen, skin appears taught medial malleolus.  + pedal pulse. Pt alert, denies any other injuries.

## 2015-05-01 NOTE — ED Notes (Signed)
Ortho to come down to attempt further reduction.  Prepared for procedural sedation again.

## 2015-05-01 NOTE — Discharge Instructions (Signed)
Nonweightbearing Follow-up Dr. Fredonia Highland on Wednesday as scheduled appointment  Ankle Fracture A fracture is a break in a bone. The ankle joint is made up of three bones. These include the lower (distal)sections of your lower leg bones, called the tibia and fibula, along with a bone in your foot, called the talus. Depending on how bad the break is and if more than one ankle joint bone is broken, a cast or splint is used to protect and keep your injured bone from moving while it heals. Sometimes, surgery is required to help the fracture heal properly.  There are two general types of fractures:  Stable fracture. This includes a single fracture line through one bone, with no injury to ankle ligaments. A fracture of the talus that does not have any displacement (movement of the bone on either side of the fracture line) is also stable.  Unstable fracture. This includes more than one fracture line through one or more bones in the ankle joint. It also includes fractures that have displacement of the bone on either side of the fracture line. CAUSES  A direct blow to the ankle.   Quickly and severely twisting your ankle.  Trauma, such as a car accident or falling from a significant height. RISK FACTORS You may be at a higher risk of ankle fracture if:  You have certain medical conditions.  You are involved in high-impact sports.  You are involved in a high-impact car accident. SIGNS AND SYMPTOMS   Tender and swollen ankle.  Bruising around the injured ankle.  Pain on movement of the ankle.  Difficulty walking or putting weight on the ankle.  A cold foot below the site of the ankle injury. This can occur if the blood vessels passing through your injured ankle were also damaged.  Numbness in the foot below the site of the ankle injury. DIAGNOSIS  An ankle fracture is usually diagnosed with a physical exam and X-rays. A CT scan may also be required for complex fractures. TREATMENT    Stable fractures are treated with a cast or splint and using crutches to avoid putting weight on your injured ankle. This is followed by an ankle strengthening program. Some patients require a special type of cast, depending on other medical problems they may have. Unstable fractures require surgery to ensure the bones heal properly. Your health care provider will tell you what type of fracture you have and the best treatment for your condition. HOME CARE INSTRUCTIONS   Review correct crutch use with your health care provider and use your crutches as directed. Safe use of crutches is extremely important. Misuse of crutches can cause you to fall or cause injury to nerves in your hands or armpits.  Do not put weight or pressure on the injured ankle until directed by your health care provider.  To lessen the swelling, keep the injured leg elevated while sitting or lying down.  Apply ice to the injured area:  Put ice in a plastic bag.  Place a towel between your cast and the bag.  Leave the ice on for 20 minutes, 2-3 times a day.  If you have a plaster or fiberglass cast:  Do not try to scratch the skin under the cast with any objects. This can increase your risk of skin infection.  Check the skin around the cast every day. You may put lotion on any red or sore areas.  Keep your cast dry and clean.  If you have a plaster splint:  Wear the splint as directed.  You may loosen the elastic around the splint if your toes become numb, tingle, or turn cold or blue.  Do not put pressure on any part of your cast or splint; it may break. Rest your cast only on a pillow the first 24 hours until it is fully hardened.  Your cast or splint can be protected during bathing with a plastic bag sealed to your skin with medical tape. Do not lower the cast or splint into water.  Take medicines as directed by your health care provider. Only take over-the-counter or prescription medicines for pain,  discomfort, or fever as directed by your health care provider.  Do not drive a vehicle until your health care provider specifically tells you it is safe to do so.  If your health care provider has given you a follow-up appointment, it is very important to keep that appointment. Not keeping the appointment could result in a chronic or permanent injury, pain, and disability. If you have any problem keeping the appointment, call the facility for assistance. SEEK MEDICAL CARE IF: You develop increased swelling or discomfort. SEEK IMMEDIATE MEDICAL CARE IF:   Your cast gets damaged or breaks.  You have continued severe pain.  You develop new pain or swelling after the cast was put on.  Your skin or toenails below the injury turn blue or gray.  Your skin or toenails below the injury feel cold, numb, or have loss of sensitivity to touch.  There is a bad smell or pus draining from under the cast. MAKE SURE YOU:   Understand these instructions.  Will watch your condition.  Will get help right away if you are not doing well or get worse. Document Released: 11/16/2000 Document Revised: 11/24/2013 Document Reviewed: 06/18/2013 [facility] Patient Information 2015 [place], [place]. This information is not intended to replace advice given to you by your health care provider. Make sure you discuss any questions you have with your health care provider.

## 2015-05-01 NOTE — ED Notes (Signed)
Slipped down two stairs onto landing, rolled ankle on security bar at door. Right ankle obvious deformity, + pedal pulse marked.  Ice applied.  MD to bedside

## 2015-05-01 NOTE — Progress Notes (Signed)
Orthopedic Tech Progress Note Patient Details:  Julie Jefferson Feb 28, 1946 [id number]  Ortho Devices Type of Ortho Device: Ace wrap, Post (short leg) splint, Stirrup splint Ortho Device/Splint Location: rle Ortho Device/Splint Interventions: Application As ordered by Dr.James Karyl Kinnier, Lorieann Argueta 05/01/2015, 9:02 AM

## 2015-05-01 NOTE — ED Provider Notes (Signed)
CSN: [id number]     Arrival date & time 05/01/15  0017 History   First MD Initiated Contact with Patient 05/01/15 [phone]     Chief Complaint  Patient presents with  . Ankle Pain      HPI  She presents for evaluation of an ankle injury. She lives in [place]. She is in town with her family. They're preparing to go to the beach for the holiday. She slipped off of a step at the house. Her ankle slid down 2 steps before inverting. She felt pain and swelling and deformity noted. Presents here. No other injuries.  Past Medical History  Diagnosis Date  . Takotsubo syndrome     cardiac cath 11/08. minimal coronary disease, anteroseptal akinesis with an EF 55%.  echo 2/09 EF 60-65% (LV function normalized)  . HTN (hypertension)   . HLD (hyperlipidemia)     mixed  . Hypothyroidism   . GERD (gastroesophageal reflux disease)   . Ejection fraction     EF 60-65%, echo, February, 2009, (LV function normalized).  . Cough     ACE cough 2012  . History of breast cancer     left  . Arthritis   . Takotsubo cardiomyopathy   . Chest pain     February, 2014  . Pre-syncope     Hospital, June, 2014, probably dehydration   Past Surgical History  Procedure Laterality Date  . Cholecystectomy    . Breast lumpectomy  2005    left breast  . Ganglion cyst excision      left  . Bunionectomy      left  . Breast biopsy  01/03/2012  . Total abdominal hysterectomy  1997   Family History  Problem Relation Age of Onset  . Coronary artery disease      family hx  . Cancer Mother 76    breast  . Heart disease Father   . Cancer Sister     LCIS 71  . Cancer Maternal Aunt 75    breast   History  Substance Use Topics  . Smoking status: Never Smoker   . Smokeless tobacco: Never Used     Comment: tobacco use - no   . Alcohol Use: No   OB History    No data available     Review of Systems  Constitutional: Negative for fever, chills, diaphoresis, appetite change and fatigue.  HENT: Negative for  mouth sores, sore throat and trouble swallowing.   Eyes: Negative for visual disturbance.  Respiratory: Negative for cough, chest tightness, shortness of breath and wheezing.   Cardiovascular: Negative for chest pain.  Gastrointestinal: Negative for nausea, vomiting, abdominal pain, diarrhea and abdominal distention.  Endocrine: Negative for polydipsia, polyphagia and polyuria.  Genitourinary: Negative for dysuria, frequency and hematuria.  Musculoskeletal: Negative for gait problem.       Ankle pain and deformity  Skin: Negative for color change, pallor and rash.  Neurological: Negative for dizziness, syncope, light-headedness and headaches.  Hematological: Does not bruise/bleed easily.  Psychiatric/Behavioral: Negative for behavioral problems and confusion.      Allergies  Alphagan; Dorzolamide hcl; and Penicillins  Home Medications   Prior to Admission medications   Medication Sig Start Date End Date Taking? Authorizing Provider  acetaminophen (TYLENOL) 650 MG CR tablet Take 650 mg by mouth every 8 (eight) hours as needed for pain.   Yes Historical Provider, MD  amLODipine (NORVASC) 5 MG tablet Take 1 tablet by mouth Daily. 10/03/11  Yes Historical Provider, MD  aspirin 81 MG EC tablet Take 81 mg by mouth daily.     Yes Historical Provider, MD  bimatoprost (LUMIGAN) 0.03 % ophthalmic solution Place 1 drop into both eyes at bedtime.    Yes Historical Provider, MD  carvedilol (COREG) 3.125 MG tablet TAKE 1 TABLET (3.125 MG TOTAL) BY MOUTH 2 (TWO) TIMES DAILY WITH A MEAL. 05/21/13  Yes Carlena Bjornstad, MD  levothyroxine (SYNTHROID, LEVOTHROID) 100 MCG tablet Take 100 mcg by mouth daily.     Yes Historical Provider, MD  meclizine (ANTIVERT) 25 MG tablet Take 25 mg by mouth 3 (three) times daily as needed for dizziness.    Yes Historical Provider, MD  omeprazole (PRILOSEC) 40 MG capsule Take 40 mg by mouth daily.     Yes Historical Provider, MD  simvastatin (ZOCOR) 10 MG tablet TAKE 1  TABLET BY MOUTH AT BEDTIME 03/07/15  Yes Carlena Bjornstad, MD  oxyCODONE-acetaminophen (PERCOCET/ROXICET) 5-325 MG per tablet Take 2 tablets by mouth every 4 (four) hours as needed. 05/01/15   Tanna Furry, MD  polyethylene glycol powder (GLYCOLAX/MIRALAX) powder Take 17 g by mouth as needed for mild constipation.     Historical Provider, MD   BP 118/65 mmHg  Pulse 55  Temp(Src) 98.2 F (36.8 C) (Oral)  Resp 12  SpO2 100% Physical Exam  Constitutional: She is oriented to person, place, and time. She appears well-developed and well-nourished. No distress.  HENT:  Head: Normocephalic.  Eyes: Conjunctivae are normal. Pupils are equal, round, and reactive to light. No scleral icterus.  Neck: Normal range of motion. Neck supple. No thyromegaly present.  Cardiovascular: Normal rate and regular rhythm.  Exam reveals no gallop and no friction rub.   No murmur heard. Pulmonary/Chest: Effort normal and breath sounds normal. No respiratory distress. She has no wheezes. She has no rales.  Abdominal: Soft. Bowel sounds are normal. She exhibits no distension. There is no tenderness. There is no rebound.  Musculoskeletal: Normal range of motion.       Feet:  Neurological: She is alert and oriented to person, place, and time.  Skin: Skin is warm and dry. No rash noted.  Psychiatric: She has a normal mood and affect. Her behavior is normal.    ED Course  Procedures (including critical care time) Labs Review Labs Reviewed - No data to display  Imaging Review Dg Ankle 2 Views Right  05/01/2015   CLINICAL DATA:  Post reduction.  EXAM: RIGHT ANKLE - 2 VIEW  COMPARISON:  05/01/2015  FINDINGS: Overlying cast obscures evaluation of bony detail. Exam demonstrates evidence of patient's trimalleolar fracture post reduction. There is near anatomic alignment about the distal fibular fracture and medial malleolar fracture with mild residual posterior displacement of the posterior malleolar fracture. There is moderate  residual anterior subluxation of the tibia on the talus.  IMPRESSION: Moderate residual anterior subluxation of the tibia on the talus. Mild residual displacement of the posterior malleolar fracture with near anatomic alignment of the medial malleolar fracture and distal fibular fracture.   Electronically Signed   By: Marin Olp M.D.   On: 05/01/2015 09:28   Dg Ankle Complete Right  05/01/2015   CLINICAL DATA:  Trip over object today with significant right ankle injury, pain and swelling. Initial encounter.  EXAM: RIGHT ANKLE - COMPLETE 3+ VIEW  COMPARISON:  None.  FINDINGS: Bimalleolar fracture dislocation of the right ankle present with significant displacement. A definite posterior malleolar fracture is not definitely identified, but suspected. The talus is  dislocated relative to the ankle mortise in a lateral and posterior direction. Significant displacement of lateral malleolus fragment posteriorly.  IMPRESSION: Fracture dislocation of the right ankle with significantly displaced lateral and medial malleolar fractures. It is difficult to visualize a posterior malleolar component, but fracture may be present due to the direction of talar dislocation.   Electronically Signed   By: Aletta Edouard M.D.   On: 05/01/2015 08:32   Dg Ankle Right Port  05/01/2015   CLINICAL DATA:  Postreduction radiographs.  EXAM: PORTABLE RIGHT ANKLE - 2 VIEW  COMPARISON:  Right ankle radiographs obtained earlier the same date.  FINDINGS: Anterior subluxation of the talus noted on the previous post reduction images has been reduced. Tibia is now normally aligned with the talus. Fracture fragments are only minimally displaced. No significant fracture angulation.  IMPRESSION: Ankle joint has been reduced into normal alignment. Fractured relatively well aligned following closed reduction.   Electronically Signed   By: Lajean Manes M.D.   On: 05/01/2015 11:41     EKG Interpretation None      MDM   Final diagnoses:    Ankle pain  Ankle fracture    Upon evaluating the patient she has some stretching of the skin. But no compromise of the skin breakdown or sign of open fracture or pinching of the skin. I asked that her x-rays be obtained.priority. This was done almost immediately. I discussed reduction with her in the emergency room. She consents to this. After risk and benefits were explained patient underwent conscious sedation and reduction of fracture dislocation.  Procedural sedation Performed by: Lolita Patella Consent: Verbal consent obtained. Risks and benefits: risks, benefits and alternatives were discussed Required items: required blood products, implants, devices, and special equipment available Patient identity confirmed: arm band and provided demographic data Time out: Immediately prior to procedure a "time out" was called to verify the correct patient, procedure, equipment, support staff and site/side marked as required.  Sedation type: moderate (conscious) sedation NPO time confirmed and considedered  Sedatives: PROPOFOL  Physician Time at Bedside: 16min  Vitals: Vital signs were monitored during sedation. Cardiac Monitor, pulse oximeter Patient tolerance: Patient tolerated the procedure well with no immediate complications. Comments: Pt with uneventful recovered. Returned to pre-procedural sedation baseline  Reduction of dislocation Date/Time: 1:43 PM Performed by: Lolita Patella Authorized by: Lolita Patella Consent: Verbal consent obtained. Risks and benefits: risks, benefits and alternatives were discussed Consent given by: patient Required items: required blood products, implants, devices, and special equipment available Time out: Immediately prior to procedure a "time out" was called to verify the correct patient, procedure, equipment, support staff and site/side marked as required.  Patient sedated: yes  Vitals: Vital signs were monitored during  sedation. Patient tolerance: Patient tolerated the procedure well with no immediate complications. Joint: Right ankle/Talo-tibia Reduction technique: eversion, plantarflesion, inversion  Postprocedure shows the skin to be soft and with no additional stretching or tenting or compromise. There is excellent reduction of medial and lateral malleoli. However, there is still some upward displacement of posterior fracture and incomplete reduction of the talotibial joint and anterior posterior plane.   Post procedure there is marked improvement of DP pulse and the toes are pink and warm  I discussed the case with Dr. Fredonia Highland. He has presented to the emergency room. I performed an additional conscious sedation as he is performed reduction the patient was again splinted. Post reduction films show excellent reduction.   Procedural sedation Performed by:  Jeneen Rinks, [place] Consent: Verbal consent obtained. Risks and benefits: risks, benefits and alternatives were discussed Required items: required blood products, implants, devices, and special equipment available Patient identity confirmed: arm band and provided demographic data Time out: Immediately prior to procedure a "time out" was called to verify the correct patient, procedure, equipment, support staff and site/side marked as required.  Sedation type: moderate (conscious) sedation NPO time confirmed and considedered  Sedatives: PROPOFOL  Physician Time at Bedside: 70min   Vitals: Vital signs were monitored during sedation. Cardiac Monitor, pulse oximeter Patient tolerance: Patient tolerated the procedure well with no immediate complications. Comments: Pt with uneventful recovered. Returned to pre-procedural sedation baseline            Tanna Furry, MD 05/01/15 1347

## 2015-05-04 HISTORY — PX: ANKLE FRACTURE SURGERY: SHX122

## 2015-06-03 ENCOUNTER — Other Ambulatory Visit: Payer: Self-pay | Admitting: Cardiology

## 2015-10-04 ENCOUNTER — Other Ambulatory Visit: Payer: Self-pay | Admitting: Cardiology

## 2015-10-07 ENCOUNTER — Other Ambulatory Visit: Payer: Self-pay | Admitting: Cardiology

## 2015-11-21 ENCOUNTER — Ambulatory Visit (INDEPENDENT_AMBULATORY_CARE_PROVIDER_SITE_OTHER): Payer: Medicare Other | Admitting: Cardiovascular Disease

## 2015-11-21 ENCOUNTER — Encounter: Payer: Self-pay | Admitting: Cardiovascular Disease

## 2015-11-21 VITALS — BP 121/68 | HR 64 | Ht 66.0 in | Wt 185.0 lb

## 2015-11-21 DIAGNOSIS — I5181 Takotsubo syndrome: Secondary | ICD-10-CM | POA: Diagnosis not present

## 2015-11-21 DIAGNOSIS — E785 Hyperlipidemia, unspecified: Secondary | ICD-10-CM

## 2015-11-21 DIAGNOSIS — I10 Essential (primary) hypertension: Secondary | ICD-10-CM

## 2015-11-21 NOTE — Progress Notes (Signed)
Patient ID: Julie Jefferson, female   DOB: 04/30/1946, 69 y.o.   MRN: [id number]      SUBJECTIVE: The patient is a 69 year old woman with a history of syncope and Takotsubo cardiomyopathy in 2008. Her daughter is Julie Jefferson Investment banker, corporate), who works for [employer]  This is my first time meeting her. She was previously seen by Dr. Ron Parker. She had a presyncopal episode in 2014 felt secondary to volume depletion. Coronary angiography in November 2008 showed minimal coronary artery disease.  Most recent echocardiogram on 05/12/13 demonstrated normal left ventricular systolic and diastolic function, EF 123456, and normal regional wall motion.  She denies chest pain, palpitations, and shortness of breath. She is feeling very well.  She fractured her right ankle over the summer and stayed with her daughter and her family. She enjoys spending time with her granddaughter, Julie Jefferson.  ECG performed in the office today demonstrates normal sinus rhythm with no ischemic ST segment or T-wave abnormalities, nor any arrhythmias.    Review of Systems: As per "subjective", otherwise negative.  Allergies  Allergen Reactions  . Alphagan [Brimonidine] Other (See Comments)    Dries eyes out.   . Dorzolamide Hcl Other (See Comments)    Dried eyes out.  Marland Kitchen Penicillins     Current Outpatient Prescriptions  Medication Sig Dispense Refill  . acetaminophen (TYLENOL) 650 MG CR tablet Take 650 mg by mouth every 8 (eight) hours as needed for pain.    Marland Kitchen amLODipine (NORVASC) 5 MG tablet Take 1 tablet by mouth Daily.    Marland Kitchen aspirin 81 MG EC tablet Take 81 mg by mouth daily.      . bimatoprost (LUMIGAN) 0.03 % ophthalmic solution Place 1 drop into both eyes at bedtime.     . carvedilol (COREG) 3.125 MG tablet TAKE 1 TABLET BY MOUTH TWICE A DAY WITH A MEAL 60 tablet 6  . levothyroxine (SYNTHROID, LEVOTHROID) 100 MCG tablet Take 100 mcg by mouth daily.      . meclizine (ANTIVERT) 25 MG tablet Take 25 mg by mouth 3 (three) times daily  as needed for dizziness.     Marland Kitchen omeprazole (PRILOSEC) 40 MG capsule Take 40 mg by mouth daily.      Marland Kitchen oxyCODONE-acetaminophen (PERCOCET/ROXICET) 5-325 MG per tablet Take 2 tablets by mouth every 4 (four) hours as needed. 30 tablet 0  . polyethylene glycol powder (GLYCOLAX/MIRALAX) powder Take 17 g by mouth as needed for mild constipation.     . simvastatin (ZOCOR) 10 MG tablet TAKE 1 TABLET BY MOUTH AT BEDTIME 30 tablet 2   No current facility-administered medications for this visit.    Past Medical History  Diagnosis Date  . Takotsubo syndrome     cardiac cath 11/08. minimal coronary disease, anteroseptal akinesis with an EF 55%.  echo 2/09 EF 60-65% (LV function normalized)  . HTN (hypertension)   . HLD (hyperlipidemia)     mixed  . Hypothyroidism   . GERD (gastroesophageal reflux disease)   . Ejection fraction     EF 60-65%, echo, February, 2009, (LV function normalized).  . Cough     ACE cough 2012  . History of breast cancer     left  . Arthritis   . Takotsubo cardiomyopathy   . Chest pain     February, 2014  . Pre-syncope     Hospital, June, 2014, probably dehydration    Past Surgical History  Procedure Laterality Date  . Cholecystectomy    . Breast lumpectomy  2005  left breast  . Ganglion cyst excision      left  . Bunionectomy      left  . Breast biopsy  01/03/2012  . Total abdominal hysterectomy  1997    Social History   Social History  . Marital Status: Widowed    Spouse Name: N/A  . Number of Children: N/A  . Years of Education: N/A   Occupational History  . Not on file.   Social History Main Topics  . Smoking status: Never Smoker   . Smokeless tobacco: Never Used     Comment: tobacco use - no   . Alcohol Use: No  . Drug Use: No  . Sexual Activity: Not on file   Other Topics Concern  . Not on file   Social History Narrative   Widowed, works full time, gets regular exercise.      Filed Vitals:   11/21/15 1136  BP: 121/68  Pulse:  64  Height: 5\' 6"  (1.676 m)  Weight: 185 lb (83.915 kg)    PHYSICAL EXAM General: NAD HEENT: Normal. Neck: No JVD, no thyromegaly. Lungs: Clear to auscultation bilaterally with normal respiratory effort. CV: Nondisplaced PMI.  Regular rate and rhythm, normal S1/S2, no S3/S4, no murmur. No pretibial or periankle edema.  No carotid bruit.  Normal pedal pulses.  Abdomen: Soft, nontender, no distention.  Neurologic: Alert and oriented x 3.  Psych: Normal affect. Skin: Normal. Musculoskeletal: Normal range of motion, no gross deformities. Extremities: No clubbing or cyanosis.   ECG: Most recent ECG reviewed.      ASSESSMENT AND PLAN: 1. Takotsubo cardiomyopathy:  Subsequent normalization of left ventricular systolic function as noted above. Continue Coreg.  2. Essential HTN: Controlled. Continue amlodipine and Coreg.  3. Hyperlipidemia: Continue simvastatin 10 mg.  Dispo: f/u 1 year.   Kate Sable, M.D., F.A.C.C.

## 2015-11-21 NOTE — Patient Instructions (Signed)
Continue all current medications. Your physician wants you to follow up in:  1 year.  You will receive a reminder letter in the mail one-two months in advance.  If you don't receive a letter, please call our office to schedule the follow up appointment   

## 2016-01-03 ENCOUNTER — Other Ambulatory Visit: Payer: Self-pay | Admitting: Cardiology

## 2016-01-07 ENCOUNTER — Other Ambulatory Visit: Payer: Self-pay | Admitting: Cardiology

## 2016-03-06 ENCOUNTER — Telehealth: Payer: Self-pay | Admitting: *Deleted

## 2016-03-06 NOTE — Telephone Encounter (Signed)
Julie Jefferson with Dr. Jossie Ng office calling for Julie Jefferson (anesthesia).  Patient is having colonoscopy on Thursday, 4/6 and anesthesia is requesting cardiac clearance.    Fax also placed in Dr. Bronson Ing box today.

## 2016-03-08 NOTE — Telephone Encounter (Signed)
Faxed

## 2016-03-08 NOTE — Telephone Encounter (Signed)
Can safely proceed.

## 2016-03-15 ENCOUNTER — Other Ambulatory Visit: Payer: Self-pay | Admitting: *Deleted

## 2016-03-15 MED ORDER — CARVEDILOL 3.125 MG PO TABS: 3.1250 mg | ORAL_TABLET | Freq: Two times a day (BID) | ORAL | Status: DC

## 2016-03-15 MED ORDER — SIMVASTATIN 10 MG PO TABS: 10.0000 mg | ORAL_TABLET | Freq: Every day | ORAL | Status: DC

## 2016-09-02 ENCOUNTER — Emergency Department (HOSPITAL_COMMUNITY)
Admission: EM | Admit: 2016-09-02 | Discharge: 2016-09-02 | Disposition: A | Discharge status: Home / Self Care | Payer: Medicare Other | Attending: Emergency Medicine | Admitting: Emergency Medicine

## 2016-09-02 ENCOUNTER — Encounter (HOSPITAL_COMMUNITY): Payer: Self-pay | Admitting: Emergency Medicine

## 2016-09-02 ENCOUNTER — Emergency Department (HOSPITAL_COMMUNITY): Payer: Medicare Other

## 2016-09-02 DIAGNOSIS — R079 Chest pain, unspecified: Secondary | ICD-10-CM | POA: Diagnosis present

## 2016-09-02 DIAGNOSIS — I10 Essential (primary) hypertension: Secondary | ICD-10-CM | POA: Diagnosis not present

## 2016-09-02 DIAGNOSIS — Z7982 Long term (current) use of aspirin: Secondary | ICD-10-CM | POA: Diagnosis not present

## 2016-09-02 DIAGNOSIS — R0789 Other chest pain: Secondary | ICD-10-CM | POA: Insufficient documentation

## 2016-09-02 DIAGNOSIS — E039 Hypothyroidism, unspecified: Secondary | ICD-10-CM | POA: Insufficient documentation

## 2016-09-02 DIAGNOSIS — Z853 Personal history of malignant neoplasm of breast: Secondary | ICD-10-CM | POA: Diagnosis not present

## 2016-09-02 LAB — COMPREHENSIVE METABOLIC PANEL
ALBUMIN: 4.1 g/dL (ref 3.5–5.0)
ALK PHOS: 61 U/L (ref 38–126)
ALT: 22 U/L (ref 14–54)
AST: 26 U/L (ref 15–41)
Anion gap: 10 (ref 5–15)
BILIRUBIN TOTAL: 0.5 mg/dL (ref 0.3–1.2)
BUN: 21 mg/dL — AB (ref 6–20)
CALCIUM: 9.3 mg/dL (ref 8.9–10.3)
CO2: 21 mmol/L — ABNORMAL LOW (ref 22–32)
Chloride: 107 mmol/L (ref 101–111)
Creatinine, Ser: 0.73 mg/dL (ref 0.44–1.00)
GFR calc Af Amer: >60 mL/min (ref >60)
GFR calc non Af Amer: >60 mL/min (ref >60)
GLUCOSE: 93 mg/dL (ref 65–99)
Potassium: 4.2 mmol/L (ref 3.5–5.1)
Sodium: 138 mmol/L (ref 135–145)
TOTAL PROTEIN: 7.1 g/dL (ref 6.5–8.1)

## 2016-09-02 LAB — I-STAT TROPONIN, ED
Troponin i, poc: 0.02 ng/mL (ref 0.00–0.08)
Troponin i, poc: 0 ng/mL (ref 0.00–0.08)

## 2016-09-02 LAB — CBC
HEMATOCRIT: 37.9 % (ref 36.0–46.0)
HEMOGLOBIN: 12.3 g/dL (ref 12.0–15.0)
MCH: 29.3 pg (ref 26.0–34.0)
MCHC: 32.5 g/dL (ref 30.0–36.0)
MCV: 90.2 fL (ref 78.0–100.0)
Platelets: 266 10*3/uL (ref 150–400)
RBC: 4.2 MIL/uL (ref 3.87–5.11)
RDW: 13.5 % (ref 11.5–15.5)
WBC: 5.8 10*3/uL (ref 4.0–10.5)

## 2016-09-02 LAB — MAGNESIUM: Magnesium: 2 mg/dL (ref 1.7–2.4)

## 2016-09-02 LAB — PROTIME-INR
INR: 1.07
Prothrombin Time: 13.9 seconds (ref 11.4–15.2)

## 2016-09-02 NOTE — ED Provider Notes (Signed)
[facility] DEPT Provider Note   CSN: [id number] Arrival date & time: 09/02/16  1139     History   Chief Complaint Chief Complaint  Patient presents with  . chest pain    HPI Julie Jefferson is a 70 y.o. female.  Pt presents to the ED today with CP.  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  Pt had Takotsubo syndrome and cardiomyopathy in 2008.  At that time, her cath showed normal coronary arteries.  Her last ECHO was in June of 2014 and showed normalization of her cardiac fct with an EF on 60%.  Her last visit with her cardiologist Dr. Bronson Ing was 11/2015 and she was well then.  No recent stress tests or caths.  Pt has been experiencing "electricity" in her chest going to her left jaw periodically over the last 2 weeks.  It usually lasts just a few seconds.  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  This prompted her to come here.  She took her usual ASA this am.  She did not require nitro.  She denies any current cp or sob.      Past Medical History:  Diagnosis Date  . Arthritis   . Chest pain    February, 2014  . Cough    ACE cough 2012  . Ejection fraction    EF 60-65%, echo, February, 2009, (LV function normalized).  . GERD (gastroesophageal reflux disease)   . History of breast cancer    left  . HLD (hyperlipidemia)    mixed  . HTN (hypertension)   . Hypothyroidism   . Pre-syncope    Hospital, June, 2014, probably dehydration  . Takotsubo cardiomyopathy   . Takotsubo syndrome    cardiac cath 11/08. minimal coronary disease, anteroseptal akinesis with an EF 55%.  echo 2/09 EF 60-65% (LV function normalized)    Patient Active Problem List   Diagnosis Date Noted  . Glaucoma 11/17/2013  . Pre-syncope 05/12/2013  . Precordial pain 05/12/2013  . Near syncope - possible dehydration 05/12/2013  . Breast cancer ([place]) 04/20/2013  . Family history of malignant neoplasm of breast 04/20/2013  . Left  breast lump 01/02/2012  . Cough   . Takotsubo syndrome   . HTN (hypertension)   . HLD (hyperlipidemia)   . Hypothyroidism   . GERD (gastroesophageal reflux disease)   . Ejection fraction   . DIAPHRAGMAT HERN W/O MENTION OBSTRUCTION/GANGREN 10/23/2010    Past Surgical History:  Procedure Laterality Date  . BREAST BIOPSY  01/03/2012  . BREAST LUMPECTOMY  2005   left breast  . BUNIONECTOMY     left  . cholecystectomy    . GANGLION CYST EXCISION     left  . TOTAL ABDOMINAL HYSTERECTOMY  1997    OB History    No data available       Home Medications    Prior to Admission medications   Medication Sig Start Date End Date Taking? Authorizing Provider  acetaminophen (TYLENOL) 650 MG CR tablet Take 650 mg by mouth at bedtime.    Yes Historical Provider, MD  amLODipine (NORVASC) 5 MG tablet Take 5 mg by mouth Daily.  10/03/11  Yes Historical Provider, MD  aspirin 81 MG EC tablet Take 81 mg by mouth every morning.    Yes Historical Provider, MD  bimatoprost (LUMIGAN) 0.03 % ophthalmic solution Place 1 drop into both eyes at bedtime.    Yes Historical Provider,  MD  carvedilol (COREG) 3.125 MG tablet Take 1 tablet (3.125 mg total) by mouth 2 (two) times daily. 03/15/16  Yes Herminio Commons, MD  levothyroxine (SYNTHROID, LEVOTHROID) 100 MCG tablet Take 100 mcg by mouth daily.     Yes Historical Provider, MD  meclizine (ANTIVERT) 25 MG tablet Take 25 mg by mouth 3 (three) times daily as needed for dizziness.    Yes Historical Provider, MD  omeprazole (PRILOSEC) 40 MG capsule Take 40 mg by mouth daily.     Yes Historical Provider, MD  polyethylene glycol powder (GLYCOLAX/MIRALAX) powder Take 17 g by mouth as needed for mild constipation.    Yes Historical Provider, MD  simvastatin (ZOCOR) 10 MG tablet Take 1 tablet (10 mg total) by mouth at bedtime. 03/15/16  Yes Herminio Commons, MD  timolol (BETIMOL) 0.5 % ophthalmic solution Place 1 drop into both eyes every morning.   Yes  Historical Provider, MD    Family History Family History  Problem Relation Age of Onset  . Cancer Mother 58    breast  . Heart disease Father   . Coronary artery disease      family hx  . Cancer Sister     LCIS 57  . Cancer Maternal Aunt 54    breast    Social History Social History  Substance Use Topics  . Smoking status: Never Smoker  . Smokeless tobacco: Never Used     Comment: tobacco use - no   . Alcohol use No     Allergies   Alphagan [brimonidine]; Dorzolamide hcl; and Penicillins   Review of Systems Review of Systems  Cardiovascular: Positive for chest pain.  All other systems reviewed and are negative.    Physical Exam Updated Vital Signs BP 126/67   Pulse 67   Temp 98.3 F (36.8 C) (Oral)   Resp 11   Ht 5\' 5"  (1.651 m)   Wt 185 lb (83.9 kg)   SpO2 100%   BMI 30.79 kg/m   Physical Exam  Constitutional: She appears well-developed and well-nourished.  HENT:  Head: Normocephalic and atraumatic.  Right Ear: External ear normal.  Left Ear: External ear normal.  Nose: Nose normal.  Mouth/Throat: Oropharynx is clear and moist.  Eyes: Conjunctivae and EOM are normal. Pupils are equal, round, and reactive to light.  Neck: Normal range of motion. Neck supple.  Cardiovascular: Normal rate, regular rhythm, normal heart sounds and intact distal pulses.   Pulmonary/Chest: Effort normal and breath sounds normal.  Abdominal: Soft. Bowel sounds are normal.  Musculoskeletal: Normal range of motion.  Neurological: She is alert. She has normal reflexes.  Skin: Skin is warm.  Psychiatric: She has a normal mood and affect. Her behavior is normal. Judgment and thought content normal.  Nursing note and vitals reviewed.    ED Treatments / Results  Labs (all labs ordered are listed, but only abnormal results are displayed) Labs Reviewed  COMPREHENSIVE METABOLIC PANEL - Abnormal; Notable for the following:       Result Value   CO2 21 (*)    BUN 21 (*)     All other components within normal limits  CBC  MAGNESIUM  PROTIME-INR  URINALYSIS, ROUTINE W REFLEX MICROSCOPIC (NOT AT [facility])  TROPONIN I  Randolm Idol, ED  Randolm Idol, ED  Randolm Idol, ED    EKG  EKG Interpretation  Date/Time:  Sunday September 02 2016 11:49:09 EDT Ventricular Rate:  67 PR Interval:  130 QRS Duration: 96 QT Interval:  422 QTC Calculation: 445 R Axis:   40 Text Interpretation:  Normal sinus rhythm Low voltage QRS Borderline ECG Confirmed by Gilford Raid MD, Kimla Furth ([id number]) on 09/02/2016 12:20:23 PM       Radiology Dg Chest 2 View  Result Date: 09/02/2016 CLINICAL DATA:  70 year old female with history of mid chest pain since 10/15 this morning. History of breast cancer. Prior history of Takotsubo cardiomyopathy in 2008. EXAM: CHEST  2 VIEW COMPARISON:  Chest x-ray 05/11/2013. FINDINGS: Lung volumes are normal. No consolidative airspace disease. No pleural effusions. No pneumothorax. No pulmonary nodule or mass noted. Pulmonary vasculature and the cardiomediastinal silhouette are within normal limits. Atherosclerosis in the thoracic aorta. Surgical clips project over the left axillary region, presumably from prior lymph node dissection. Multiple surgical clips are also noted overlying the left breast, presumably from lumpectomy. Surgical clips project over the right upper quadrant of the abdomen, presumably from prior cholecystectomy. IMPRESSION: 1.  No radiographic evidence of acute cardiopulmonary disease. 2. Aortic atherosclerosis. Electronically Signed   By: Vinnie Langton M.D.   On: 09/02/2016 12:43    Procedures Procedures (including critical care time)  Medications Ordered in ED Medications - No data to display   Initial Impression / Assessment and Plan / ED Course  I have reviewed the triage vital signs and the nursing notes.  Pertinent labs & imaging results that were available during my care of the patient were reviewed by me and considered  in my medical decision making (see chart for details).  Clinical Course   Pt's pain is very atypical.  She has had 2 sets of negative troponins.  She has not seen her cardiologist for over 1.5 years.  She knows to call him tomorrow to schedule an appt and possibly an outpatient stress test.  Final Clinical Impressions(s) / ED Diagnoses   Final diagnoses:  Nonspecific chest pain    New Prescriptions New Prescriptions   No medications on file     Isla Pence, MD 09/02/16 1519

## 2016-09-02 NOTE — ED Notes (Signed)
Pt aware that urine specimen is needed. Pt not able to use restroom.

## 2016-09-02 NOTE — ED Notes (Signed)
Papers reviewed and pt. Denies questions. Leaving with family today and denies any pain

## 2016-09-02 NOTE — ED Triage Notes (Signed)
C/o chest pain midsternal chest pain radiates to back and right shoulder and left neck. Was more active yesterday than normal-- worked in garden, Chiropodist,  Took ASA 324mg  prior to arrival

## 2016-09-02 NOTE — ED Notes (Signed)
Daughter came out to say that pt just told her sister that she had a little twinge of pain on left side of chest. Per pt no longer having any pain.

## 2016-09-05 ENCOUNTER — Encounter: Payer: Self-pay | Admitting: *Deleted

## 2016-09-06 ENCOUNTER — Ambulatory Visit (INDEPENDENT_AMBULATORY_CARE_PROVIDER_SITE_OTHER): Payer: Medicare Other | Admitting: Cardiovascular Disease

## 2016-09-06 ENCOUNTER — Encounter: Payer: Self-pay | Admitting: Cardiovascular Disease

## 2016-09-06 VITALS — BP 148/84 | HR 66 | Ht 65.0 in | Wt 188.0 lb

## 2016-09-06 DIAGNOSIS — I5181 Takotsubo syndrome: Secondary | ICD-10-CM

## 2016-09-06 DIAGNOSIS — R0789 Other chest pain: Secondary | ICD-10-CM

## 2016-09-06 DIAGNOSIS — E7800 Pure hypercholesterolemia, unspecified: Secondary | ICD-10-CM

## 2016-09-06 DIAGNOSIS — Z9289 Personal history of other medical treatment: Secondary | ICD-10-CM

## 2016-09-06 DIAGNOSIS — I10 Essential (primary) hypertension: Secondary | ICD-10-CM | POA: Diagnosis not present

## 2016-09-06 NOTE — Patient Instructions (Signed)
Medication Instructions:  Continue all current medications.  Labwork: none  Testing/Procedures: none  Follow-Up: 3 months   Any Other Special Instructions Will Be Listed Below (If Applicable).  If you need a refill on your cardiac medications before your next appointment, please call your pharmacy.  

## 2016-09-06 NOTE — Progress Notes (Signed)
SUBJECTIVE: The patient presents for routine follow-up. She has a history of syncope and Takotsubo cardiomyopathy in 2008. Her daughter is [name], corporate), who works for [employer]  She had a presyncopal episode in 2014 felt secondary to volume depletion. Coronary angiography in November 2008 showed minimal coronary artery disease.  Most recent echocardiogram on 05/12/13 demonstrated normal left ventricular systolic and diastolic function, EF 123456, and normal regional wall motion.  She was evaluated for chest pain in the ED on 09/02/16. 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 She described an "electrical sensation "bleeding from her chest to her jaw lasting seconds.  Troponins were normal. Chest x-ray showed no acute cardiopulmonary disease.  ECG showed NSR.  At the time of the event, she experienced retrosternal chest pain radiating between her shoulder blades. She also had a constricting feeling around her neck. She's had no symptoms like this since her last visit with me in December 2016. The day before this, she had been working outdoors in the yard for at least 12 hours and then worked indoors. She tries to stay hydrated. She drinks 3 cups of coffee every morning. She had been taking a dietary supplement with additional caffeine as well. Color looked pale as per daughter.   Review of Systems: As per "subjective", otherwise negative.  Allergies  Allergen Reactions  . Alphagan [Brimonidine] Other (See Comments)    Dries eyes out.   . Dorzolamide Hcl Other (See Comments)    Dried eyes out.  Marland Kitchen Penicillins Other (See Comments)    Has patient had a PCN reaction causing immediate rash, facial/tongue/throat swelling, SOB or lightheadedness with hypotension: Unknown childhood reaction Has patient had a PCN reaction causing severe rash involving mucus membranes or skin necrosis: No Has patient had a PCN reaction that required hospitalization No Has patient had a PCN reaction  occurring within the last 10 years: No If all of the above answers are "NO", then may proceed with Cephalosporin use.     Current Outpatient Prescriptions  Medication Sig Dispense Refill  . acetaminophen (TYLENOL) 650 MG CR tablet Take 650 mg by mouth at bedtime.     Marland Kitchen amLODipine (NORVASC) 5 MG tablet Take 5 mg by mouth Daily.     Marland Kitchen aspirin 81 MG EC tablet Take 81 mg by mouth every morning.     . bimatoprost (LUMIGAN) 0.03 % ophthalmic solution Place 1 drop into both eyes at bedtime.     . carvedilol (COREG) 3.125 MG tablet Take 1 tablet (3.125 mg total) by mouth 2 (two) times daily. 180 tablet 3  . levothyroxine (SYNTHROID, LEVOTHROID) 100 MCG tablet Take 100 mcg by mouth daily.      . meclizine (ANTIVERT) 25 MG tablet Take 25 mg by mouth 3 (three) times daily as needed for dizziness.     Marland Kitchen omeprazole (PRILOSEC) 40 MG capsule Take 40 mg by mouth daily.      . polyethylene glycol powder (GLYCOLAX/MIRALAX) powder Take 17 g by mouth as needed for mild constipation.     . simvastatin (ZOCOR) 10 MG tablet Take 1 tablet (10 mg total) by mouth at bedtime. 90 tablet 3  . timolol (BETIMOL) 0.5 % ophthalmic solution Place 1 drop into both eyes every morning.     No current facility-administered medications for this visit.     Past Medical History:  Diagnosis Date  . Arthritis   . Chest pain    February, 2014  . Cough  ACE cough 2012  . Ejection fraction    EF 60-65%, echo, February, 2009, (LV function normalized).  . GERD (gastroesophageal reflux disease)   . History of breast cancer    left  . HLD (hyperlipidemia)    mixed  . HTN (hypertension)   . Hypothyroidism   . Pre-syncope    Hospital, June, 2014, probably dehydration  . Takotsubo cardiomyopathy   . Takotsubo syndrome    cardiac cath 11/08. minimal coronary disease, anteroseptal akinesis with an EF 55%.  echo 2/09 EF 60-65% (LV function normalized)    Past Surgical History:  Procedure Laterality Date  . BREAST BIOPSY   01/03/2012  . BREAST LUMPECTOMY  2005   left breast  . BUNIONECTOMY     left  . cholecystectomy    . GANGLION CYST EXCISION     left  . TOTAL ABDOMINAL HYSTERECTOMY  1997    Social History   Social History  . Marital status: Widowed    Spouse name: N/A  . Number of children: N/A  . Years of education: N/A   Occupational History  . Not on file.   Social History Main Topics  . Smoking status: Never Smoker  . Smokeless tobacco: Never Used     Comment: tobacco use - no   . Alcohol use No  . Drug use: No  . Sexual activity: Not on file   Other Topics Concern  . Not on file   Social History Narrative   Widowed, works full time, gets regular exercise.      Vitals:   09/06/16 1121  BP: (!) 148/84  Pulse: 66  SpO2: 98%  Weight: 188 lb (85.3 kg)  Height: 5\' 5"  (1.651 m)    PHYSICAL EXAM General: NAD HEENT: Normal. Neck: No JVD, no thyromegaly. Lungs: Clear to auscultation bilaterally with normal respiratory effort. CV: Nondisplaced PMI.  Regular rate and rhythm, normal S1/S2, no S3/S4, no murmur. No pretibial or periankle edema.  No carotid bruit.   Abdomen: Soft, nontender, no distention.  Neurologic: Alert and oriented.  Psych: Normal affect. Skin: Normal. Musculoskeletal: No gross deformities.    ECG: Most recent ECG reviewed.      ASSESSMENT AND PLAN: 1. Takotsubo cardiomyopathy:  Subsequent normalization of left ventricular systolic function as noted above. Continue Coreg.  2. Essential HTN: Mildly elevated. Continue amlodipine and Coreg. BP at home is usually normal and checked by her daughter who is an Therapist, sports.  3. Hyperlipidemia: Continue simvastatin 10 mg.  4. Chest pain: Symptoms appear atypical for ischemic heart disease. Minimal CAD in 2008 by angiography. Will continue to observe for symptom recurrence. This may have been due to dehydration.  Dispo: fu 3 months.   Kate Sable, M.D., F.A.C.C.

## 2016-12-07 ENCOUNTER — Ambulatory Visit (INDEPENDENT_AMBULATORY_CARE_PROVIDER_SITE_OTHER): Payer: Medicare Other | Admitting: Cardiovascular Disease

## 2016-12-07 ENCOUNTER — Encounter: Payer: Self-pay | Admitting: *Deleted

## 2016-12-07 VITALS — BP 142/72 | HR 61 | Ht 65.0 in | Wt 196.0 lb

## 2016-12-07 DIAGNOSIS — R0789 Other chest pain: Secondary | ICD-10-CM

## 2016-12-07 DIAGNOSIS — Z23 Encounter for immunization: Secondary | ICD-10-CM

## 2016-12-07 DIAGNOSIS — I5181 Takotsubo syndrome: Secondary | ICD-10-CM

## 2016-12-07 DIAGNOSIS — E7800 Pure hypercholesterolemia, unspecified: Secondary | ICD-10-CM | POA: Diagnosis not present

## 2016-12-07 DIAGNOSIS — I10 Essential (primary) hypertension: Secondary | ICD-10-CM | POA: Diagnosis not present

## 2016-12-07 NOTE — Progress Notes (Signed)
SUBJECTIVE: The patient presents for routine follow-up. She has a history of syncope and Takotsubo cardiomyopathy in 2008. Her daughter is [name], corporate), who works for [employer]  She had a presyncopal episode in 2014 felt secondary to volume depletion. Coronary angiography in November 2008 showed minimal coronary artery disease.  Most recent echocardiogram on 05/12/13 demonstrated normal left ventricular systolic and diastolic function, EF 123456, and normal regional wall motion.  She was evaluated for chest pain in the ED on 09/02/16. 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 She described an "electrical sensation "bleeding from her chest to her jaw lasting seconds.  Troponins were normal. Chest x-ray showed no acute cardiopulmonary disease.  ECG showed NSR.  She has had no further episodes of chest pain. She is being scheduled for left partial knee replacement by Dr. Percell Miller.   Review of Systems: As per "subjective", otherwise negative.  Allergies  Allergen Reactions  . Alphagan [Brimonidine] Other (See Comments)    Dries eyes out.   . Dorzolamide Hcl Other (See Comments)    Dried eyes out.  Marland Kitchen Penicillins Other (See Comments)    Has patient had a PCN reaction causing immediate rash, facial/tongue/throat swelling, SOB or lightheadedness with hypotension: Unknown childhood reaction Has patient had a PCN reaction causing severe rash involving mucus membranes or skin necrosis: No Has patient had a PCN reaction that required hospitalization No Has patient had a PCN reaction occurring within the last 10 years: No If all of the above answers are "NO", then may proceed with Cephalosporin use.     Current Outpatient Prescriptions  Medication Sig Dispense Refill  . acetaminophen (TYLENOL) 650 MG CR tablet Take 650 mg by mouth at bedtime.     Marland Kitchen amLODipine (NORVASC) 5 MG tablet Take 5 mg by mouth Daily.     Marland Kitchen aspirin 81 MG EC tablet Take 81 mg by mouth every morning.     .  bimatoprost (LUMIGAN) 0.03 % ophthalmic solution Place 1 drop into both eyes at bedtime.     . carvedilol (COREG) 3.125 MG tablet Take 1 tablet (3.125 mg total) by mouth 2 (two) times daily. 180 tablet 3  . levothyroxine (SYNTHROID, LEVOTHROID) 100 MCG tablet Take 100 mcg by mouth daily.      . meclizine (ANTIVERT) 25 MG tablet Take 25 mg by mouth 3 (three) times daily as needed for dizziness.     Marland Kitchen omeprazole (PRILOSEC) 40 MG capsule Take 40 mg by mouth daily.      . polyethylene glycol powder (GLYCOLAX/MIRALAX) powder Take 17 g by mouth as needed for mild constipation.     . simvastatin (ZOCOR) 10 MG tablet Take 1 tablet (10 mg total) by mouth at bedtime. 90 tablet 3  . timolol (BETIMOL) 0.5 % ophthalmic solution Place 1 drop into both eyes every morning.     No current facility-administered medications for this visit.     Past Medical History:  Diagnosis Date  . Arthritis   . Chest pain    February, 2014  . Cough    ACE cough 2012  . Ejection fraction    EF 60-65%, echo, February, 2009, (LV function normalized).  . GERD (gastroesophageal reflux disease)   . History of breast cancer    left  . HLD (hyperlipidemia)    mixed  . HTN (hypertension)   . Hypothyroidism   . Pre-syncope    Hospital, June, 2014, probably dehydration  . Takotsubo cardiomyopathy   . Takotsubo  syndrome    cardiac cath 11/08. minimal coronary disease, anteroseptal akinesis with an EF 55%.  echo 2/09 EF 60-65% (LV function normalized)    Past Surgical History:  Procedure Laterality Date  . BREAST BIOPSY  01/03/2012  . BREAST LUMPECTOMY  2005   left breast  . BUNIONECTOMY     left  . cholecystectomy    . GANGLION CYST EXCISION     left  . TOTAL ABDOMINAL HYSTERECTOMY  1997    Social History   Social History  . Marital status: Widowed    Spouse name: N/A  . Number of children: N/A  . Years of education: N/A   Occupational History  . Not on file.   Social History Main Topics  . Smoking  status: Never Smoker  . Smokeless tobacco: Never Used     Comment: tobacco use - no   . Alcohol use No  . Drug use: No  . Sexual activity: Not on file   Other Topics Concern  . Not on file   Social History Narrative   Widowed, works full time, gets regular exercise.      Vitals:   12/07/16 1019  BP: (!) 142/72  Pulse: 61  SpO2: 99%  Weight: 196 lb (88.9 kg)  Height: 5\' 5"  (1.651 m)    PHYSICAL EXAM General: NAD HEENT: Normal. Neck: No JVD, no thyromegaly. Lungs: Clear to auscultation bilaterally with normal respiratory effort. CV: Nondisplaced PMI.  Regular rate and rhythm, normal S1/S2, no S3/S4, no murmur. No pretibial or periankle edema.    Abdomen: Soft, nontender, no distention.  Neurologic: Alert and oriented.  Psych: Normal affect. Skin: Normal. Musculoskeletal: No gross deformities.    ECG: Most recent ECG reviewed.      ASSESSMENT AND PLAN: 1. Takotsubo cardiomyopathy: Subsequent normalization of left ventricular systolic function as noted above. Continue Coreg.  2. Essential HTN: Mildly elevated. Continue amlodipine and Coreg. BP at home is usually normal and checked by her daughter who is an Therapist, sports.  3. Hyperlipidemia: Continue simvastatin 10 mg.  4. Chest pain: No further episodes. Minimal CAD in 2008 by angiography.   Dispo: fu 1 yr  Kate Sable, M.D., F.A.C.C.

## 2016-12-07 NOTE — Patient Instructions (Signed)
Your physician wants you to follow-up in: 1 YEAR WITH DR KONESWARAN You will receive a reminder letter in the mail two months in advance. If you don't receive a letter, please call our office to schedule the follow-up appointment.  Your physician recommends that you continue on your current medications as directed. Please refer to the Current Medication list given to you today.  Thank you for choosing [facility]!!    

## 2016-12-17 ENCOUNTER — Encounter (HOSPITAL_BASED_OUTPATIENT_CLINIC_OR_DEPARTMENT_OTHER)
Admission: RE | Admit: 2016-12-17 | Discharge: 2016-12-17 | Disposition: A | Discharge status: Home / Self Care | Payer: Medicare Other | Source: Ambulatory Visit | Attending: Orthopedic Surgery | Admitting: Orthopedic Surgery

## 2016-12-17 ENCOUNTER — Encounter (HOSPITAL_BASED_OUTPATIENT_CLINIC_OR_DEPARTMENT_OTHER): Payer: Self-pay | Admitting: *Deleted

## 2016-12-17 DIAGNOSIS — M1711 Unilateral primary osteoarthritis, right knee: Secondary | ICD-10-CM | POA: Insufficient documentation

## 2016-12-17 DIAGNOSIS — Z01812 Encounter for preprocedural laboratory examination: Secondary | ICD-10-CM | POA: Insufficient documentation

## 2016-12-17 LAB — SURGICAL PCR SCREEN
MRSA, PCR: NEGATIVE
Staphylococcus aureus: NEGATIVE

## 2016-12-17 NOTE — Progress Notes (Signed)
PCR done, instructions given to patient with [facility] soap, pt verbalized understanding.

## 2016-12-17 NOTE — H&P (Signed)
PREOPERATIVE H&P Patient ID: BRIONE BOTHUN MRN: [id number] DOB/AGE: 71-Nov-1947 71 y.o.  Chief Complaint: RIGHT KNEE OA  Planned Procedure Date: 12/28/16 Cardiac Clearance by Dr. Bronson Ing  HPI: Julie Jefferson is a 71 y.o. female with a history of HTN, HLD, Takotsubo cardiomyopathy, hypothyroidism, and GERD who presents for evaluation of RIGHT KNEE OA. The patient has a history of pain and functional disability in the right knee due to arthritis and has failed non-surgical conservative treatments for greater than 12 weeks to include NSAID's and/or analgesics, corticosteriod injections and activity modification.  Onset of symptoms was gradual, starting 1 years ago with gradually worsening course since that time.  Patient currently rates pain at 7 out of 10 with activity. Patient has night pain, worsening of pain with activity and weight bearing and pain that interferes with activities of daily living.  Patient has evidence of subchondral sclerosis, periarticular osteophytes and joint space narrowing by imaging studies. There is no active infection.  Past Medical History:  Diagnosis Date  . Arthritis    rt knee, back, hands  . Chest pain    February, 2014  . Cough    ACE cough 2012  . Ejection fraction    EF 60-65%, echo, February, 2009, (LV function normalized).  . GERD (gastroesophageal reflux disease)   . History of breast cancer    left  . HLD (hyperlipidemia)    mixed  . HTN (hypertension)   . Hypothyroidism   . Pre-syncope    Hospital, June, 2014, probably dehydration  . Takotsubo cardiomyopathy   . Takotsubo syndrome    cardiac cath 11/08. minimal coronary disease, anteroseptal akinesis with an EF 55%.  echo 2/09 EF 60-65% (LV function normalized)   Past Surgical History:  Procedure Laterality Date  . BREAST BIOPSY  01/03/2012  . BREAST LUMPECTOMY  2005   left breast  . BUNIONECTOMY     left  . cholecystectomy    . GANGLION CYST EXCISION     left  . TOTAL  ABDOMINAL HYSTERECTOMY  1997   Allergies  Allergen Reactions  . Alphagan [Brimonidine] Other (See Comments)    Dries eyes out.   . Dorzolamide Hcl Other (See Comments)    Dried eyes out.  Marland Kitchen Penicillins Other (See Comments)    Has patient had a PCN reaction causing immediate rash, facial/tongue/throat swelling, SOB or lightheadedness with hypotension: Unknown childhood reaction Has patient had a PCN reaction causing severe rash involving mucus membranes or skin necrosis: No Has patient had a PCN reaction that required hospitalization No Has patient had a PCN reaction occurring within the last 10 years: No If all of the above answers are "NO", then may proceed with Cephalosporin use.    Prior to Admission medications   Medication Sig Start Date End Date Taking? Authorizing Provider  amLODipine (NORVASC) 5 MG tablet Take 5 mg by mouth Daily.  10/03/11  Yes Historical Provider, MD  aspirin 81 MG EC tablet Take 81 mg by mouth every morning.    Yes Historical Provider, MD  bimatoprost (LUMIGAN) 0.03 % ophthalmic solution Place 1 drop into both eyes at bedtime.    Yes Historical Provider, MD  carvedilol (COREG) 3.125 MG tablet Take 1 tablet (3.125 mg total) by mouth 2 (two) times daily. 03/15/16  Yes Herminio Commons, MD  levothyroxine (SYNTHROID, LEVOTHROID) 100 MCG tablet Take 100 mcg by mouth daily.     Yes Historical Provider, MD  omeprazole (PRILOSEC) 40 MG capsule Take 40 mg by mouth  daily.     Yes Historical Provider, MD  simvastatin (ZOCOR) 10 MG tablet Take 1 tablet (10 mg total) by mouth at bedtime. 03/15/16  Yes Herminio Commons, MD  timolol (BETIMOL) 0.5 % ophthalmic solution Place 1 drop into both eyes every morning.   Yes Historical Provider, MD  acetaminophen (TYLENOL) 650 MG CR tablet Take 650 mg by mouth at bedtime.     Historical Provider, MD  meclizine (ANTIVERT) 25 MG tablet Take 25 mg by mouth 3 (three) times daily as needed for dizziness.     Historical Provider, MD   polyethylene glycol powder (GLYCOLAX/MIRALAX) powder Take 17 g by mouth as needed for mild constipation.     Historical Provider, MD   Social History   Social History  . Marital status: Widowed    Spouse name: N/A  . Number of children: N/A  . Years of education: N/A   Social History Main Topics  . Smoking status: Never Smoker  . Smokeless tobacco: Never Used     Comment: tobacco use - no   . Alcohol use No  . Drug use: No  . Sexual activity: Not Asked   Other Topics Concern  . None   Social History Narrative   Widowed, works full time, gets regular exercise.    Family History  Problem Relation Age of Onset  . Cancer Mother 78    breast  . Heart disease Father   . Coronary artery disease      family hx  . Cancer Sister     LCIS 68  . Cancer Maternal Aunt 75    breast    ROS: Currently denies lightheadedness, dizziness, Fever, chills, CP, SOB.   No personal history of DVT, PE, MI, or CVA. No loose teeth or dentures All other systems have been reviewed and were otherwise currently negative with the exception of those mentioned in the HPI and as above.  Objective: Vitals: Ht: 5'4 Wt: 192 Temp: 97.6 BP: 147/83 Pulse: 69 O2 97% on room air. Physical Exam: General: Alert, NAD.  Antalgic Gait  HEENT: EOMI, Good Neck Extension  Pulm: No increased work of breathing.  Clear B/L A/P w/o crackle or wheeze.  CV: RRR, No m/g/r appreciated  GI: soft, NT, ND Neuro: Neuro without gross focal deficit.  Sensation intact distally Skin: No lesions in the area of chief complaint MSK/Surgical Site: Right knee w/o redness or effusion. +medial JLT. Correctable varus.  5/5 strength in extension and flexion.  +EHL/FHL.  NVI.  Stable Lachman's and varus and valgus stress.   Imaging Review Plain radiographs demonstrate severe anteromedial degenerative joint disease of the bilateral knees.   Assessment: RIGHT KNEE OA Active Problems:   Takotsubo syndrome   HTN (hypertension)   HLD  (hyperlipidemia)   Hypothyroidism   GERD (gastroesophageal reflux disease)   Family history of malignant neoplasm of breast   Plan: Plan for Procedure(s): RIGHT UNICOMPARTMENTAL KNEE  The patient history, physical exam, clinical judgement of the provider and imaging are consistent with end stage degenerative joint disease and unicompartmental joint arthroplasty is deemed medically necessary. The treatment options including medical management, injection therapy, and arthroplasty were discussed at length. The risks and benefits of Procedure(s): RIGHT UNICOMPARTMENTAL KNEE were presented and reviewed.  The risks of nonoperative treatment, versus surgical intervention including but not limited to continued pain, aseptic loosening, stiffness, dislocation/subluxation, infection, bleeding, nerve injury, blood clots, cardiopulmonary complications, morbidity, mortality, among others were discussed. The patient verbalizes understanding and wishes to proceed  with the plan.  She does verbalize willingness to undergo total knee arthroplasty is this is deemed clinically necessary intraoperatively. Patient is being admitted for inpatient treatment for surgery, pain control, PT, OT, prophylactic antibiotics, VTE prophylaxis, progressive ambulation, ADL's and discharge planning.   Dental prophylaxis discussed and recommended for 2 years postoperatively.  The patient does meet the criteria for TXA which will be used perioperatively via IV.   Xarelto will be used postoperatively for DVT prophylaxis dt breast cancer history in addition to SCDs, and early ambulation. The patient is planning to be discharged home with outpatient PT in care of her daughter who is a Marine scientist.  Prudencio Burly III, PA-C 12/17/2016 6:05 PM

## 2016-12-18 NOTE — Progress Notes (Signed)
PCR negative- patient called

## 2016-12-27 ENCOUNTER — Encounter (HOSPITAL_BASED_OUTPATIENT_CLINIC_OR_DEPARTMENT_OTHER): Payer: Self-pay | Admitting: *Deleted

## 2016-12-27 MED ORDER — TRANEXAMIC ACID 1000 MG/10ML IV SOLN
1000.0000 mg | INTRAVENOUS | Status: AC
  Administered 2016-12-28: 2000 mg via INTRAVENOUS
  Filled 2016-12-27: qty 10

## 2016-12-28 ENCOUNTER — Encounter (HOSPITAL_BASED_OUTPATIENT_CLINIC_OR_DEPARTMENT_OTHER): Payer: Self-pay | Admitting: Anesthesiology

## 2016-12-28 ENCOUNTER — Encounter (HOSPITAL_BASED_OUTPATIENT_CLINIC_OR_DEPARTMENT_OTHER)
Admission: RE | Disposition: A | Discharge status: Home / Self Care | Payer: Self-pay | Source: Ambulatory Visit | Attending: Orthopedic Surgery

## 2016-12-28 ENCOUNTER — Ambulatory Visit (HOSPITAL_COMMUNITY): Payer: Medicare Other

## 2016-12-28 ENCOUNTER — Ambulatory Visit (HOSPITAL_BASED_OUTPATIENT_CLINIC_OR_DEPARTMENT_OTHER)
Admission: RE | Admit: 2016-12-28 | Discharge: 2016-12-29 | Disposition: A | Discharge status: Home / Self Care | Payer: Medicare Other | Source: Ambulatory Visit | Attending: Orthopedic Surgery | Admitting: Orthopedic Surgery

## 2016-12-28 ENCOUNTER — Ambulatory Visit (HOSPITAL_BASED_OUTPATIENT_CLINIC_OR_DEPARTMENT_OTHER): Payer: Medicare Other | Admitting: Anesthesiology

## 2016-12-28 DIAGNOSIS — E7800 Pure hypercholesterolemia, unspecified: Secondary | ICD-10-CM

## 2016-12-28 DIAGNOSIS — K219 Gastro-esophageal reflux disease without esophagitis: Secondary | ICD-10-CM | POA: Diagnosis not present

## 2016-12-28 DIAGNOSIS — E785 Hyperlipidemia, unspecified: Secondary | ICD-10-CM | POA: Diagnosis not present

## 2016-12-28 DIAGNOSIS — Z803 Family history of malignant neoplasm of breast: Secondary | ICD-10-CM

## 2016-12-28 DIAGNOSIS — I5181 Takotsubo syndrome: Secondary | ICD-10-CM

## 2016-12-28 DIAGNOSIS — M19041 Primary osteoarthritis, right hand: Secondary | ICD-10-CM | POA: Insufficient documentation

## 2016-12-28 DIAGNOSIS — M19042 Primary osteoarthritis, left hand: Secondary | ICD-10-CM | POA: Insufficient documentation

## 2016-12-28 DIAGNOSIS — E039 Hypothyroidism, unspecified: Secondary | ICD-10-CM | POA: Diagnosis not present

## 2016-12-28 DIAGNOSIS — Z8249 Family history of ischemic heart disease and other diseases of the circulatory system: Secondary | ICD-10-CM | POA: Insufficient documentation

## 2016-12-28 DIAGNOSIS — Z7982 Long term (current) use of aspirin: Secondary | ICD-10-CM | POA: Diagnosis not present

## 2016-12-28 DIAGNOSIS — G709 Myoneural disorder, unspecified: Secondary | ICD-10-CM | POA: Diagnosis not present

## 2016-12-28 DIAGNOSIS — I10 Essential (primary) hypertension: Secondary | ICD-10-CM

## 2016-12-28 DIAGNOSIS — Z79899 Other long term (current) drug therapy: Secondary | ICD-10-CM | POA: Diagnosis not present

## 2016-12-28 DIAGNOSIS — Z9049 Acquired absence of other specified parts of digestive tract: Secondary | ICD-10-CM | POA: Insufficient documentation

## 2016-12-28 DIAGNOSIS — Z888 Allergy status to other drugs, medicaments and biological substances status: Secondary | ICD-10-CM | POA: Insufficient documentation

## 2016-12-28 DIAGNOSIS — Z90710 Acquired absence of both cervix and uterus: Secondary | ICD-10-CM | POA: Diagnosis not present

## 2016-12-28 DIAGNOSIS — Z880 Allergy status to penicillin: Secondary | ICD-10-CM | POA: Insufficient documentation

## 2016-12-28 DIAGNOSIS — M1711 Unilateral primary osteoarthritis, right knee: Secondary | ICD-10-CM

## 2016-12-28 DIAGNOSIS — Z853 Personal history of malignant neoplasm of breast: Secondary | ICD-10-CM | POA: Insufficient documentation

## 2016-12-28 DIAGNOSIS — S8990XA Unspecified injury of unspecified lower leg, initial encounter: Secondary | ICD-10-CM | POA: Diagnosis present

## 2016-12-28 HISTORY — PX: PARTIAL KNEE ARTHROPLASTY: SHX2174

## 2016-12-28 LAB — POCT HEMOGLOBIN-HEMACUE: Hemoglobin: 12.5 g/dL (ref 12.0–15.0)

## 2016-12-28 SURGERY — ARTHROPLASTY, KNEE, UNICOMPARTMENTAL
Anesthesia: General | Site: Knee | Laterality: Right

## 2016-12-28 MED ORDER — KETOROLAC TROMETHAMINE 15 MG/ML IJ SOLN
7.5000 mg | Freq: Four times a day (QID) | INTRAMUSCULAR | Status: DC
  Administered 2016-12-28 – 2016-12-29 (×3): 7.5 mg via INTRAVENOUS
  Filled 2016-12-28 (×3): qty 1

## 2016-12-28 MED ORDER — PROPOFOL 10 MG/ML IV BOLUS
INTRAVENOUS | Status: AC
Start: 2016-12-28 — End: 2016-12-28
  Filled 2016-12-28: qty 20

## 2016-12-28 MED ORDER — SODIUM CHLORIDE 0.9 % IR SOLN
Status: DC | PRN
  Administered 2016-12-28: 2000 mL

## 2016-12-28 MED ORDER — FENTANYL CITRATE (PF) 100 MCG/2ML IJ SOLN
INTRAMUSCULAR | Status: AC
  Filled 2016-12-28: qty 2

## 2016-12-28 MED ORDER — ACETAMINOPHEN 500 MG PO TABS
ORAL_TABLET | ORAL | Status: AC
  Filled 2016-12-28: qty 2

## 2016-12-28 MED ORDER — PROPOFOL 10 MG/ML IV BOLUS
INTRAVENOUS | Status: AC
  Filled 2016-12-28: qty 20

## 2016-12-28 MED ORDER — TIMOLOL HEMIHYDRATE 0.5 % OP SOLN
1.0000 [drp] | OPHTHALMIC | Status: DC
  Administered 2016-12-29: 1 [drp] via OPHTHALMIC

## 2016-12-28 MED ORDER — LIDOCAINE HCL (CARDIAC) 20 MG/ML IV SOLN
INTRAVENOUS | Status: DC | PRN
  Administered 2016-12-28: 30 mg via INTRAVENOUS

## 2016-12-28 MED ORDER — SIMVASTATIN 10 MG PO TABS
10.0000 mg | ORAL_TABLET | Freq: Every day | ORAL | Status: DC
  Administered 2016-12-28: 10 mg via ORAL

## 2016-12-28 MED ORDER — VANCOMYCIN HCL IN DEXTROSE 1-5 GM/200ML-% IV SOLN
INTRAVENOUS | Status: AC
  Filled 2016-12-28: qty 200

## 2016-12-28 MED ORDER — HYDROMORPHONE HCL 1 MG/ML IJ SOLN: 0.2500 mg | INTRAMUSCULAR | Status: DC | PRN

## 2016-12-28 MED ORDER — ONDANSETRON HCL 4 MG/2ML IJ SOLN: 4.0000 mg | Freq: Four times a day (QID) | INTRAMUSCULAR | Status: DC | PRN

## 2016-12-28 MED ORDER — MORPHINE SULFATE (PF) 2 MG/ML IV SOLN: 2.0000 mg | INTRAVENOUS | Status: DC | PRN

## 2016-12-28 MED ORDER — ONDANSETRON HCL 4 MG/2ML IJ SOLN
INTRAMUSCULAR | Status: AC
  Filled 2016-12-28: qty 2

## 2016-12-28 MED ORDER — CARVEDILOL 3.125 MG PO TABS
3.1250 mg | ORAL_TABLET | Freq: Two times a day (BID) | ORAL | Status: DC
  Administered 2016-12-28: 3.125 mg via ORAL

## 2016-12-28 MED ORDER — MENTHOL 3 MG MT LOZG: 1.0000 | LOZENGE | OROMUCOSAL | Status: DC | PRN

## 2016-12-28 MED ORDER — METOCLOPRAMIDE HCL 5 MG/ML IJ SOLN: 5.0000 mg | Freq: Three times a day (TID) | INTRAMUSCULAR | Status: DC | PRN

## 2016-12-28 MED ORDER — LIDOCAINE 2% (20 MG/ML) 5 ML SYRINGE
INTRAMUSCULAR | Status: AC
  Filled 2016-12-28: qty 5

## 2016-12-28 MED ORDER — FENTANYL CITRATE (PF) 100 MCG/2ML IJ SOLN
INTRAMUSCULAR | Status: DC | PRN
  Administered 2016-12-28: 100 ug via INTRAVENOUS

## 2016-12-28 MED ORDER — ACETAMINOPHEN 325 MG PO TABS
650.0000 mg | ORAL_TABLET | Freq: Four times a day (QID) | ORAL | Status: DC
Start: 2016-12-28 — End: 2016-12-29
  Administered 2016-12-28 – 2016-12-29 (×3): 650 mg via ORAL
  Filled 2016-12-28 (×3): qty 2

## 2016-12-28 MED ORDER — DEXAMETHASONE SODIUM PHOSPHATE 10 MG/ML IJ SOLN
INTRAMUSCULAR | Status: DC | PRN
  Administered 2016-12-28: 10 mg via INTRAVENOUS

## 2016-12-28 MED ORDER — DOCUSATE SODIUM 100 MG PO CAPS: 100.0000 mg | ORAL_CAPSULE | Freq: Two times a day (BID) | ORAL | 0 refills | Status: DC

## 2016-12-28 MED ORDER — VANCOMYCIN HCL IN DEXTROSE 1-5 GM/200ML-% IV SOLN
1000.0000 mg | INTRAVENOUS | Status: AC
  Administered 2016-12-28: 1000 mg via INTRAVENOUS

## 2016-12-28 MED ORDER — PHENOL 1.4 % MT LIQD: 1.0000 | OROMUCOSAL | Status: DC | PRN

## 2016-12-28 MED ORDER — LACTATED RINGERS IV SOLN: INTRAVENOUS | Status: DC

## 2016-12-28 MED ORDER — SODIUM CHLORIDE 0.9 % IV SOLN
INTRAVENOUS | Status: DC | PRN
  Administered 2016-12-28: 40 mL

## 2016-12-28 MED ORDER — MIDAZOLAM HCL 2 MG/2ML IJ SOLN
INTRAMUSCULAR | Status: AC
  Filled 2016-12-28: qty 2

## 2016-12-28 MED ORDER — DEXTROSE-NACL 5-0.45 % IV SOLN
INTRAVENOUS | Status: AC
  Administered 2016-12-28: 16:00:00 via INTRAVENOUS

## 2016-12-28 MED ORDER — GABAPENTIN 300 MG PO CAPS
ORAL_CAPSULE | ORAL | Status: AC
  Filled 2016-12-28: qty 1

## 2016-12-28 MED ORDER — SORBITOL 70 % SOLN: 30.0000 mL | Freq: Every day | Status: DC | PRN

## 2016-12-28 MED ORDER — ACETAMINOPHEN 500 MG PO TABS
1000.0000 mg | ORAL_TABLET | Freq: Once | ORAL | Status: AC
  Administered 2016-12-28: 1000 mg via ORAL

## 2016-12-28 MED ORDER — METHOCARBAMOL 1000 MG/10ML IJ SOLN: 500.0000 mg | Freq: Four times a day (QID) | INTRAVENOUS | Status: DC | PRN

## 2016-12-28 MED ORDER — BUPIVACAINE LIPOSOME 1.3 % IJ SUSP
INTRAMUSCULAR | Status: AC
Start: 2016-12-28 — End: 2016-12-28
  Filled 2016-12-28: qty 20

## 2016-12-28 MED ORDER — OXYCODONE-ACETAMINOPHEN 5-325 MG PO TABS
1.0000 | ORAL_TABLET | ORAL | 0 refills | Status: DC | PRN
Start: 2016-12-28 — End: 2018-09-11

## 2016-12-28 MED ORDER — VANCOMYCIN HCL IN DEXTROSE 1-5 GM/200ML-% IV SOLN
1000.0000 mg | Freq: Two times a day (BID) | INTRAVENOUS | Status: AC
  Administered 2016-12-29: 1000 mg via INTRAVENOUS

## 2016-12-28 MED ORDER — ACETAMINOPHEN 650 MG RE SUPP: 650.0000 mg | Freq: Four times a day (QID) | RECTAL | Status: DC | PRN

## 2016-12-28 MED ORDER — DEXAMETHASONE SODIUM PHOSPHATE 10 MG/ML IJ SOLN
INTRAMUSCULAR | Status: AC
  Filled 2016-12-28: qty 1

## 2016-12-28 MED ORDER — OXYCODONE HCL 5 MG PO TABS
5.0000 mg | ORAL_TABLET | ORAL | Status: DC | PRN
  Administered 2016-12-28 – 2016-12-29 (×3): 10 mg via ORAL
  Filled 2016-12-28 (×3): qty 2

## 2016-12-28 MED ORDER — ONDANSETRON HCL 4 MG PO TABS: 4.0000 mg | ORAL_TABLET | Freq: Four times a day (QID) | ORAL | Status: DC | PRN

## 2016-12-28 MED ORDER — METOCLOPRAMIDE HCL 5 MG PO TABS: 5.0000 mg | ORAL_TABLET | Freq: Three times a day (TID) | ORAL | Status: DC | PRN

## 2016-12-28 MED ORDER — RIVAROXABAN 10 MG PO TABS: 10.0000 mg | ORAL_TABLET | Freq: Every day | ORAL | 0 refills | Status: DC

## 2016-12-28 MED ORDER — LEVOTHYROXINE SODIUM 100 MCG PO TABS: 100.0000 ug | ORAL_TABLET | Freq: Every day | ORAL | Status: DC

## 2016-12-28 MED ORDER — ASPIRIN 81 MG PO TBEC: 81.0000 mg | DELAYED_RELEASE_TABLET | ORAL | 12 refills | Status: DC

## 2016-12-28 MED ORDER — FENTANYL CITRATE (PF) 100 MCG/2ML IJ SOLN
50.0000 ug | INTRAMUSCULAR | Status: DC | PRN
  Administered 2016-12-28: 75 ug via INTRAVENOUS

## 2016-12-28 MED ORDER — ACETAMINOPHEN 325 MG PO TABS: 650.0000 mg | ORAL_TABLET | Freq: Four times a day (QID) | ORAL | Status: DC | PRN

## 2016-12-28 MED ORDER — LACTATED RINGERS IV SOLN
INTRAVENOUS | Status: DC
  Administered 2016-12-28 (×2): via INTRAVENOUS

## 2016-12-28 MED ORDER — PANTOPRAZOLE SODIUM 40 MG PO TBEC
40.0000 mg | DELAYED_RELEASE_TABLET | Freq: Every day | ORAL | Status: DC
  Filled 2016-12-28: qty 1

## 2016-12-28 MED ORDER — SCOPOLAMINE 1 MG/3DAYS TD PT72: 1.0000 | MEDICATED_PATCH | Freq: Once | TRANSDERMAL | Status: DC | PRN

## 2016-12-28 MED ORDER — RIVAROXABAN 10 MG PO TABS: 10.0000 mg | ORAL_TABLET | Freq: Every day | ORAL | Status: DC

## 2016-12-28 MED ORDER — CHLORHEXIDINE GLUCONATE 4 % EX LIQD: 60.0000 mL | Freq: Once | CUTANEOUS | Status: DC

## 2016-12-28 MED ORDER — AMLODIPINE BESYLATE 5 MG PO TABS: 5.0000 mg | ORAL_TABLET | Freq: Every day | ORAL | Status: DC

## 2016-12-28 MED ORDER — ONDANSETRON HCL 4 MG PO TABS: 4.0000 mg | ORAL_TABLET | Freq: Three times a day (TID) | ORAL | 0 refills | Status: DC | PRN

## 2016-12-28 MED ORDER — DEXAMETHASONE SODIUM PHOSPHATE 10 MG/ML IJ SOLN: 10.0000 mg | Freq: Once | INTRAMUSCULAR | Status: DC

## 2016-12-28 MED ORDER — POLYETHYLENE GLYCOL 3350 17 G PO PACK: 17.0000 g | PACK | Freq: Every day | ORAL | Status: DC | PRN

## 2016-12-28 MED ORDER — GLYCOPYRROLATE 0.2 MG/ML IJ SOLN
INTRAMUSCULAR | Status: DC | PRN
  Administered 2016-12-28 (×2): 0.1 mg via INTRAVENOUS

## 2016-12-28 MED ORDER — EPHEDRINE SULFATE 50 MG/ML IJ SOLN
INTRAMUSCULAR | Status: DC | PRN
  Administered 2016-12-28: 25 mg via INTRAVENOUS

## 2016-12-28 MED ORDER — BACLOFEN 10 MG PO TABS: 10.0000 mg | ORAL_TABLET | Freq: Three times a day (TID) | ORAL | 0 refills | Status: DC | PRN

## 2016-12-28 MED ORDER — DIPHENHYDRAMINE HCL 12.5 MG/5ML PO ELIX: 12.5000 mg | ORAL_SOLUTION | ORAL | Status: DC | PRN

## 2016-12-28 MED ORDER — EPHEDRINE 5 MG/ML INJ
INTRAVENOUS | Status: AC
  Filled 2016-12-28: qty 10

## 2016-12-28 MED ORDER — MIDAZOLAM HCL 2 MG/2ML IJ SOLN
1.0000 mg | INTRAMUSCULAR | Status: DC | PRN
  Administered 2016-12-28: 2 mg via INTRAVENOUS

## 2016-12-28 MED ORDER — GABAPENTIN 300 MG PO CAPS
300.0000 mg | ORAL_CAPSULE | Freq: Once | ORAL | Status: AC
  Administered 2016-12-28: 300 mg via ORAL

## 2016-12-28 MED ORDER — PROPOFOL 10 MG/ML IV BOLUS
INTRAVENOUS | Status: DC | PRN
  Administered 2016-12-28: 150 mg via INTRAVENOUS

## 2016-12-28 MED ORDER — METHOCARBAMOL 500 MG PO TABS
500.0000 mg | ORAL_TABLET | Freq: Four times a day (QID) | ORAL | Status: DC | PRN
  Administered 2016-12-28: 500 mg via ORAL
  Filled 2016-12-28: qty 1

## 2016-12-28 MED ORDER — DOCUSATE SODIUM 100 MG PO CAPS
100.0000 mg | ORAL_CAPSULE | Freq: Two times a day (BID) | ORAL | Status: DC
  Administered 2016-12-28: 100 mg via ORAL
  Filled 2016-12-28: qty 1

## 2016-12-28 MED ORDER — SENNA 8.6 MG PO TABS
1.0000 | ORAL_TABLET | Freq: Two times a day (BID) | ORAL | Status: DC
  Administered 2016-12-28: 8.6 mg via ORAL
  Filled 2016-12-28: qty 1

## 2016-12-28 SURGICAL SUPPLY — 78 items
ADH SKN CLS APL DERMABOND .7 (GAUZE/BANDAGES/DRESSINGS)
APL SKNCLS STERI-STRIP NONHPOA (GAUZE/BANDAGES/DRESSINGS) ×1
BANDAGE ACE 4X5 VEL STRL LF (GAUZE/BANDAGES/DRESSINGS) ×3 IMPLANT
BANDAGE ACE 6X5 VEL STRL LF (GAUZE/BANDAGES/DRESSINGS) ×3 IMPLANT
BANDAGE ESMARK 6X9 LF (GAUZE/BANDAGES/DRESSINGS) ×1 IMPLANT
BEARING MENISCAL TIBIAL 5 SM R (Orthopedic Implant) ×2 IMPLANT
BEARING TIB MENISCAL RT SM 4MM (Joint) IMPLANT
BENZOIN TINCTURE PRP APPL 2/3 (GAUZE/BANDAGES/DRESSINGS) ×3 IMPLANT
BLADE SURG 10 STRL SS (BLADE) ×3 IMPLANT
BLADE SURG 15 STRL LF DISP TIS (BLADE) ×1 IMPLANT
BLADE SURG 15 STRL SS (BLADE) ×6
BNDG CMPR 9X6 STRL LF SNTH (GAUZE/BANDAGES/DRESSINGS) ×1
BNDG ESMARK 6X9 LF (GAUZE/BANDAGES/DRESSINGS) ×3
BONE CEMENT PALACOSE (Cement) ×3 IMPLANT
BOWL SMART MIX CTS (DISPOSABLE) ×3 IMPLANT
BRNG TIB SM 4 PHS 3 RT MEN (Joint) ×1 IMPLANT
BRNG TIB SM 5 PHS 3 RT MEN (Orthopedic Implant) ×1 IMPLANT
CANISTER SUCT 1200ML W/VALVE (MISCELLANEOUS) ×1 IMPLANT
CAP KNEE PARTIAL 2 IMPLANT
CAPT KNEE PARTIAL 2 ×3 IMPLANT
CEMENT BONE PALACOSE (Cement) ×1 IMPLANT
CHLORAPREP W/TINT 26ML (MISCELLANEOUS) ×3 IMPLANT
CLOSURE STERI-STRIP 1/2X4 (GAUZE/BANDAGES/DRESSINGS) ×1
CLSR STERI-STRIP ANTIMIC 1/2X4 (GAUZE/BANDAGES/DRESSINGS) ×2 IMPLANT
COVER BACK TABLE 60X90IN (DRAPES) ×3 IMPLANT
CUFF TOURNIQUET SINGLE 34IN LL (TOURNIQUET CUFF) ×2 IMPLANT
DERMABOND ADVANCED (GAUZE/BANDAGES/DRESSINGS)
DERMABOND ADVANCED .7 DNX12 (GAUZE/BANDAGES/DRESSINGS) ×1 IMPLANT
DRAPE ARTHROSCOPY W/POUCH 114 (DRAPES) ×3 IMPLANT
DRAPE IMP U-DRAPE 54X76 (DRAPES) ×3 IMPLANT
DRAPE INCISE IOBAN 66X45 STRL (DRAPES) ×3 IMPLANT
DRAPE U-SHAPE 47X51 STRL (DRAPES) ×3 IMPLANT
DRSG MEPILEX BORDER 4X8 (GAUZE/BANDAGES/DRESSINGS) ×5 IMPLANT
ELECT REM PT RETURN 9FT ADLT (ELECTROSURGICAL) ×3
ELECTRODE REM PT RTRN 9FT ADLT (ELECTROSURGICAL) ×1 IMPLANT
GAUZE SPONGE 4X4 12PLY STRL (GAUZE/BANDAGES/DRESSINGS) ×3 IMPLANT
GLOVE BIO SURGEON STRL SZ7.5 (GLOVE) ×6 IMPLANT
GLOVE BIOGEL PI IND STRL 7.0 (GLOVE) IMPLANT
GLOVE BIOGEL PI IND STRL 8 (GLOVE) ×2 IMPLANT
GLOVE BIOGEL PI INDICATOR 7.0 (GLOVE) ×4
GLOVE BIOGEL PI INDICATOR 8 (GLOVE) ×4
GLOVE ECLIPSE 6.5 STRL STRAW (GLOVE) ×4 IMPLANT
GOWN STRL REUS W/ TWL LRG LVL3 (GOWN DISPOSABLE) ×1 IMPLANT
GOWN STRL REUS W/ TWL XL LVL3 (GOWN DISPOSABLE) ×2 IMPLANT
GOWN STRL REUS W/TWL LRG LVL3 (GOWN DISPOSABLE) ×3
GOWN STRL REUS W/TWL XL LVL3 (GOWN DISPOSABLE) ×6
HANDPIECE INTERPULSE COAX TIP (DISPOSABLE) ×3
IMMOBILIZER KNEE 22 UNIV (SOFTGOODS) ×2 IMPLANT
IMMOBILIZER KNEE 24 THIGH 36 (MISCELLANEOUS) ×1 IMPLANT
IMMOBILIZER KNEE 24 UNIV (MISCELLANEOUS)
KNEE WRAP E Z 3 GEL PACK (MISCELLANEOUS) ×3 IMPLANT
MANIFOLD NEPTUNE II (INSTRUMENTS) ×3 IMPLANT
NS IRRIG 1000ML POUR BTL (IV SOLUTION) ×3 IMPLANT
PACK ARTHROSCOPY DSU (CUSTOM PROCEDURE TRAY) ×3 IMPLANT
PACK BASIN DAY SURGERY FS (CUSTOM PROCEDURE TRAY) ×3 IMPLANT
PACK BLADE SAW RECIP 70 3 PT (BLADE) ×3 IMPLANT
PADDING CAST COTTON 6X4 STRL (CAST SUPPLIES) ×3 IMPLANT
PENCIL BUTTON HOLSTER BLD 10FT (ELECTRODE) ×3 IMPLANT
SET HNDPC FAN SPRY TIP SCT (DISPOSABLE) ×1 IMPLANT
SLEEVE SCD COMPRESS KNEE MED (MISCELLANEOUS) ×3 IMPLANT
SPONGE LAP 18X18 X RAY DECT (DISPOSABLE) ×3 IMPLANT
SUCTION FRAZIER HANDLE 10FR (MISCELLANEOUS) ×2
SUCTION TUBE FRAZIER 10FR DISP (MISCELLANEOUS) ×1 IMPLANT
SUT MNCRL AB 4-0 PS2 18 (SUTURE) ×3 IMPLANT
SUT MON AB 2-0 CT1 36 (SUTURE) ×3 IMPLANT
SUT VIC AB 0 CT1 27 (SUTURE) ×3
SUT VIC AB 0 CT1 27XBRD ANBCTR (SUTURE) ×1 IMPLANT
SUT VIC AB 1 CT1 27 (SUTURE) ×6
SUT VIC AB 1 CT1 27XBRD ANBCTR (SUTURE) ×1 IMPLANT
SUT VIC AB 2-0 SH 27 (SUTURE)
SUT VIC AB 2-0 SH 27XBRD (SUTURE) IMPLANT
SYR BULB IRRIGATION 50ML (SYRINGE) ×3 IMPLANT
TOWEL OR 17X24 6PK STRL BLUE (TOWEL DISPOSABLE) ×3 IMPLANT
TOWEL OR NON WOVEN STRL DISP B (DISPOSABLE) ×6 IMPLANT
TUBE CONNECTING 20'X1/4 (TUBING)
TUBE CONNECTING 20X1/4 (TUBING) IMPLANT
UNICOMPARTMENTAL KNEE MENISCAL (Joint) ×3 IMPLANT
YANKAUER SUCT BULB TIP NO VENT (SUCTIONS) ×3 IMPLANT

## 2016-12-28 NOTE — Progress Notes (Signed)
AssistedDr. Edwards with right, ultrasound guided, adductor canal block. Side rails up, monitors on throughout procedure. See vital signs in flow sheet. Tolerated Procedure well.  

## 2016-12-28 NOTE — Anesthesia Postprocedure Evaluation (Signed)
Anesthesia Post Note  Patient: Julie Jefferson  Procedure(s) Performed: Procedure(s) (LRB): RIGHT UNICOMPARTMENTAL KNEE (Right)  Patient location during evaluation: PACU Anesthesia Type: General Level of consciousness: awake Pain management: pain level controlled Vital Signs Assessment: post-procedure vital signs reviewed and stable Respiratory status: spontaneous breathing Cardiovascular status: stable Anesthetic complications: no       Last Vitals:  Vitals:   12/28/16 1445 12/28/16 1500  BP:  (!) 159/88  Pulse:  68  Resp:  17  Temp: 36.5 C     Last Pain:  Vitals:   12/28/16 1500  TempSrc:   PainSc: Asleep        RLE Motor Response: Purposeful movement (12/28/16 1500) RLE Sensation: Decreased (Blocked) (12/28/16 1500)      Javan Gonzaga

## 2016-12-28 NOTE — Transfer of Care (Signed)
Immediate Anesthesia Transfer of Care Note  Patient: Julie Jefferson  Procedure(s) Performed: Procedure(s): RIGHT UNICOMPARTMENTAL KNEE (Right)  Patient Location: PACU  Anesthesia Type:GA combined with regional for post-op pain  Level of Consciousness: awake and patient cooperative  Airway & Oxygen Therapy: Patient Spontanous Breathing and Patient connected to face mask oxygen  Post-op Assessment: Report given to RN and Post -op Vital signs reviewed and stable  Post vital signs: Reviewed and stable  Last Vitals:  Vitals:   12/28/16 1152 12/28/16 1445  BP: 117/64   Pulse: (!) 55   Resp: 11   Temp:  36.5 C    Last Pain:  Vitals:   12/28/16 1445  TempSrc:   PainSc: 5          Complications: No apparent anesthesia complications

## 2016-12-28 NOTE — Discharge Instructions (Signed)

## 2016-12-28 NOTE — Interval H&P Note (Signed)
History and Physical Interval Note:  12/28/2016 12:37 PM  Julie Jefferson  has presented today for surgery, with the diagnosis of RIGHT KNEE OA  The various methods of treatment have been discussed with the patient and family. After consideration of risks, benefits and other options for treatment, the patient has consented to  Procedure(s): RIGHT UNICOMPARTMENTAL KNEE (Right) as a surgical intervention .  The patient's history has been reviewed, patient examined, no change in status, stable for surgery.  I have reviewed the patient's chart and labs.  Questions were answered to the patient's satisfaction.     Lakendria Nicastro D

## 2016-12-28 NOTE — Anesthesia Procedure Notes (Signed)
Procedure Name: LMA Insertion Date/Time: 12/28/2016 12:47 PM Performed by: Toula Moos L Pre-anesthesia Checklist: Patient identified, Emergency Drugs available, Suction available, Patient being monitored and Timeout performed Patient Re-evaluated:Patient Re-evaluated prior to inductionOxygen Delivery Method: Circle system utilized Preoxygenation: Pre-oxygenation with 100% oxygen Intubation Type: IV induction Ventilation: Mask ventilation without difficulty LMA: LMA inserted LMA Size: 4.0 Number of attempts: 1 Airway Equipment and Method: Bite block Placement Confirmation: positive ETCO2 Tube secured with: Tape Dental Injury: Teeth and Oropharynx as per pre-operative assessment

## 2016-12-28 NOTE — Anesthesia Procedure Notes (Signed)
Anesthesia Regional Block:  Adductor canal block  Pre-Anesthetic Checklist: ,, timeout performed, Correct Patient, Correct Site, Correct Laterality, Correct Procedure, Correct Position, site marked, Risks and benefits discussed,  Surgical consent,  Pre-op evaluation,  At surgeon's request and post-op pain management Needles:   Needle Type: Stimulator Needle - 80          Additional Needles:  Procedures: Doppler guided and ultrasound guided (picture in chart) Adductor canal block Narrative:  Start time: 12/28/2016 11:30 AM End time: 12/28/2016 11:45 AM Injection made incrementally with aspirations every 5 mL. Anesthesiologist: Shanterria Franta

## 2016-12-28 NOTE — Op Note (Signed)
12/28/2016  2:37 PM  PATIENT:  Julie Jefferson    PRE-OPERATIVE DIAGNOSIS:  RIGHT KNEE OA  POST-OPERATIVE DIAGNOSIS:  Same  PROCEDURE:  RIGHT UNICOMPARTMENTAL KNEE  SURGEON:  Johntae Broxterman D, MD  PHYSICIAN ASSISTANT: Roxan Hockey, PA-C, he was present and scrubbed throughout the case, critical for completion in a timely fashion, and for retraction, instrumentation, and closure.   ANESTHESIA:   General  PREOPERATIVE INDICATIONS:  Julie Jefferson is a  71 y.o. female with a diagnosis of RIGHT KNEE OA who failed conservative measures and elected for surgical management.    The risks benefits and alternatives were discussed with the patient preoperatively including but not limited to the risks of infection, bleeding, nerve injury, cardiopulmonary complications, blood clots, the need for revision surgery, among others, and the patient was willing to proceed.  OPERATIVE IMPLANTS: Biomet Oxford mobile bearing medial compartment arthroplasty. Femoral Component: small. Tibial tray: C, Size 5 poly.   OPERATIVE FINDINGS: Endstage grade 4 medial compartment osteoarthritis. No significant changes in the lateral or patellofemoral joint  OPERATIVE PROCEDURE: The patient was brought to the operating room placed in supine position. General anesthesia was administered. IV antibiotics were given. The lower extremity was placed in the legholder and prepped and draped in usual sterile fashion.  Time out was performed.  The leg was elevated and exsanguinated and the tourniquet was inflated. Anteromedial incision was performed, and I took care to preserve the MCL. Parapatellar incision was carried out, and the osteophytes were excised, along with the medial meniscus and a small portion of the fat pad.  The extra medullary tibial cutting jig was applied, using the spoon and the 36mm G-Clamp, and I took care to protect the anterior cruciate ligament insertion and the tibial spine. The medial collateral  ligament was also protected, and I resected my proximal tibia, matching the anatomic slope.   The proximal tibial bony cut was removed in one piece, and I turned my attention to the femur.  The intramedullary femoral rod was placed using the drill, and then using the appropriate reference, I assembled the femoral jig, setting my posterior cutting block. I resected my posterior femur, and then measured my gap.   I then used the mill to match the extension gap to the flexion gap. The gaps were then measured again with the appropriate feeler gauges. Once I had balanced flexion and extension gaps, I then completed the preparation of the femur.  I milled off the anterior aspect of the distal femur to prevent impingement. I also exposed the tibia, and selected the above-named component, and then used the cutting jig to prepare the keel slot on the tibia. I also used the awl to curette out the bone to complete the preparation of the keel. The back wall was intact.  I then placed trial components, and it was found to have excellent motion, and appropriate balance.  I then cemented the components into place, cementing the tibia first, removing all excess cement, and then cementing the femur.  All loose cement was removed.  The real polyethylene insert was applied manually, and the knee was taken through functional range of motion, and found to have excellent stability and restoration of joint motion, with excellent balance.  The wounds were irrigated copiously, and the parapatellar tissue closed with Vicryl, followed by Vicryl for the subcutaneous tissue, with routine closure with Steri-Strips and sterile gauze.  The tourniquet was released, and the patient was awakened and extubated and returned to PACU  in stable and satisfactory condition. There were no complications.  POSTOPERATIVE PLAN: DVT px will consist of SCD's and chemical px. , WBAT     Renette Butters, MD

## 2016-12-28 NOTE — Anesthesia Preprocedure Evaluation (Signed)
Anesthesia Evaluation  Patient identified by MRN, date of birth, ID band Patient awake    Reviewed: Allergy & Precautions, NPO status , Patient's Chart, lab work & pertinent test results  Airway Mallampati: II  TM Distance: >3 FB     Dental   Pulmonary neg pulmonary ROS,    breath sounds clear to auscultation       Cardiovascular hypertension,  Rhythm:Regular Rate:Normal     Neuro/Psych  Neuromuscular disease    GI/Hepatic Neg liver ROS, GERD  ,  Endo/Other  Hypothyroidism   Renal/GU negative Renal ROS     Musculoskeletal  (+) Arthritis ,   Abdominal   Peds  Hematology   Anesthesia Other Findings   Reproductive/Obstetrics                             Anesthesia Physical Anesthesia Plan  ASA: III  Anesthesia Plan: General   Post-op Pain Management:    Induction: Intravenous  Airway Management Planned: LMA  Additional Equipment:   Intra-op Plan:   Post-operative Plan: Extubation in OR  Informed Consent: I have reviewed the patients History and Physical, chart, labs and discussed the procedure including the risks, benefits and alternatives for the proposed anesthesia with the patient or authorized representative who has indicated his/her understanding and acceptance.   Dental advisory given  Plan Discussed with: CRNA and Anesthesiologist  Anesthesia Plan Comments:         Anesthesia Quick Evaluation

## 2016-12-29 DIAGNOSIS — M1711 Unilateral primary osteoarthritis, right knee: Secondary | ICD-10-CM | POA: Diagnosis not present

## 2016-12-31 ENCOUNTER — Encounter (HOSPITAL_BASED_OUTPATIENT_CLINIC_OR_DEPARTMENT_OTHER): Payer: Self-pay | Admitting: Orthopedic Surgery

## 2017-01-19 ENCOUNTER — Other Ambulatory Visit: Payer: Self-pay | Admitting: Cardiovascular Disease

## 2017-01-29 ENCOUNTER — Encounter (HOSPITAL_BASED_OUTPATIENT_CLINIC_OR_DEPARTMENT_OTHER): Payer: Self-pay | Admitting: Orthopedic Surgery

## 2017-04-05 ENCOUNTER — Other Ambulatory Visit: Payer: Self-pay | Admitting: Cardiovascular Disease

## 2017-06-13 ENCOUNTER — Other Ambulatory Visit: Payer: Self-pay | Admitting: Internal Medicine

## 2017-06-13 DIAGNOSIS — Z1231 Encounter for screening mammogram for malignant neoplasm of breast: Secondary | ICD-10-CM

## 2017-06-25 ENCOUNTER — Ambulatory Visit: Payer: Medicare Other

## 2017-06-28 ENCOUNTER — Ambulatory Visit
Admission: RE | Admit: 2017-06-28 | Discharge: 2017-06-28 | Disposition: A | Discharge status: Home / Self Care | Payer: Medicare Other | Source: Ambulatory Visit | Attending: Internal Medicine | Admitting: Internal Medicine

## 2017-06-28 DIAGNOSIS — Z1231 Encounter for screening mammogram for malignant neoplasm of breast: Secondary | ICD-10-CM

## 2017-06-28 HISTORY — DX: Personal history of irradiation: Z92.3

## 2017-06-28 HISTORY — DX: Malignant neoplasm of unspecified site of unspecified female breast: C50.919

## 2017-06-28 HISTORY — DX: Personal history of antineoplastic chemotherapy: Z92.21

## 2017-07-15 ENCOUNTER — Other Ambulatory Visit: Payer: Self-pay | Admitting: Cardiovascular Disease

## 2017-12-20 ENCOUNTER — Other Ambulatory Visit (HOSPITAL_COMMUNITY): Payer: Self-pay | Admitting: Internal Medicine

## 2017-12-20 ENCOUNTER — Ambulatory Visit (HOSPITAL_COMMUNITY)
Admission: RE | Admit: 2017-12-20 | Discharge: 2017-12-20 | Disposition: A | Discharge status: Home / Self Care | Payer: Medicare Other | Source: Ambulatory Visit | Attending: Cardiology | Admitting: Cardiology

## 2017-12-20 DIAGNOSIS — M7989 Other specified soft tissue disorders: Principal | ICD-10-CM

## 2017-12-20 DIAGNOSIS — M79661 Pain in right lower leg: Secondary | ICD-10-CM | POA: Insufficient documentation

## 2018-01-22 ENCOUNTER — Other Ambulatory Visit: Payer: Self-pay | Admitting: Cardiovascular Disease

## 2018-02-17 ENCOUNTER — Other Ambulatory Visit: Payer: Self-pay | Admitting: *Deleted

## 2018-02-17 MED ORDER — CARVEDILOL 3.125 MG PO TABS: 3.1250 mg | ORAL_TABLET | Freq: Two times a day (BID) | ORAL | 0 refills | Status: DC

## 2018-02-25 ENCOUNTER — Other Ambulatory Visit: Payer: Self-pay | Admitting: *Deleted

## 2018-02-25 MED ORDER — CARVEDILOL 3.125 MG PO TABS: 3.1250 mg | ORAL_TABLET | Freq: Two times a day (BID) | ORAL | 0 refills | Status: DC

## 2018-03-29 ENCOUNTER — Other Ambulatory Visit: Payer: Self-pay | Admitting: Cardiovascular Disease

## 2018-04-07 ENCOUNTER — Other Ambulatory Visit: Payer: Self-pay | Admitting: Cardiovascular Disease

## 2018-05-05 ENCOUNTER — Other Ambulatory Visit: Payer: Self-pay | Admitting: Cardiovascular Disease

## 2018-05-18 ENCOUNTER — Other Ambulatory Visit: Payer: Self-pay | Admitting: Cardiovascular Disease

## 2018-07-08 ENCOUNTER — Ambulatory Visit: Payer: Medicare Other | Admitting: Cardiovascular Disease

## 2018-08-17 ENCOUNTER — Other Ambulatory Visit: Payer: Self-pay | Admitting: Cardiovascular Disease

## 2018-09-01 ENCOUNTER — Encounter (HOSPITAL_COMMUNITY): Payer: Self-pay | Admitting: *Deleted

## 2018-09-10 ENCOUNTER — Encounter: Payer: Self-pay | Admitting: *Deleted

## 2018-09-10 DIAGNOSIS — M1712 Unilateral primary osteoarthritis, left knee: Secondary | ICD-10-CM | POA: Diagnosis present

## 2018-09-10 NOTE — H&P (Signed)
KNEE ARTHROPLASTY ADMISSION H&P  Patient ID: Julie Jefferson MRN: [id number] DOB/AGE: 72-Oct-1947 72 y.o.  Chief Complaint: left knee pain.  Planned Procedure Date: 09/23/18 Medical Clearance by Dr. Bobby Rumpf   Cardiac Clearance by Dr. Bronson Ing pending appt. 09/11/18.  HPI: Julie Jefferson is a 72 y.o. female with a history of HTN, HLD, Takotsubo cardiomyopathy, hypothyroidism, and GERD who presents for evaluation of OA LEFT KNEE. The patient has a history of pain and functional disability in the left knee due to arthritis and has failed non-surgical conservative treatments for greater than 12 weeks to include NSAID's and/or analgesics, corticosteriod injections and activity modification.  Onset of symptoms was gradual, starting 2 years ago with gradually worsening course since that time.   Patient currently rates pain at 8 out of 10 with activity. Patient has night pain, worsening of pain with activity and weight bearing and pain that interferes with activities of daily living.  Patient has evidence of periarticular osteophytes and joint space narrowing by imaging studies.  There is no active infection.  Past Medical History:  Diagnosis Date  . Arthritis    rt knee, back, hands  . Breast cancer ([place]) 2005   left  . Chest pain    February, 2014  . Cough    ACE cough 2012  . Ejection fraction    EF 60-65%, echo, February, 2009, (LV function normalized).  . GERD (gastroesophageal reflux disease)   . History of breast cancer    left  . HLD (hyperlipidemia)    mixed  . HTN (hypertension)   . Hypothyroidism   . Personal history of chemotherapy 2005  . Personal history of radiation therapy 2005  . Pre-syncope    Hospital, June, 2014, probably dehydration  . Takotsubo cardiomyopathy   . Takotsubo syndrome    cardiac cath 11/08. minimal coronary disease, anteroseptal akinesis with an EF 55%.  echo 2/09 EF 60-65% (LV function normalized)   Past Surgical History:  Procedure Laterality  Date  . BREAST BIOPSY  01/03/2012  . BREAST LUMPECTOMY Left 2005   left breast  . BUNIONECTOMY     left  . cholecystectomy    . GANGLION CYST EXCISION     left  . PARTIAL KNEE ARTHROPLASTY Right 12/28/2016   Procedure: RIGHT UNICOMPARTMENTAL KNEE;  Surgeon: Renette Butters, MD;  Location: [place];  Service: Orthopedics;  Laterality: Right;  . TOTAL ABDOMINAL HYSTERECTOMY  1997   Allergies  Allergen Reactions  . Alphagan [Brimonidine] Other (See Comments)    Dries eyes out.   . Dorzolamide Hcl Other (See Comments)    Dried eyes out.  Marland Kitchen Penicillins Other (See Comments)    Has patient had a PCN reaction causing immediate rash, facial/tongue/throat swelling, SOB or lightheadedness with hypotension: Unknown childhood reaction Has patient had a PCN reaction causing severe rash involving mucus membranes or skin necrosis: No Has patient had a PCN reaction that required hospitalization No Has patient had a PCN reaction occurring within the last 10 years: No If all of the above answers are "NO", then may proceed with Cephalosporin use.    Prior to Admission medications   Medication Sig Start Date End Date Taking? Authorizing Provider  acetaminophen (TYLENOL) 650 MG CR tablet Take 650 mg by mouth at bedtime.     [provider]  amLODipine (NORVASC) 5 MG tablet Take 5 mg by mouth Daily.  10/03/11   [provider]  aspirin 81 MG EC tablet Take 1 tablet (81  mg total) by mouth every morning. Restart after 30 days when Xarelto has been completed. 12/28/16   Martensen, Charna Elizabeth III, PA-C  baclofen (LIORESAL) 10 MG tablet Take 1 tablet (10 mg total) by mouth 3 (three) times daily as needed for muscle spasms. 12/28/16   Martensen, Charna Elizabeth III, PA-C  bimatoprost (LUMIGAN) 0.03 % ophthalmic solution Place 1 drop into both eyes at bedtime.     [provider]  carvedilol (COREG) 3.125 MG tablet TAKE 1 TABLET BY MOUTH TWICE A DAY 03/31/18   Herminio Commons, MD  carvedilol (COREG) 3.125 MG tablet TAKE 1 TABLET BY MOUTH TWICE A DAY Patient taking differently: Take 3.125 mg by mouth 2 (two) times daily with a meal.  08/18/18   Herminio Commons, MD  docusate sodium (COLACE) 100 MG capsule Take 1 capsule (100 mg total) by mouth 2 (two) times daily. To prevent constipation while taking pain medication. 12/28/16   Prudencio Burly III, PA-C  levothyroxine (SYNTHROID, LEVOTHROID) 100 MCG tablet Take 100 mcg by mouth daily.      [provider]  meclizine (ANTIVERT) 25 MG tablet Take 25 mg by mouth 3 (three) times daily as needed for dizziness.     [provider]  omeprazole (PRILOSEC) 40 MG capsule Take 40 mg by mouth daily.      [provider]  ondansetron (ZOFRAN) 4 MG tablet Take 1 tablet (4 mg total) by mouth every 8 (eight) hours as needed for nausea or vomiting. 12/28/16   Prudencio Burly III, PA-C  oxyCODONE-acetaminophen (ROXICET) 5-325 MG tablet Take 1-2 tablets by mouth every 4 (four) hours as needed for severe pain. 12/28/16   Prudencio Burly III, PA-C  polyethylene glycol powder (GLYCOLAX/MIRALAX) powder Take 17 g by mouth as needed for mild constipation.     [provider]  rivaroxaban (XARELTO) 10 MG TABS tablet Take 1 tablet (10 mg total) by mouth daily. For 30 days for DVT prophylaxis 12/28/16   Prudencio Burly III, PA-C  simvastatin (ZOCOR) 10 MG tablet TAKE 1 TABLET BY MOUTH EVERYDAY AT BEDTIME Patient taking differently: Take 10 mg by mouth at bedtime.  05/05/18   Herminio Commons, MD  timolol (BETIMOL) 0.5 % ophthalmic solution Place 1 drop into both eyes every morning.    [provider]   Social History   Socioeconomic History  . Marital status: Widowed    Spouse name: Not on file  . Number of children: Not on file  . Years of education: Not on file  . Highest education level: Not on file  Occupational History  . Not on file  Social Needs  .  Financial resource strain: Not on file  . Food insecurity:    Worry: Not on file    Inability: Not on file  . Transportation needs:    Medical: Not on file    Non-medical: Not on file  Tobacco Use  . Smoking status: Never Smoker  . Smokeless tobacco: Never Used  . Tobacco comment: tobacco use - no   Substance and Sexual Activity  . Alcohol use: No    Alcohol/week: 0.0 standard drinks  . Drug use: No  . Sexual activity: Not on file  Lifestyle  . Physical activity:    Days per week: Not on file    Minutes per session: Not on file  . Stress: Not on file  Relationships  . Social connections:    Talks on phone: Not on  file    Gets together: Not on file    Attends religious service: Not on file    Active member of club or organization: Not on file    Attends meetings of clubs or organizations: Not on file    Relationship status: Not on file  Other Topics Concern  . Not on file  Social History Narrative   Widowed, works full time, gets regular exercise.    Family History  Problem Relation Age of Onset  . Cancer Mother 47       breast  . Breast cancer Mother   . Heart disease Father   . Coronary artery disease Unknown        family hx  . Cancer Sister        LCIS 71  . Breast cancer Sister   . Cancer Maternal Aunt 75       breast  . Breast cancer Maternal Aunt     ROS: Currently denies lightheadedness, dizziness, Fever, chills, CP, SOB.   No personal history of DVT, PE, or CVA. No loose teeth or dentures All other systems have been reviewed and were otherwise currently negative with the exception of those mentioned in the HPI and as above.  Objective: Vitals: Ht: 5 feet 5 inches wt: 194 temp: 97.8 BP: 143/77 pulse: 62 O2 99% on room air.   Physical Exam: General: Alert, NAD.  Antalgic Gait  HEENT: EOMI, Good Neck Extension  Pulm: No increased work of breathing.  Clear B/L A/P w/o crackle or wheeze.  CV: RRR, No m/g/r appreciated  GI: soft, NT, ND Neuro: Neuro  without gross focal deficit.  Sensation intact distally Skin: No lesions in the area of chief complaint MSK/Surgical Site: Left knee w/o redness or effusion.  Medial JLT. ROM 0-115.  5/5 strength in extension and flexion.  +EHL/FHL.  NVI.  Stable varus and valgus stress.    Imaging Review Plain radiographs demonstrate severe degenerative joint disease of the left knee.   Preoperative templating of the joint replacement has been completed, documented, and submitted to the Operating Room personnel in order to optimize intra-operative equipment management.  Assessment: OA LEFT KNEE Principal Problem:   Primary osteoarthritis of left knee Active Problems:   Takotsubo syndrome   HTN (hypertension)   HLD (hyperlipidemia)   Hypothyroidism   GERD (gastroesophageal reflux disease)   Plan: Plan for Procedure(s): UNICOMPARTMENTAL LEFT  KNEE  The patient history, physical exam, clinical judgement of the provider and imaging are consistent with end stage degenerative joint disease and unicompartmental joint arthroplasty is deemed medically necessary. The treatment options including medical management, injection therapy, and arthroplasty were discussed at length. The risks and benefits of Procedure(s): UNICOMPARTMENTAL LEFT  KNEE were presented and reviewed.  The risks of nonoperative treatment, versus surgical intervention including but not limited to continued pain, aseptic loosening, stiffness, dislocation/subluxation, infection, bleeding, nerve injury, blood clots, cardiopulmonary complications, morbidity, mortality, among others were discussed. The patient verbalizes understanding and wishes to proceed with the plan.  Patient is being admitted for inpatient treatment for surgery, pain control, PT, OT, prophylactic antibiotics, VTE prophylaxis, progressive ambulation, ADL's and discharge planning.   Dental prophylaxis discussed and recommended for 2 years postoperatively.   The patient does  meet the criteria for TXA which will be used perioperatively.    ASA 81 mg BID will be used postoperatively for DVT prophylaxis in addition to SCDs, and early ambulation.  Plan for Tylenol, gabapentin, oxycodone for pain.  No p.o. NSAIDs due  to cardiovascular history.  The patient is planning to be discharged home with OPPT in care of her daughter who is a Marine scientist.    Patient's anticipated LOS is less than 2 midnights, meeting these requirements: - Lives within 1 hour of care - Has a competent adult at home to recover with post-op recover - NO history of  - Chronic pain requiring opiods  - Diabetes  - Coronary Artery Disease  - Heart failure  - Stroke  - DVT/VTE  - Cardiac arrhythmia  - Respiratory Failure/COPD  - Renal failure  - Anemia  - Advanced Liver disease   Prudencio Burly III, PA-C 09/10/2018 4:07 PM

## 2018-09-11 ENCOUNTER — Ambulatory Visit (INDEPENDENT_AMBULATORY_CARE_PROVIDER_SITE_OTHER): Payer: Medicare Other | Admitting: Cardiovascular Disease

## 2018-09-11 ENCOUNTER — Encounter: Payer: Self-pay | Admitting: *Deleted

## 2018-09-11 ENCOUNTER — Encounter

## 2018-09-11 VITALS — BP 148/80 | HR 66 | Ht 65.0 in | Wt 194.0 lb

## 2018-09-11 DIAGNOSIS — I10 Essential (primary) hypertension: Secondary | ICD-10-CM

## 2018-09-11 DIAGNOSIS — Z23 Encounter for immunization: Secondary | ICD-10-CM | POA: Diagnosis not present

## 2018-09-11 DIAGNOSIS — E7800 Pure hypercholesterolemia, unspecified: Secondary | ICD-10-CM

## 2018-09-11 DIAGNOSIS — I5181 Takotsubo syndrome: Secondary | ICD-10-CM

## 2018-09-11 NOTE — Progress Notes (Signed)
SUBJECTIVE: The patientpresents for routine follow-up. She has a history of syncope and Takotsubo cardiomyopathy in 2008. Her daughter is [name], corporate), who now works in the [employer].  She had a presyncopal episode in 2014 felt secondary to volume depletion. Coronary angiography in November 2008 showed minimal coronary artery disease.  Most recent echocardiogram on 05/12/13 demonstrated normal left ventricular systolic and diastolic function, EF 63-14%, and normal regional wall motion.  ECG performed in the office today which I ordered and personally interpreted demonstrates normal sinus rhythm with no ischemic ST segment or T-wave abnormalities, nor any arrhythmias.  The patient denies any symptoms of chest pain, palpitations, shortness of breath, lightheadedness, dizziness, leg swelling, orthopnea, PND, and syncope.  She knows to keep herself adequately hydrated.  She is planning to undergo left knee replacement surgery on 09/23/2018.  Her blood pressure is checked at home and in her PCPs office and it is routinely normal.  It is mildly elevated in our office today, 148/80.   Review of Systems: As per "subjective", otherwise negative.  Allergies  Allergen Reactions  . Alphagan [Brimonidine] Other (See Comments)    Dries eyes out.   . Dorzolamide Hcl Other (See Comments)    Dried eyes out.  Marland Kitchen Penicillins Other (See Comments)    Has patient had a PCN reaction causing immediate rash, facial/tongue/throat swelling, SOB or lightheadedness with hypotension: Unknown childhood reaction Has patient had a PCN reaction causing severe rash involving mucus membranes or skin necrosis: No Has patient had a PCN reaction that required hospitalization No Has patient had a PCN reaction occurring within the last 10 years: No If all of the above answers are "NO", then may proceed with Cephalosporin use.     Current Outpatient Medications  Medication Sig Dispense Refill   . acetaminophen (TYLENOL) 650 MG CR tablet Take 650 mg by mouth 2 (two) times daily.     Marland Kitchen amLODipine (NORVASC) 5 MG tablet Take 5 mg by mouth daily.     Marland Kitchen aspirin 81 MG EC tablet Take 1 tablet (81 mg total) by mouth every morning. Restart after 30 days when Xarelto has been completed. (Patient taking differently: Take 81 mg by mouth at bedtime. Restart after 30 days when Xarelto has been completed.) 30 tablet 12  . bimatoprost (LUMIGAN) 0.01 % SOLN Place 1 drop into both eyes at bedtime.     . carvedilol (COREG) 3.125 MG tablet TAKE 1 TABLET BY MOUTH TWICE A DAY (Patient taking differently: Take 3.125 mg by mouth 2 (two) times daily with a meal. ) 30 tablet 0  . levothyroxine (SYNTHROID, LEVOTHROID) 125 MCG tablet Take 125 mcg by mouth daily before breakfast.     . meclizine (ANTIVERT) 25 MG tablet Take 25 mg by mouth 3 (three) times daily as needed for dizziness.     Marland Kitchen omeprazole (PRILOSEC) 40 MG capsule Take 40 mg by mouth daily.      Marland Kitchen OVER THE COUNTER MEDICATION Take 1 capsule by mouth 2 (two) times daily. [employer]    . polyethylene glycol powder (GLYCOLAX/MIRALAX) powder Take 17 g by mouth daily as needed for mild constipation.     . ranitidine (ZANTAC) 75 MG tablet Take 75 mg by mouth daily.    . simvastatin (ZOCOR) 10 MG tablet TAKE 1 TABLET BY MOUTH EVERYDAY AT BEDTIME (Patient taking differently: Take 10 mg by mouth at bedtime. ) 30 tablet 6  . timolol (BETIMOL) 0.5 % ophthalmic  solution Place 1 drop into both eyes every morning.     No current facility-administered medications for this visit.     Past Medical History:  Diagnosis Date  . Arthritis    rt knee, back, hands  . Breast cancer ([place]) 2005   left  . Chest pain    February, 2014  . Cough    ACE cough 2012  . Ejection fraction    EF 60-65%, echo, February, 2009, (LV function normalized).  . GERD (gastroesophageal reflux disease)   . History of breast cancer    left  . HLD (hyperlipidemia)    mixed  .  HTN (hypertension)   . Hypothyroidism   . Personal history of chemotherapy 2005  . Personal history of radiation therapy 2005  . Pre-syncope    Hospital, June, 2014, probably dehydration  . Takotsubo cardiomyopathy   . Takotsubo syndrome    cardiac cath 11/08. minimal coronary disease, anteroseptal akinesis with an EF 55%.  echo 2/09 EF 60-65% (LV function normalized)    Past Surgical History:  Procedure Laterality Date  . BREAST BIOPSY  01/03/2012  . BREAST LUMPECTOMY Left 2005   left breast  . BUNIONECTOMY     left  . cholecystectomy    . GANGLION CYST EXCISION     left  . PARTIAL KNEE ARTHROPLASTY Right 12/28/2016   Procedure: RIGHT UNICOMPARTMENTAL KNEE;  Surgeon: Renette Butters, MD;  Location: [place];  Service: Orthopedics;  Laterality: Right;  . TOTAL ABDOMINAL HYSTERECTOMY  1997    Social History   Socioeconomic History  . Marital status: Widowed    Spouse name: Not on file  . Number of children: Not on file  . Years of education: Not on file  . Highest education level: Not on file  Occupational History  . Not on file  Social Needs  . Financial resource strain: Not on file  . Food insecurity:    Worry: Not on file    Inability: Not on file  . Transportation needs:    Medical: Not on file    Non-medical: Not on file  Tobacco Use  . Smoking status: Never Smoker  . Smokeless tobacco: Never Used  . Tobacco comment: tobacco use - no   Substance and Sexual Activity  . Alcohol use: No    Alcohol/week: 0.0 standard drinks  . Drug use: No  . Sexual activity: Not on file  Lifestyle  . Physical activity:    Days per week: Not on file    Minutes per session: Not on file  . Stress: Not on file  Relationships  . Social connections:    Talks on phone: Not on file    Gets together: Not on file    Attends religious service: Not on file    Active member of club or organization: Not on file    Attends meetings of clubs or organizations: Not on  file    Relationship status: Not on file  . Intimate partner violence:    Fear of current or ex partner: Not on file    Emotionally abused: Not on file    Physically abused: Not on file    Forced sexual activity: Not on file  Other Topics Concern  . Not on file  Social History Narrative   Widowed, works full time, gets regular exercise.      Vitals:   09/11/18 1306  BP: (!) 148/80  Pulse: 66  SpO2: 95%  Weight: 194 lb (88  kg)  Height: 5\' 5"  (1.651 m)    Wt Readings from Last 3 Encounters:  09/11/18 194 lb (88 kg)  12/28/16 190 lb 6.4 oz (86.4 kg)  12/07/16 196 lb (88.9 kg)     PHYSICAL EXAM General: NAD HEENT: Normal. Neck: No JVD, no thyromegaly. Lungs: Clear to auscultation bilaterally with normal respiratory effort. CV: Regular rate and rhythm, normal S1/S2, no S3/S4, no murmur. No pretibial or periankle edema.  No carotid bruit.   Abdomen: Soft, nontender, no distention.  Neurologic: Alert and oriented.  Psych: Normal affect. Skin: Normal. Musculoskeletal: No gross deformities.    ECG: Reviewed above under Subjective   Labs:    Lipids: Lab Results  Component Value Date/Time   [facility]  10/25/2007 12:58 AM    80        Total Cholesterol/HDL:CHD Risk Coronary Heart Disease Risk Table                     Men   Women  1/2 Average Risk   3.4   3.3   CHOL  10/25/2007 12:58 AM    161        ATP III CLASSIFICATION:  <200     mg/dL   Desirable  200-239  mg/dL   Borderline High  >=240    mg/dL   High   TRIG 53 10/25/2007 12:58 AM   HDL 70 10/25/2007 12:58 AM       ASSESSMENT AND PLAN:  1. Takotsubo cardiomyopathy: Subsequent normalization of left ventricular systolic function as noted above. Continue Coreg.  2. Essential HTN: Blood pressure is elevated in our today but is routinely checked at home and at her PCPs office and it is normal.  No changes to therapy.  3. Hyperlipidemia: Continue simvastatin 10 mg.  4. Chest pain: No further  episodes. Minimal CAD in 2008 by angiography.     Disposition: Follow up 1 year   Kate Sable, M.D., F.A.C.C.

## 2018-09-11 NOTE — Patient Instructions (Signed)

## 2018-09-15 ENCOUNTER — Encounter (HOSPITAL_COMMUNITY): Payer: Self-pay

## 2018-09-15 NOTE — Patient Instructions (Signed)
Your procedure is scheduled on: Tuesday, Oct. 22, 2019   Surgery Time:  11:30AM-1:30PM   Report to [facility]  Entrance    Report to admitting at 9:00 AM   Call this number if you have problems the morning of surgery [phone]   Do not eat food or drink liquids :After Midnight.   Brush your teeth the morning of surgery.   Do NOT smoke after Midnight   Take these medicines the morning of surgery with A SIP OF WATER: Amlodipine,Carvedilol, Levothyroxine, Omeprazole, Ranitidine, Eye drops                               You may not have any metal on your body including hair pins, jewelry, and body piercings             Do not wear make-up, lotions, powders, perfumes/cologne, or deodorant             Do not wear nail polish.  Do not shave  48 hours prior to surgery.                Do not bring valuables to the hospital. [place].   Contacts, dentures or bridgework may not be worn into surgery.   Leave suitcase in the car. After surgery it may be brought to your room.    Special Instructions: Bring a copy of your healthcare power of attorney and living will documents         the day of surgery if you haven't scanned them in before.              Please read over the following fact sheets you were given:  [facility] - Preparing for Surgery Before surgery, you can play an important role.  Because skin is not sterile, your skin needs to be as free of germs as possible.  You can reduce the number of germs on your skin by washing with CHG (chlorahexidine gluconate) soap before surgery.  CHG is an antiseptic cleaner which kills germs and bonds with the skin to continue killing germs even after washing. Please DO NOT use if you have an allergy to CHG or antibacterial soaps.  If your skin becomes reddened/irritated stop using the CHG and inform your nurse when you arrive at Short Stay. Do not shave (including legs and  underarms) for at least 48 hours prior to the first CHG shower.  You may shave your face/neck.  Please follow these instructions carefully:  1.  Shower with CHG Soap the night before surgery and the  morning of surgery.  2.  If you choose to wash your hair, wash your hair first as usual with your normal  shampoo.  3.  After you shampoo, rinse your hair and body thoroughly to remove the shampoo.                             4.  Use CHG as you would any other liquid soap.  You can apply chg directly to the skin and wash.  Gently with a scrungie or clean washcloth.  5.  Apply the CHG Soap to your body ONLY FROM THE NECK DOWN.   Do   not use on face/ open  Wound or open sores. Avoid contact with eyes, ears mouth and   genitals (private parts).                       Wash face,  Genitals (private parts) with your normal soap.             6.  Wash thoroughly, paying special attention to the area where your    surgery  will be performed.  7.  Thoroughly rinse your body with warm water from the neck down.  8.  DO NOT shower/wash with your normal soap after using and rinsing off the CHG Soap.                9.  Pat yourself dry with a clean towel.            10.  Wear clean pajamas.            11.  Place clean sheets on your bed the night of your first shower and do not  sleep with pets. Day of Surgery : Do not apply any lotions/deodorants the morning of surgery.  Please wear clean clothes to the hospital/surgery center.  FAILURE TO FOLLOW THESE INSTRUCTIONS MAY RESULT IN THE CANCELLATION OF YOUR SURGERY  PATIENT SIGNATURE_________________________________  NURSE SIGNATURE__________________________________  ________________________________________________________________________   Julie Jefferson  An incentive spirometer is a tool that can help keep your lungs clear and active. This tool measures how well you are filling your lungs with each breath. Taking long deep  breaths may help reverse or decrease the chance of developing breathing (pulmonary) problems (especially infection) following:  A long period of time when you are unable to move or be active. BEFORE THE PROCEDURE   If the spirometer includes an indicator to show your best effort, your nurse or respiratory therapist will set it to a desired goal.  If possible, sit up straight or lean slightly forward. Try not to slouch.  Hold the incentive spirometer in an upright position. INSTRUCTIONS FOR USE  1. Sit on the edge of your bed if possible, or sit up as far as you can in bed or on a chair. 2. Hold the incentive spirometer in an upright position. 3. Breathe out normally. 4. Place the mouthpiece in your mouth and seal your lips tightly around it. 5. Breathe in slowly and as deeply as possible, raising the piston or the ball toward the top of the column. 6. Hold your breath for 3-5 seconds or for as long as possible. Allow the piston or ball to fall to the bottom of the column. 7. Remove the mouthpiece from your mouth and breathe out normally. 8. Rest for a few seconds and repeat Steps 1 through 7 at least 10 times every 1-2 hours when you are awake. Take your time and take a few normal breaths between deep breaths. 9. The spirometer may include an indicator to show your best effort. Use the indicator as a goal to work toward during each repetition. 10. After each set of 10 deep breaths, practice coughing to be sure your lungs are clear. If you have an incision (the cut made at the time of surgery), support your incision when coughing by placing a pillow or rolled up towels firmly against it. Once you are able to get out of bed, walk around indoors and cough well. You may stop using the incentive spirometer when instructed by your caregiver.  RISKS AND COMPLICATIONS  Take your time  so you do not get dizzy or light-headed.  If you are in pain, you may need to take or ask for pain medication before  doing incentive spirometry. It is harder to take a deep breath if you are having pain. AFTER USE  Rest and breathe slowly and easily.  It can be helpful to keep track of a log of your progress. Your caregiver can provide you with a simple table to help with this. If you are using the spirometer at home, follow these instructions: [place] IF:   You are having difficultly using the spirometer.  You have trouble using the spirometer as often as instructed.  Your pain medication is not giving enough relief while using the spirometer.  You develop fever of 100.5 F (38.1 C) or higher. SEEK IMMEDIATE MEDICAL CARE IF:   You cough up bloody sputum that had not been present before.  You develop fever of 102 F (38.9 C) or greater.  You develop worsening pain at or near the incision site. MAKE SURE YOU:   Understand these instructions.  Will watch your condition.  Will get help right away if you are not doing well or get worse. Document Released: 04/01/2007 Document Revised: 02/11/2012 Document Reviewed: 06/02/2007 [facility] Patient Information 2014 [place], [place].   ________________________________________________________________________

## 2018-09-15 NOTE — Pre-Procedure Instructions (Signed)
Last office visit note and cardiac clearance note Dr. Bronson Ing in epic 09/11/18 EKG 09/11/18 in epic

## 2018-09-16 ENCOUNTER — Encounter (HOSPITAL_COMMUNITY)
Admission: RE | Admit: 2018-09-16 | Discharge: 2018-09-16 | Disposition: A | Discharge status: Home / Self Care | Payer: Medicare Other | Source: Ambulatory Visit | Attending: Orthopedic Surgery | Admitting: Orthopedic Surgery

## 2018-09-16 ENCOUNTER — Encounter (HOSPITAL_COMMUNITY): Payer: Self-pay

## 2018-09-16 ENCOUNTER — Other Ambulatory Visit: Payer: Self-pay

## 2018-09-16 DIAGNOSIS — Z01818 Encounter for other preprocedural examination: Secondary | ICD-10-CM | POA: Diagnosis not present

## 2018-09-16 DIAGNOSIS — M1712 Unilateral primary osteoarthritis, left knee: Secondary | ICD-10-CM | POA: Diagnosis not present

## 2018-09-16 HISTORY — DX: Other fracture of right lower leg, initial encounter for closed fracture: S82.891A

## 2018-09-16 HISTORY — DX: Localized edema: R60.0

## 2018-09-16 HISTORY — DX: Personal history of diseases of the blood and blood-forming organs and certain disorders involving the immune mechanism: Z86.2

## 2018-09-16 HISTORY — DX: Atherosclerosis of aorta: I70.0

## 2018-09-16 LAB — BASIC METABOLIC PANEL
Anion gap: 9 (ref 5–15)
BUN: 17 mg/dL (ref 8–23)
CHLORIDE: 106 mmol/L (ref 98–111)
CO2: 26 mmol/L (ref 22–32)
CREATININE: 0.76 mg/dL (ref 0.44–1.00)
Calcium: 9.7 mg/dL (ref 8.9–10.3)
GFR calc Af Amer: >60 mL/min (ref >60)
GLUCOSE: 93 mg/dL (ref 70–99)
POTASSIUM: 4.4 mmol/L (ref 3.5–5.1)
SODIUM: 141 mmol/L (ref 135–145)

## 2018-09-16 LAB — CBC
HEMATOCRIT: 38.5 % (ref 36.0–46.0)
HEMOGLOBIN: 12.1 g/dL (ref 12.0–15.0)
MCH: 28.1 pg (ref 26.0–34.0)
MCHC: 31.4 g/dL (ref 30.0–36.0)
MCV: 89.3 fL (ref 80.0–100.0)
Platelets: 305 10*3/uL (ref 150–400)
RBC: 4.31 MIL/uL (ref 3.87–5.11)
RDW: 13.7 % (ref 11.5–15.5)
WBC: 5.5 10*3/uL (ref 4.0–10.5)
nRBC: 0 % (ref 0.0–0.2)

## 2018-09-16 LAB — SURGICAL PCR SCREEN
MRSA, PCR: NEGATIVE
Staphylococcus aureus: NEGATIVE

## 2018-09-22 MED ORDER — BUPIVACAINE LIPOSOME 1.3 % IJ SUSP
20.0000 mL | INTRAMUSCULAR | Status: DC
  Filled 2018-09-22: qty 20

## 2018-09-22 NOTE — Pre-Procedure Instructions (Signed)
Ms. Oralia Rud made aware to arrive 09/23/2018 at 7:30AM verbalized understanding.

## 2018-09-23 ENCOUNTER — Encounter (HOSPITAL_COMMUNITY)
Admission: RE | Disposition: A | Discharge status: Home / Self Care | Payer: Self-pay | Source: Ambulatory Visit | Attending: Orthopedic Surgery

## 2018-09-23 ENCOUNTER — Observation Stay (HOSPITAL_COMMUNITY): Payer: Medicare Other

## 2018-09-23 ENCOUNTER — Observation Stay (HOSPITAL_COMMUNITY)
Admission: RE | Admit: 2018-09-23 | Discharge: 2018-09-24 | Disposition: A | Discharge status: Home / Self Care | Payer: Medicare Other | Source: Ambulatory Visit | Attending: Orthopedic Surgery | Admitting: Orthopedic Surgery

## 2018-09-23 ENCOUNTER — Inpatient Hospital Stay (HOSPITAL_COMMUNITY): Payer: Medicare Other | Admitting: Anesthesiology

## 2018-09-23 ENCOUNTER — Encounter (HOSPITAL_COMMUNITY): Payer: Self-pay | Admitting: *Deleted

## 2018-09-23 ENCOUNTER — Other Ambulatory Visit: Payer: Self-pay

## 2018-09-23 DIAGNOSIS — I5181 Takotsubo syndrome: Secondary | ICD-10-CM | POA: Insufficient documentation

## 2018-09-23 DIAGNOSIS — J449 Chronic obstructive pulmonary disease, unspecified: Secondary | ICD-10-CM | POA: Insufficient documentation

## 2018-09-23 DIAGNOSIS — Z96659 Presence of unspecified artificial knee joint: Secondary | ICD-10-CM

## 2018-09-23 DIAGNOSIS — K219 Gastro-esophageal reflux disease without esophagitis: Secondary | ICD-10-CM | POA: Diagnosis not present

## 2018-09-23 DIAGNOSIS — D649 Anemia, unspecified: Secondary | ICD-10-CM | POA: Insufficient documentation

## 2018-09-23 DIAGNOSIS — I2510 Atherosclerotic heart disease of native coronary artery without angina pectoris: Secondary | ICD-10-CM | POA: Insufficient documentation

## 2018-09-23 DIAGNOSIS — Z853 Personal history of malignant neoplasm of breast: Secondary | ICD-10-CM | POA: Diagnosis not present

## 2018-09-23 DIAGNOSIS — M1712 Unilateral primary osteoarthritis, left knee: Secondary | ICD-10-CM | POA: Diagnosis present

## 2018-09-23 DIAGNOSIS — E785 Hyperlipidemia, unspecified: Secondary | ICD-10-CM | POA: Diagnosis not present

## 2018-09-23 DIAGNOSIS — Z7901 Long term (current) use of anticoagulants: Secondary | ICD-10-CM | POA: Insufficient documentation

## 2018-09-23 DIAGNOSIS — E119 Type 2 diabetes mellitus without complications: Secondary | ICD-10-CM | POA: Diagnosis not present

## 2018-09-23 DIAGNOSIS — E7800 Pure hypercholesterolemia, unspecified: Secondary | ICD-10-CM

## 2018-09-23 DIAGNOSIS — E039 Hypothyroidism, unspecified: Secondary | ICD-10-CM | POA: Diagnosis present

## 2018-09-23 DIAGNOSIS — M1710 Unilateral primary osteoarthritis, unspecified knee: Secondary | ICD-10-CM | POA: Diagnosis present

## 2018-09-23 DIAGNOSIS — Z7982 Long term (current) use of aspirin: Secondary | ICD-10-CM | POA: Insufficient documentation

## 2018-09-23 DIAGNOSIS — I10 Essential (primary) hypertension: Secondary | ICD-10-CM

## 2018-09-23 DIAGNOSIS — Z9049 Acquired absence of other specified parts of digestive tract: Secondary | ICD-10-CM | POA: Insufficient documentation

## 2018-09-23 DIAGNOSIS — Z79899 Other long term (current) drug therapy: Secondary | ICD-10-CM | POA: Diagnosis not present

## 2018-09-23 HISTORY — PX: PARTIAL KNEE ARTHROPLASTY: SHX2174

## 2018-09-23 SURGERY — ARTHROPLASTY, KNEE, UNICOMPARTMENTAL
Anesthesia: Spinal | Site: Knee | Laterality: Left

## 2018-09-23 MED ORDER — SODIUM CHLORIDE 0.9 % IV SOLN
INTRAVENOUS | Status: DC | PRN
  Administered 2018-09-23: 11:00:00

## 2018-09-23 MED ORDER — ACETAMINOPHEN 500 MG PO TABS: 1000.0000 mg | ORAL_TABLET | Freq: Three times a day (TID) | ORAL | 0 refills | Status: AC

## 2018-09-23 MED ORDER — SIMVASTATIN 20 MG PO TABS
10.0000 mg | ORAL_TABLET | Freq: Every day | ORAL | Status: DC
  Administered 2018-09-23: 10 mg via ORAL
  Filled 2018-09-23: qty 1

## 2018-09-23 MED ORDER — ASPIRIN 81 MG PO CHEW
81.0000 mg | CHEWABLE_TABLET | Freq: Two times a day (BID) | ORAL | Status: DC
  Administered 2018-09-23 – 2018-09-24 (×2): 81 mg via ORAL
  Filled 2018-09-23 (×2): qty 1

## 2018-09-23 MED ORDER — MENTHOL 3 MG MT LOZG: 1.0000 | LOZENGE | OROMUCOSAL | Status: DC | PRN

## 2018-09-23 MED ORDER — PROPOFOL 500 MG/50ML IV EMUL
INTRAVENOUS | Status: DC | PRN
  Administered 2018-09-23: 75 ug/kg/min via INTRAVENOUS

## 2018-09-23 MED ORDER — OXYCODONE HCL 5 MG PO TABS: 5.0000 mg | ORAL_TABLET | ORAL | 0 refills | Status: AC | PRN

## 2018-09-23 MED ORDER — ONDANSETRON HCL 4 MG/2ML IJ SOLN
INTRAMUSCULAR | Status: AC
  Filled 2018-09-23: qty 2

## 2018-09-23 MED ORDER — METHOCARBAMOL 500 MG IVPB - SIMPLE MED
500.0000 mg | Freq: Four times a day (QID) | INTRAVENOUS | Status: DC | PRN
  Filled 2018-09-23: qty 50

## 2018-09-23 MED ORDER — FENTANYL CITRATE (PF) 100 MCG/2ML IJ SOLN
INTRAMUSCULAR | Status: AC
  Filled 2018-09-23: qty 2

## 2018-09-23 MED ORDER — MIDAZOLAM HCL 5 MG/5ML IJ SOLN: INTRAMUSCULAR | Status: DC | PRN

## 2018-09-23 MED ORDER — LACTATED RINGERS IV SOLN
INTRAVENOUS | Status: DC
  Administered 2018-09-23 – 2018-09-24 (×3): via INTRAVENOUS

## 2018-09-23 MED ORDER — TIMOLOL MALEATE 0.5 % OP SOLN
1.0000 [drp] | OPHTHALMIC | Status: DC
  Administered 2018-09-23 – 2018-09-24 (×2): 1 [drp] via OPHTHALMIC
  Filled 2018-09-23: qty 5

## 2018-09-23 MED ORDER — MAGNESIUM CITRATE PO SOLN: 1.0000 | Freq: Once | ORAL | Status: DC | PRN

## 2018-09-23 MED ORDER — SORBITOL 70 % SOLN
30.0000 mL | Freq: Every day | Status: DC | PRN
  Filled 2018-09-23: qty 30

## 2018-09-23 MED ORDER — FAMOTIDINE 20 MG PO TABS
20.0000 mg | ORAL_TABLET | Freq: Every day | ORAL | Status: DC
  Administered 2018-09-23 – 2018-09-24 (×2): 20 mg via ORAL
  Filled 2018-09-23 (×2): qty 1

## 2018-09-23 MED ORDER — OXYCODONE HCL 5 MG PO TABS
5.0000 mg | ORAL_TABLET | ORAL | Status: DC | PRN
  Administered 2018-09-23: 10 mg via ORAL
  Administered 2018-09-24 (×2): 5 mg via ORAL
  Filled 2018-09-23: qty 1
  Filled 2018-09-23: qty 2
  Filled 2018-09-23 (×2): qty 1

## 2018-09-23 MED ORDER — MIDAZOLAM HCL 2 MG/2ML IJ SOLN
INTRAMUSCULAR | Status: AC
  Filled 2018-09-23: qty 2

## 2018-09-23 MED ORDER — BUPIVACAINE IN DEXTROSE 0.75-8.25 % IT SOLN
INTRATHECAL | Status: DC | PRN
  Administered 2018-09-23: 1.6 mL via INTRATHECAL

## 2018-09-23 MED ORDER — GABAPENTIN 300 MG PO CAPS
300.0000 mg | ORAL_CAPSULE | Freq: Once | ORAL | Status: AC
Start: 2018-09-23 — End: 2018-09-23
  Administered 2018-09-23: 300 mg via ORAL
  Filled 2018-09-23: qty 1

## 2018-09-23 MED ORDER — PROPOFOL 10 MG/ML IV BOLUS
INTRAVENOUS | Status: DC | PRN
  Administered 2018-09-23: 20 mg via INTRAVENOUS

## 2018-09-23 MED ORDER — SODIUM CHLORIDE 0.9 % IR SOLN
Status: DC | PRN
  Administered 2018-09-23: 1000 mL

## 2018-09-23 MED ORDER — ACETAMINOPHEN 500 MG PO TABS
1000.0000 mg | ORAL_TABLET | Freq: Once | ORAL | Status: AC
  Administered 2018-09-23: 1000 mg via ORAL
  Filled 2018-09-23: qty 2

## 2018-09-23 MED ORDER — ROPIVACAINE HCL 7.5 MG/ML IJ SOLN
INTRAMUSCULAR | Status: DC | PRN
  Administered 2018-09-23: 20 mL via PERINEURAL

## 2018-09-23 MED ORDER — TRANEXAMIC ACID-NACL 1000-0.7 MG/100ML-% IV SOLN
1000.0000 mg | INTRAVENOUS | Status: AC
  Administered 2018-09-23: 1000 mg via INTRAVENOUS
  Filled 2018-09-23: qty 100

## 2018-09-23 MED ORDER — ONDANSETRON HCL 4 MG PO TABS: 4.0000 mg | ORAL_TABLET | Freq: Four times a day (QID) | ORAL | Status: DC | PRN

## 2018-09-23 MED ORDER — DOCUSATE SODIUM 100 MG PO CAPS: 100.0000 mg | ORAL_CAPSULE | Freq: Two times a day (BID) | ORAL | 0 refills | Status: DC

## 2018-09-23 MED ORDER — CLINDAMYCIN PHOSPHATE 900 MG/50ML IV SOLN
900.0000 mg | INTRAVENOUS | Status: AC
  Administered 2018-09-23: 900 mg via INTRAVENOUS
  Filled 2018-09-23: qty 50

## 2018-09-23 MED ORDER — CARVEDILOL 3.125 MG PO TABS
3.1250 mg | ORAL_TABLET | Freq: Two times a day (BID) | ORAL | Status: DC
  Administered 2018-09-23 – 2018-09-24 (×2): 3.125 mg via ORAL
  Filled 2018-09-23 (×2): qty 1

## 2018-09-23 MED ORDER — PHENYLEPHRINE HCL 10 MG/ML IJ SOLN
INTRAMUSCULAR | Status: DC | PRN
  Administered 2018-09-23: 35 ug/min via INTRAVENOUS
  Administered 2018-09-23: 25 ug/min via INTRAVENOUS

## 2018-09-23 MED ORDER — POLYETHYLENE GLYCOL 3350 17 G PO PACK
17.0000 g | PACK | Freq: Every day | ORAL | Status: DC | PRN
  Administered 2018-09-24: 17 g via ORAL
  Filled 2018-09-23: qty 1

## 2018-09-23 MED ORDER — VANCOMYCIN HCL IN DEXTROSE 1-5 GM/200ML-% IV SOLN
1000.0000 mg | INTRAVENOUS | Status: AC
  Administered 2018-09-23: 1000 mg via INTRAVENOUS
  Filled 2018-09-23: qty 200

## 2018-09-23 MED ORDER — FENTANYL CITRATE (PF) 100 MCG/2ML IJ SOLN
INTRAMUSCULAR | Status: AC
  Administered 2018-09-23: 100 ug
  Filled 2018-09-23: qty 2

## 2018-09-23 MED ORDER — GABAPENTIN 300 MG PO CAPS
300.0000 mg | ORAL_CAPSULE | Freq: Three times a day (TID) | ORAL | Status: DC
  Administered 2018-09-23 – 2018-09-24 (×3): 300 mg via ORAL
  Filled 2018-09-23 (×3): qty 1

## 2018-09-23 MED ORDER — STERILE WATER FOR IRRIGATION IR SOLN
Status: DC | PRN
  Administered 2018-09-23: 2000 mL

## 2018-09-23 MED ORDER — METOCLOPRAMIDE HCL 5 MG/ML IJ SOLN: 5.0000 mg | Freq: Three times a day (TID) | INTRAMUSCULAR | Status: DC | PRN

## 2018-09-23 MED ORDER — ONDANSETRON HCL 4 MG PO TABS: 4.0000 mg | ORAL_TABLET | Freq: Three times a day (TID) | ORAL | 0 refills | Status: DC | PRN

## 2018-09-23 MED ORDER — DEXAMETHASONE SODIUM PHOSPHATE 10 MG/ML IJ SOLN
10.0000 mg | Freq: Once | INTRAMUSCULAR | Status: AC
  Administered 2018-09-24: 10 mg via INTRAVENOUS
  Filled 2018-09-23: qty 1

## 2018-09-23 MED ORDER — CLINDAMYCIN PHOSPHATE 600 MG/50ML IV SOLN
600.0000 mg | Freq: Four times a day (QID) | INTRAVENOUS | Status: AC
  Administered 2018-09-23 (×2): 600 mg via INTRAVENOUS
  Filled 2018-09-23 (×2): qty 50

## 2018-09-23 MED ORDER — PANTOPRAZOLE SODIUM 40 MG PO TBEC
40.0000 mg | DELAYED_RELEASE_TABLET | Freq: Every day | ORAL | Status: DC
  Administered 2018-09-24: 40 mg via ORAL
  Filled 2018-09-23: qty 1

## 2018-09-23 MED ORDER — DOCUSATE SODIUM 100 MG PO CAPS
100.0000 mg | ORAL_CAPSULE | Freq: Two times a day (BID) | ORAL | Status: DC
  Administered 2018-09-23 – 2018-09-24 (×2): 100 mg via ORAL
  Filled 2018-09-23 (×2): qty 1

## 2018-09-23 MED ORDER — ONDANSETRON HCL 4 MG/2ML IJ SOLN: 4.0000 mg | Freq: Four times a day (QID) | INTRAMUSCULAR | Status: DC | PRN

## 2018-09-23 MED ORDER — ONDANSETRON HCL 4 MG/2ML IJ SOLN
INTRAMUSCULAR | Status: DC | PRN
  Administered 2018-09-23 (×2): 4 mg via INTRAVENOUS

## 2018-09-23 MED ORDER — CLONIDINE HCL (ANALGESIA) 100 MCG/ML EP SOLN
EPIDURAL | Status: DC | PRN
  Administered 2018-09-23: 50 ug

## 2018-09-23 MED ORDER — KETOROLAC TROMETHAMINE 15 MG/ML IJ SOLN
7.5000 mg | Freq: Four times a day (QID) | INTRAMUSCULAR | Status: DC
  Administered 2018-09-23 – 2018-09-24 (×3): 7.5 mg via INTRAVENOUS
  Filled 2018-09-23 (×3): qty 1

## 2018-09-23 MED ORDER — PROMETHAZINE HCL 25 MG/ML IJ SOLN: 6.2500 mg | INTRAMUSCULAR | Status: DC | PRN

## 2018-09-23 MED ORDER — DEXAMETHASONE SODIUM PHOSPHATE 10 MG/ML IJ SOLN
INTRAMUSCULAR | Status: DC | PRN
  Administered 2018-09-23: 10 mg via INTRAVENOUS

## 2018-09-23 MED ORDER — 0.9 % SODIUM CHLORIDE (POUR BTL) OPTIME
TOPICAL | Status: DC | PRN
  Administered 2018-09-23: 1000 mL

## 2018-09-23 MED ORDER — ACETAMINOPHEN 500 MG PO TABS
1000.0000 mg | ORAL_TABLET | Freq: Three times a day (TID) | ORAL | Status: DC
  Administered 2018-09-23 – 2018-09-24 (×3): 1000 mg via ORAL
  Filled 2018-09-23 (×3): qty 2

## 2018-09-23 MED ORDER — DEXAMETHASONE SODIUM PHOSPHATE 10 MG/ML IJ SOLN
INTRAMUSCULAR | Status: AC
  Filled 2018-09-23: qty 1

## 2018-09-23 MED ORDER — SODIUM CHLORIDE 0.9 % IJ SOLN
INTRAMUSCULAR | Status: AC
  Filled 2018-09-23: qty 50

## 2018-09-23 MED ORDER — CHLORHEXIDINE GLUCONATE 4 % EX LIQD: 60.0000 mL | Freq: Once | CUTANEOUS | Status: DC

## 2018-09-23 MED ORDER — LEVOTHYROXINE SODIUM 125 MCG PO TABS
125.0000 ug | ORAL_TABLET | Freq: Every day | ORAL | Status: DC
  Administered 2018-09-24: 125 ug via ORAL
  Filled 2018-09-23: qty 1

## 2018-09-23 MED ORDER — DIPHENHYDRAMINE HCL 12.5 MG/5ML PO ELIX: 12.5000 mg | ORAL_SOLUTION | ORAL | Status: DC | PRN

## 2018-09-23 MED ORDER — ACETAMINOPHEN 325 MG PO TABS: 325.0000 mg | ORAL_TABLET | Freq: Four times a day (QID) | ORAL | Status: DC | PRN

## 2018-09-23 MED ORDER — ASPIRIN EC 81 MG PO TBEC: 81.0000 mg | DELAYED_RELEASE_TABLET | Freq: Two times a day (BID) | ORAL | 0 refills | Status: DC

## 2018-09-23 MED ORDER — METHOCARBAMOL 500 MG PO TABS: 500.0000 mg | ORAL_TABLET | Freq: Four times a day (QID) | ORAL | Status: DC | PRN

## 2018-09-23 MED ORDER — PROPOFOL 10 MG/ML IV BOLUS
INTRAVENOUS | Status: AC
  Filled 2018-09-23: qty 60

## 2018-09-23 MED ORDER — LATANOPROST 0.005 % OP SOLN
1.0000 [drp] | Freq: Every day | OPHTHALMIC | Status: DC
  Administered 2018-09-23: 1 [drp] via OPHTHALMIC
  Filled 2018-09-23: qty 2.5

## 2018-09-23 MED ORDER — MIDAZOLAM HCL 5 MG/5ML IJ SOLN
INTRAMUSCULAR | Status: DC | PRN
  Administered 2018-09-23: 0.5 mg via INTRAVENOUS

## 2018-09-23 MED ORDER — HYDROMORPHONE HCL 1 MG/ML IJ SOLN: 0.2500 mg | INTRAMUSCULAR | Status: DC | PRN

## 2018-09-23 MED ORDER — METOCLOPRAMIDE HCL 5 MG PO TABS: 5.0000 mg | ORAL_TABLET | Freq: Three times a day (TID) | ORAL | Status: DC | PRN

## 2018-09-23 MED ORDER — LACTATED RINGERS IV SOLN
INTRAVENOUS | Status: DC
  Administered 2018-09-23 (×2): via INTRAVENOUS

## 2018-09-23 MED ORDER — GABAPENTIN 300 MG PO CAPS: 300.0000 mg | ORAL_CAPSULE | Freq: Two times a day (BID) | ORAL | 0 refills | Status: DC

## 2018-09-23 MED ORDER — HYDROMORPHONE HCL 1 MG/ML IJ SOLN: 0.5000 mg | INTRAMUSCULAR | Status: DC | PRN

## 2018-09-23 MED ORDER — POVIDONE-IODINE 10 % EX SWAB: 2.0000 "application " | Freq: Once | CUTANEOUS | Status: DC

## 2018-09-23 MED ORDER — AMLODIPINE BESYLATE 5 MG PO TABS
5.0000 mg | ORAL_TABLET | Freq: Every day | ORAL | Status: DC
  Administered 2018-09-24: 5 mg via ORAL
  Filled 2018-09-23: qty 1

## 2018-09-23 MED ORDER — PHENOL 1.4 % MT LIQD
1.0000 | OROMUCOSAL | Status: DC | PRN
  Filled 2018-09-23: qty 177

## 2018-09-23 MED ORDER — METHOCARBAMOL 500 MG PO TABS: 500.0000 mg | ORAL_TABLET | Freq: Three times a day (TID) | ORAL | 0 refills | Status: DC | PRN

## 2018-09-23 SURGICAL SUPPLY — 51 items
BANDAGE ELASTIC 6 VELCRO ST LF (GAUZE/BANDAGES/DRESSINGS) ×2 IMPLANT
BEARING MENISCAL TIBIAL 4 SM L (Orthopedic Implant) ×2 IMPLANT
BOWL SMART MIX CTS (DISPOSABLE) ×3 IMPLANT
BRNG TIB SM 4 PHS 3 LT MEN (Orthopedic Implant) ×1 IMPLANT
CEMENT BONE R 1X40 (Cement) ×2 IMPLANT
CHLORAPREP W/TINT 26ML (MISCELLANEOUS) ×3 IMPLANT
CLOSURE STERI-STRIP 1/2X4 (GAUZE/BANDAGES/DRESSINGS) ×1
CLOSURE WOUND 1/2 X4 (GAUZE/BANDAGES/DRESSINGS) ×1
CLSR STERI-STRIP ANTIMIC 1/2X4 (GAUZE/BANDAGES/DRESSINGS) ×1 IMPLANT
COMPONENT TIBIA MEDL OXFRD LFT (Joint) IMPLANT
COVER SURGICAL LIGHT HANDLE (MISCELLANEOUS) ×3 IMPLANT
COVER WAND RF STERILE (DRAPES) IMPLANT
CUFF TOURN SGL QUICK 34 (TOURNIQUET CUFF) ×3
CUFF TRNQT CYL 34X4X40X1 (TOURNIQUET CUFF) ×1 IMPLANT
DRAPE ARTHROSCOPY W/POUCH 114 (DRAPES) ×3 IMPLANT
DRAPE IMP U-DRAPE 54X76 (DRAPES) ×6 IMPLANT
DRAPE U-SHAPE 47X51 STRL (DRAPES) ×3 IMPLANT
DRSG MEPILEX BORDER 4X4 (GAUZE/BANDAGES/DRESSINGS) IMPLANT
DRSG MEPILEX BORDER 4X8 (GAUZE/BANDAGES/DRESSINGS) ×3 IMPLANT
ELECT REM PT RETURN 15FT ADLT (MISCELLANEOUS) ×3 IMPLANT
GLOVE BIOGEL PI IND STRL 8 (GLOVE) ×2 IMPLANT
GLOVE BIOGEL PI INDICATOR 8 (GLOVE) ×4
GOWN STRL REUS W/ TWL LRG LVL3 (GOWN DISPOSABLE) ×1 IMPLANT
GOWN STRL REUS W/ TWL XL LVL3 (GOWN DISPOSABLE) ×1 IMPLANT
GOWN STRL REUS W/TWL LRG LVL3 (GOWN DISPOSABLE) ×3
GOWN STRL REUS W/TWL XL LVL3 (GOWN DISPOSABLE) ×3
HANDPIECE INTERPULSE COAX TIP (DISPOSABLE) ×3
IMMOBILIZER KNEE 20 (SOFTGOODS) ×5 IMPLANT
IMMOBILIZER KNEE 20 THIGH 36 (SOFTGOODS) ×1 IMPLANT
MANIFOLD NEPTUNE II (INSTRUMENTS) ×3 IMPLANT
NS IRRIG 1000ML POUR BTL (IV SOLUTION) ×3 IMPLANT
PACK BLADE SAW RECIP 70 3 PT (BLADE) ×2 IMPLANT
PACK ICE MAXI GEL EZY WRAP (MISCELLANEOUS) ×3 IMPLANT
PACK TOTAL KNEE CUSTOM (KITS) ×3 IMPLANT
PEG FEMORAL PEGGED STRL SM (Knees) ×2 IMPLANT
POSITIONER SURGICAL ARM (MISCELLANEOUS) ×3 IMPLANT
SET HNDPC FAN SPRY TIP SCT (DISPOSABLE) ×1 IMPLANT
STRIP CLOSURE SKIN 1/2X4 (GAUZE/BANDAGES/DRESSINGS) ×2 IMPLANT
SUCTION FRAZIER HANDLE 10FR (MISCELLANEOUS) ×2
SUCTION TUBE FRAZIER 10FR DISP (MISCELLANEOUS) ×1 IMPLANT
SUT MNCRL AB 4-0 PS2 18 (SUTURE) ×3 IMPLANT
SUT VIC AB 0 CT1 27 (SUTURE) ×3
SUT VIC AB 0 CT1 27XBRD ANTBC (SUTURE) ×1 IMPLANT
SUT VIC AB 1 CT1 27 (SUTURE) ×3
SUT VIC AB 1 CT1 27XBRD ANTBC (SUTURE) ×1 IMPLANT
SUT VIC AB 2-0 CT1 27 (SUTURE) ×3
SUT VIC AB 2-0 CT1 TAPERPNT 27 (SUTURE) ×1 IMPLANT
TIBIA MEDIAL OXFORD LEFT (Joint) ×3 IMPLANT
TRAY FOLEY BAG SILVER LF 16FR (CATHETERS) ×2 IMPLANT
WRAP KNEE MAXI GEL POST OP (GAUZE/BANDAGES/DRESSINGS) ×2 IMPLANT
YANKAUER SUCT BULB TIP 10FT TU (MISCELLANEOUS) ×3 IMPLANT

## 2018-09-23 NOTE — Transfer of Care (Signed)
Immediate Anesthesia Transfer of Care Note  Patient: Gerri Lins  Procedure(s) Performed: UNICOMPARTMENTAL LEFT  KNEE (Left Knee)  Patient Location: PACU  Anesthesia Type:Spinal  Level of Consciousness: awake, alert  and oriented  Airway & Oxygen Therapy: Patient Spontanous Breathing and Patient connected to face mask oxygen  Post-op Assessment: Report given to RN and Post -op Vital signs reviewed and stable  Post vital signs: stable  Last Vitals:  Vitals Value Taken Time  BP 115/65 09/23/2018 12:10 PM  Temp    Pulse 57 09/23/2018 12:14 PM  Resp 14 09/23/2018 12:14 PM  SpO2 100 % 09/23/2018 12:14 PM  Vitals shown include unvalidated device data.  Last Pain:  Vitals:   09/23/18 0750  TempSrc: Oral         Complications: No apparent anesthesia complications

## 2018-09-23 NOTE — Anesthesia Procedure Notes (Signed)
Spinal  Patient location during procedure: OR End time: 09/23/2018 10:21 AM Staffing Resident/CRNA: Lissa Morales, CRNA Preanesthetic Checklist Completed: patient identified, site marked, surgical consent, pre-op evaluation, timeout performed, IV checked, risks and benefits discussed and monitors and equipment checked Spinal Block Patient position: sitting Prep: DuraPrep Patient monitoring: heart rate, continuous pulse ox and blood pressure Approach: midline Location: L3-4 Injection technique: single-shot Needle Needle type: Pencan  Needle gauge: 24 G Needle length: 9 cm Additional Notes Expiration date of kit checked and confirmed. Patient tolerated procedure well, without complications.

## 2018-09-23 NOTE — Anesthesia Preprocedure Evaluation (Signed)
Anesthesia Evaluation  Patient identified by MRN, date of birth, ID band Patient awake    Reviewed: Allergy & Precautions, NPO status , Patient's Chart, lab work & pertinent test results  Airway Mallampati: II  TM Distance: >3 FB Neck ROM: Full    Dental no notable dental hx.    Pulmonary neg pulmonary ROS,    Pulmonary exam normal breath sounds clear to auscultation       Cardiovascular hypertension, Normal cardiovascular exam Rhythm:Regular Rate:Normal     Neuro/Psych negative neurological ROS  negative psych ROS   GI/Hepatic Neg liver ROS, GERD  Medicated,  Endo/Other  Hypothyroidism   Renal/GU negative Renal ROS  negative genitourinary   Musculoskeletal negative musculoskeletal ROS (+)   Abdominal   Peds negative pediatric ROS (+)  Hematology negative hematology ROS (+)   Anesthesia Other Findings   Reproductive/Obstetrics negative OB ROS                             Anesthesia Physical Anesthesia Plan  ASA: II  Anesthesia Plan: Spinal   Post-op Pain Management:  Regional for Post-op pain   Induction: Intravenous  PONV Risk Score and Plan: 2 and Ondansetron and Dexamethasone  Airway Management Planned: Simple Face Mask  Additional Equipment:   Intra-op Plan:   Post-operative Plan:   Informed Consent: I have reviewed the patients History and Physical, chart, labs and discussed the procedure including the risks, benefits and alternatives for the proposed anesthesia with the patient or authorized representative who has indicated his/her understanding and acceptance.   Dental advisory given  Plan Discussed with: CRNA and Surgeon  Anesthesia Plan Comments:         Anesthesia Quick Evaluation

## 2018-09-23 NOTE — Evaluation (Signed)
Physical Therapy Evaluation Patient Details Name: Julie Jefferson MRN: [id number] DOB: [birth year] Today's Date: 09/23/2018   History of Present Illness  72 yo female s/p L uni-knee arthroplasty 09/23/18. Hx of R uni-knee arthroplasty 2018, breast cancer.   Clinical Impression  On eval POD 0, pt required Min assist for mobility. She walked ~55 feet with a RW. Pt c/o some nausea during session. Will follow and progress activity as tolerated. Plan is for d/c home on tomorrow with OP PT f/u.    Follow Up Recommendations Follow surgeon's recommendation for DC plan and follow-up therapies    Equipment Recommendations  None recommended by PT    Recommendations for Other Services       Precautions / Restrictions Precautions Precautions: Fall;Knee Required Braces or Orthoses: Knee Immobilizer - Left Knee Immobilizer - Left: Discontinue once straight leg raise with < 10 degree lag Restrictions Weight Bearing Restrictions: No Other Position/Activity Restrictions: WBAT      Mobility  Bed Mobility Overal bed mobility: Needs Assistance Bed Mobility: Supine to Sit     Supine to sit: Min assist;HOB elevated     General bed mobility comments: Assist for L LE. Increased time.   Transfers Overall transfer level: Needs assistance Equipment used: Rolling walker (2 wheeled) Transfers: Sit to/from Stand Sit to Stand: Min assist         General transfer comment: VCs safety, technique, hand/LE placement. Assist to rise, stabilize, control descent.   Ambulation/Gait Ambulation/Gait assistance: Min assist Gait Distance (Feet): 55 Feet Assistive device: Rolling walker (2 wheeled) Gait Pattern/deviations: Step-to pattern     General Gait Details: VCs safety, sequence. Assist to steady towards end of dizziness. Pt c/o some nausea.   Stairs            Wheelchair Mobility    Modified Rankin (Stroke Patients Only)       Balance Overall balance assessment: Mild deficits  observed, not formally tested                                           Pertinent Vitals/Pain Pain Assessment: 0-10 Pain Score: 5  Pain Location: L knee Pain Descriptors / Indicators: Aching;Sore Pain Intervention(s): Monitored during session;Repositioned    Home Living Family/patient expects to be discharged to:: Private residence Living Arrangements: Alone Available Help at Discharge: Family(going to daughter's home) Type of Home: House(daughter's home) Home Access: Stairs to enter Entrance Stairs-Rails: Right Entrance Stairs-Number of Steps: 3 Home Layout: Able to live on main level with bedroom/bathroom Home Equipment: Walker - 2 wheels;Shower seat      Prior Function Level of Independence: Independent               Hand Dominance        Extremity/Trunk Assessment   Upper Extremity Assessment Upper Extremity Assessment: Overall WFL for tasks assessed    Lower Extremity Assessment Lower Extremity Assessment: Generalized weakness(s/p L knee sg)    Cervical / Trunk Assessment Cervical / Trunk Assessment: Normal  Communication   Communication: No difficulties  Cognition Arousal/Alertness: Awake/alert Behavior During Therapy: WFL for tasks assessed/performed Overall Cognitive Status: Within Functional Limits for tasks assessed                                        General Comments  Exercises     Assessment/Plan    PT Assessment Patient needs continued PT services  PT Problem List Decreased strength;Decreased range of motion;Decreased balance;Decreased mobility;Decreased activity tolerance;Pain;Decreased knowledge of use of DME       PT Treatment Interventions DME instruction;Gait training;Therapeutic activities;Functional mobility training;Balance training;Patient/family education;Stair training;Therapeutic exercise    PT Goals (Current goals can be found in the Care Plan section)  Acute Rehab PT  Goals Patient Stated Goal: to regain PLOF PT Goal Formulation: With patient/family Time For Goal Achievement: 10/07/18 Potential to Achieve Goals: Good    Frequency 7X/week   Barriers to discharge        Co-evaluation               AM-PAC PT "6 Clicks" Daily Activity  Outcome Measure Difficulty turning over in bed (including adjusting bedclothes, sheets and blankets)?: A Little Difficulty moving from lying on back to sitting on the side of the bed? : Unable Difficulty sitting down on and standing up from a chair with arms (e.g., wheelchair, bedside commode, etc,.)?: Unable Help needed moving to and from a bed to chair (including a wheelchair)?: A Little Help needed walking in hospital room?: A Little Help needed climbing 3-5 steps with a railing? : A Little 6 Click Score: 14    End of Session Equipment Utilized During Treatment: Left knee immobilizer;Gait belt Activity Tolerance: Patient tolerated treatment well Patient left: in chair;with call bell/phone within reach;with family/visitor present   PT Visit Diagnosis: Pain;Other abnormalities of gait and mobility (R26.89);Difficulty in walking, not elsewhere classified (R26.2) Pain - Right/Left: Left Pain - part of body: Knee    Time: 2902-1115 PT Time Calculation (min) (ACUTE ONLY): 24 min   Charges:   PT Evaluation $PT Eval Low Complexity: 1 Low PT Treatments $Gait Training: 8-22 mins          Weston Anna, PT Acute Rehabilitation Services Pager: [phone] Office: [phone]

## 2018-09-23 NOTE — Anesthesia Procedure Notes (Signed)
Anesthesia Procedure Image    

## 2018-09-23 NOTE — Anesthesia Procedure Notes (Addendum)
Anesthesia Regional Block: Adductor canal block   Pre-Anesthetic Checklist: ,, timeout performed, Correct Patient, Correct Site, Correct Laterality, Correct Procedure, Correct Position, site marked, Risks and benefits discussed,  Surgical consent,  Pre-op evaluation,  At surgeon's request and post-op pain management  Laterality: Left  Prep: chloraprep       Needles:  Injection technique: Single-shot  Needle Type: Echogenic Needle     Needle Length: 9cm      Additional Needles:   Procedures:,,,, ultrasound used (permanent image in chart),,,,  Narrative:  Start time: 09/23/2018 9:10 AM End time: 09/23/2018 9:19 AM Injection made incrementally with aspirations every 5 mL.  Performed by: Personally  Anesthesiologist: Myrtie Soman, MD  Additional Notes: Patient tolerated the procedure well without complications

## 2018-09-23 NOTE — Discharge Instructions (Signed)
You may bear weight as tolerated. °Keep your dressing on and dry until follow up. °Take medicine to prevent blood clots as directed. °Take pain medicine as needed with the goal of transitioning to over the counter medicines.  °If needed, you may increase pain medication for the first few days post op - up to 2 tablets every 4 hours.  Stop as needed pain medication as soon as you are able. ° °INSTRUCTIONS AFTER JOINT REPLACEMENT  ° °o Remove items at home which could result in a fall. This includes throw rugs or furniture in walking pathways °o ICE to the affected joint every three hours while awake for 30 minutes at a time, for at least the first 3-5 days, and then as needed for pain and swelling.  Continue to use ice for pain and swelling. You may notice swelling that will progress down to the foot and ankle.  This is normal after surgery.  Elevate your leg when you are not up walking on it.   °o Continue to use the breathing machine you got in the hospital (incentive spirometer) which will help keep your temperature down.  It is common for your temperature to cycle up and down following surgery, especially at night when you are not up moving around and exerting yourself.  The breathing machine keeps your lungs expanded and your temperature down. ° ° °DIET:  As you were doing prior to hospitalization, we recommend a well-balanced diet. ° °DRESSING / WOUND CARE / SHOWERING ° °You may shower 3 days after surgery, but keep the wounds dry during showering.  You may use an occlusive plastic wrap (Press'n Seal for example) with blue painter's tape at edges, NO SOAKING/SUBMERGING IN THE BATHTUB.  If the bandage gets wet, change with a clean dry gauze.  If the incision gets wet, pat the wound dry with a clean towel. ° °ACTIVITY ° °o Increase activity slowly as tolerated, but follow the weight bearing instructions below.   °o No driving for 6 weeks or until further direction given by your physician.  You cannot drive while  taking narcotics.  °o No lifting or carrying greater than 10 lbs. until further directed by your surgeon. °o Avoid periods of inactivity such as sitting longer than an hour when not asleep. This helps prevent blood clots.  °o You may return to work once you are authorized by your doctor.  ° ° ° °WEIGHT BEARING  ° °Weight bearing as tolerated with assist device (walker, cane, etc) as directed, use it as long as suggested by your surgeon or therapist, typically at least 4-6 weeks. ° ° °EXERCISES ° °Results after joint replacement surgery are often greatly improved when you follow the exercise, range of motion and muscle strengthening exercises prescribed by your doctor. Safety measures are also important to protect the joint from further injury. Any time any of these exercises cause you to have increased pain or swelling, decrease what you are doing until you are comfortable again and then slowly increase them. If you have problems or questions, call your caregiver or physical therapist for advice.  ° °Rehabilitation is important following a joint replacement. After just a few days of immobilization, the muscles of the leg can become weakened and shrink (atrophy).  These exercises are designed to build up the tone and strength of the thigh and leg muscles and to improve motion. Often times heat used for twenty to thirty minutes before working out will loosen up your tissues and help with   improving the range of motion but do not use heat for the first two weeks following surgery (sometimes heat can increase post-operative swelling).  ° °These exercises can be done on a training (exercise) mat, on the floor, on a table or on a bed. Use whatever works the best and is most comfortable for you.    Use music or television while you are exercising so that the exercises are a pleasant break in your day. This will make your life better with the exercises acting as a break in your routine that you can look forward to.   Perform  all exercises about fifteen times, three times per day or as directed.  You should exercise both the operative leg and the other leg as well. ° °Exercises include: °  °• Quad Sets - Tighten up the muscle on the front of the thigh (Quad) and hold for 5-10 seconds.   °• Straight Leg Raises - With your knee straight (if you were given a brace, keep it on), lift the leg to 60 degrees, hold for 3 seconds, and slowly lower the leg.  Perform this exercise against resistance later as your leg gets stronger.  °• Leg Slides: Lying on your back, slowly slide your foot toward your buttocks, bending your knee up off the floor (only go as far as is comfortable). Then slowly slide your foot back down until your leg is flat on the floor again.  °• Angel Wings: Lying on your back spread your legs to the side as far apart as you can without causing discomfort.  °• Hamstring Strength:  Lying on your back, push your heel against the floor with your leg straight by tightening up the muscles of your buttocks.  Repeat, but this time bend your knee to a comfortable angle, and push your heel against the floor.  You may put a pillow under the heel to make it more comfortable if necessary.  ° °A rehabilitation program following joint replacement surgery can speed recovery and prevent re-injury in the future due to weakened muscles. Contact your doctor or a physical therapist for more information on knee rehabilitation.  ° ° °CONSTIPATION ° °Constipation is defined medically as fewer than three stools per week and severe constipation as less than one stool per week.  Even if you have a regular bowel pattern at home, your normal regimen is likely to be disrupted due to multiple reasons following surgery.  Combination of anesthesia, postoperative narcotics, change in appetite and fluid intake all can affect your bowels.  ° °YOU MUST use at least one of the following options; they are listed in order of increasing strength to get the job done.   They are all available over the counter, and you may need to use some, POSSIBLY even all of these options:   ° °Drink plenty of fluids (prune juice may be helpful) and high fiber foods °Colace 100 mg by mouth twice a day  °Senokot for constipation as directed and as needed Dulcolax (bisacodyl), take with full glass of water  °Miralax (polyethylene glycol) once or twice a day as needed. ° °If you have tried all these things and are unable to have a bowel movement in the first 3-4 days after surgery call either your surgeon or your primary doctor.   ° °If you experience loose stools or diarrhea, hold the medications until you stool forms back up.  If your symptoms do not get better within 1 week or if they get worse,   check with your doctor.  If you experience "the worst abdominal pain ever" or develop nausea or vomiting, please contact the office immediately for further recommendations for treatment. ° ° °ITCHING:  If you experience itching with your medications, try taking only a single pain pill, or even half a pain pill at a time.  You can also use Benadryl over the counter for itching or also to help with sleep.  ° °TED HOSE STOCKINGS:  Use stockings on both legs until for at least 2 weeks or as directed by physician office. They may be removed at night for sleeping. ° °MEDICATIONS:  See your medication summary on the “After Visit Summary” that nursing will review with you.  You may have some home medications which will be placed on hold until you complete the course of blood thinner medication.  It is important for you to complete the blood thinner medication as prescribed. ° °PRECAUTIONS:  If you experience chest pain or shortness of breath - call 911 immediately for transfer to the hospital emergency department.  ° °If you develop a fever greater that 101 F, purulent drainage from wound, increased redness or drainage from wound, foul odor from the wound/dressing, or calf pain - CONTACT YOUR SURGEON.   °                                                 °FOLLOW-UP APPOINTMENTS:  If you do not already have a post-op appointment, please call the office for an appointment to be seen by your surgeon.  Guidelines for how soon to be seen are listed in your “After Visit Summary”, but are typically between 1-4 weeks after surgery. ° °OTHER INSTRUCTIONS:  ° ° ° °MAKE SURE YOU:  °• Understand these instructions.  °• Get help right away if you are not doing well or get worse.  ° ° °Thank you for letting us be a part of your medical care team.  It is a privilege we respect greatly.  We hope these instructions will help you stay on track for a fast and full recovery!  ° ° ° ° °

## 2018-09-23 NOTE — Progress Notes (Signed)
Assisted Dr. Rose with left, ultrasound guided, adductor canal block. Side rails up, monitors on throughout procedure. See vital signs in flow sheet. Tolerated Procedure well.  

## 2018-09-23 NOTE — Anesthesia Procedure Notes (Signed)
Procedure Name: MAC Date/Time: 09/23/2018 9:14 AM Performed by: Lissa Morales, CRNA Pre-anesthesia Checklist: Patient identified, Emergency Drugs available, Suction available, Patient being monitored and Timeout performed Patient Re-evaluated:Patient Re-evaluated prior to induction Oxygen Delivery Method: Simple face mask Placement Confirmation: positive ETCO2

## 2018-09-23 NOTE — Op Note (Signed)
09/23/2018  10:02 AM  PATIENT:  Julie Jefferson    PRE-OPERATIVE DIAGNOSIS:  OA LEFT KNEE  POST-OPERATIVE DIAGNOSIS:  Same  PROCEDURE:  UNICOMPARTMENTAL LEFT  KNEE  SURGEON:  Renette Butters, MD  PHYSICIAN ASSISTANT: Roxan Hockey, PA-C, he was present and scrubbed throughout the case, critical for completion in a timely fashion, and for retraction, instrumentation, and closure.   ANESTHESIA:   General  PREOPERATIVE INDICATIONS:  Julie Jefferson is a  72 y.o. female with a diagnosis of OA LEFT KNEE who failed conservative measures and elected for surgical management.    The risks benefits and alternatives were discussed with the patient preoperatively including but not limited to the risks of infection, bleeding, nerve injury, cardiopulmonary complications, blood clots, the need for revision surgery, among others, and the patient was willing to proceed.  OPERATIVE IMPLANTS: Biomet [place] mobile bearing medial compartment arthroplasty. Femoral Component: small. Tibial tray: C, Size 4 poly.   OPERATIVE FINDINGS: Endstage grade 4 medial compartment osteoarthritis. No significant changes in the lateral or patellofemoral joint  OPERATIVE PROCEDURE: The patient was brought to the operating room placed in supine position. General anesthesia was administered. IV antibiotics were given. The lower extremity was placed in the legholder and prepped and draped in usual sterile fashion.  Time out was performed.  The leg was elevated and exsanguinated and the tourniquet was inflated. Anteromedial incision was performed, and I took care to preserve the MCL. Parapatellar incision was carried out, and the osteophytes were excised, along with the medial meniscus and a small portion of the fat pad.  The extra medullary tibial cutting jig was applied, using the spoon and the 85mm G-Clamp, and I took care to protect the anterior cruciate ligament insertion and the tibial spine. The medial collateral  ligament was also protected, and I resected my proximal tibia, matching the anatomic slope.   The proximal tibial bony cut was removed in one piece, and I turned my attention to the femur.  The intramedullary femoral rod was placed using the drill, and then using the appropriate reference, I assembled the femoral jig, setting my posterior cutting block. I resected my posterior femur, and then measured my gap.   I then used the mill to match the extension gap to the flexion gap. The gaps were then measured again with the appropriate feeler gauges. Once I had balanced flexion and extension gaps, I then completed the preparation of the femur.  I milled off the anterior aspect of the distal femur to prevent impingement. I also exposed the tibia, and selected the above-named component, and then used the cutting jig to prepare the keel slot on the tibia. I also used the awl to curette out the bone to complete the preparation of the keel. The back wall was intact.  I then placed trial components, and it was found to have excellent motion, and appropriate balance.  I then cemented the components into place, cementing the tibia first, removing all excess cement, and then cementing the femur.  All loose cement was removed.  The real polyethylene insert was applied manually, and the knee was taken through functional range of motion, and found to have excellent stability and restoration of joint motion, with excellent balance.  The wounds were irrigated copiously, and the parapatellar tissue closed with Vicryl, followed by Vicryl for the subcutaneous tissue, with routine closure with Steri-Strips and sterile gauze.  The tourniquet was released, and the patient was awakened and extubated and returned to  PACU in stable and satisfactory condition. There were no complications.  POSTOPERATIVE PLAN: DVT px will consist of SCD's, mobiliation and chemical px, WBAT     Renette Butters, MD

## 2018-09-23 NOTE — Interval H&P Note (Signed)
Agree with above Julie Jefferson

## 2018-09-24 ENCOUNTER — Encounter (HOSPITAL_COMMUNITY): Payer: Self-pay | Admitting: Orthopedic Surgery

## 2018-09-24 DIAGNOSIS — M1712 Unilateral primary osteoarthritis, left knee: Secondary | ICD-10-CM | POA: Diagnosis not present

## 2018-09-24 NOTE — Progress Notes (Signed)
Physical Therapy Treatment Patient Details Name: Julie Jefferson MRN: [id number] DOB: [birth year] Today's Date: 09/24/2018    History of Present Illness 72 yo female s/p L uni-knee arthroplasty 09/23/18. Hx of R uni-knee arthroplasty 2018, breast cancer.     PT Comments    Progressing well with mobility. Reviewed/practiced exercises, gait training, and stair training. Issued HEP for pt to perform 2x/day until she begins OPPT. All education completed. Okay to d/c from PT standpoint-made RN aware.     Follow Up Recommendations  Follow surgeon's recommendation for DC plan and follow-up therapies     Equipment Recommendations  None recommended by PT    Recommendations for Other Services       Precautions / Restrictions Precautions Precautions: Knee;Fall Required Braces or Orthoses: Knee Immobilizer - Left Knee Immobilizer - Left: Discontinue once straight leg raise with < 10 degree lag(able to SLR) Restrictions Weight Bearing Restrictions: No Other Position/Activity Restrictions: WBAT    Mobility  Bed Mobility Overal bed mobility: Needs Assistance Bed Mobility: Supine to Sit;Sit to Supine     Supine to sit: Supervision;HOB elevated Sit to supine: Supervision;HOB elevated   General bed mobility comments: for safety.   Transfers Overall transfer level: Needs assistance Equipment used: Rolling walker (2 wheeled) Transfers: Sit to/from Stand Sit to Stand: Min assist        General transfer comment: VCs safety, technique, hand/LE placement. Assist to rise  Ambulation/Gait Ambulation/Gait assistance: Min guard Gait Distance (Feet): 75 Feet Assistive device: Rolling walker (2 wheeled) Gait Pattern/deviations: Step-to pattern     General Gait Details: VCs safety, sequence. Close guard for safety.    Stairs Stairs: Yes Stairs assistance: Min guard Stair Management: Step to pattern;Forwards;Two rails Number of Stairs: 2 General stair comments: up and over  portable steps. VCs safety, technique, sequence.    Wheelchair Mobility    Modified Rankin (Stroke Patients Only)       Balance Overall balance assessment: Mild deficits observed, not formally tested                                          Cognition Arousal/Alertness: Awake/alert Behavior During Therapy: WFL for tasks assessed/performed Overall Cognitive Status: Within Functional Limits for tasks assessed                                        Exercises Total Joint Exercises Ankle Circles/Pumps: AROM;Both;10 reps;Supine Quad Sets: AROM;Both;10 reps;Supine Hip ABduction/ADduction: AROM;Left;10 reps;Supine Straight Leg Raises: AROM;Left;10 reps;Supine Knee Flexion: AROM;Left;10 reps;Seated Goniometric ROM: ~5-100 degrees    General Comments        Pertinent Vitals/Pain Pain Assessment: 0-10 Pain Score: 6  Pain Location: L knee Pain Descriptors / Indicators: Sore;Discomfort;Aching Pain Intervention(s): Monitored during session;Repositioned;Ice applied    Home Living Family/patient expects to be discharged to:: Private residence Living Arrangements: Alone Available Help at Discharge: Family(going to daughter's home) Type of Home: House(daughter's home) Home Access: Stairs to enter Entrance Stairs-Rails: Right Home Layout: Able to live on main level with bedroom/bathroom Home Equipment: Walker - 2 wheels;Shower seat      Prior Function Level of Independence: Independent          PT Goals (current goals can now be found in the care plan section) Acute Rehab PT Goals Patient Stated Goal: to regain  PLOF Progress towards PT goals: Progressing toward goals    Frequency    7X/week      PT Plan Current plan remains appropriate    Co-evaluation              AM-PAC PT "6 Clicks" Daily Activity  Outcome Measure  Difficulty turning over in bed (including adjusting bedclothes, sheets and blankets)?: None Difficulty  moving from lying on back to sitting on the side of the bed? : None Difficulty sitting down on and standing up from a chair with arms (e.g., wheelchair, bedside commode, etc,.)?: A Little Help needed moving to and from a bed to chair (including a wheelchair)?: A Little Help needed walking in hospital room?: A Little Help needed climbing 3-5 steps with a railing? : A Little 6 Click Score: 20    End of Session Equipment Utilized During Treatment: Gait belt Activity Tolerance: Patient tolerated treatment well Patient left: in bed;with call bell/phone within reach;with family/visitor present   PT Visit Diagnosis: Pain;Other abnormalities of gait and mobility (R26.89);Difficulty in walking, not elsewhere classified (R26.2) Pain - Right/Left: Left Pain - part of body: Knee     Time: 3825-0539 PT Time Calculation (min) (ACUTE ONLY): 28 min  Charges:  $Gait Training: 8-22 mins $Therapeutic Exercise: 8-22 mins                        Weston Anna, PT Acute Rehabilitation Services Pager: [phone] Office: [phone]

## 2018-09-24 NOTE — Progress Notes (Signed)
    Subjective: Patient reports pain as mild to moderate.  Tolerating diet.  Urinating.  No CP, SOB.  OOB mobilizing well w/ PT.  Objective:   VITALS:   Vitals:   09/23/18 1819 09/23/18 2200 09/24/18 0100 09/24/18 0620  BP: 118/61 120/62 (!) 144/69 (!) 157/65  Pulse: 64 66 60 (!) 55  Resp: 16 16 18 20   Temp: 98.1 F (36.7 C) 98.1 F (36.7 C) 97.6 F (36.4 C) 98.3 F (36.8 C)  TempSrc: Oral Oral Oral Oral  SpO2: 98% 100% 97% 99%  Weight:      Height:       CBC Latest Ref Rng & Units 09/16/2018 12/28/2016 09/02/2016  WBC 4.0 - 10.5 K/uL 5.5 - 5.8  Hemoglobin 12.0 - 15.0 g/dL 12.1 12.5 12.3  Hematocrit 36.0 - 46.0 % 38.5 - 37.9  Platelets 150 - 400 K/uL 305 - 266   BMP Latest Ref Rng & Units 09/16/2018 09/02/2016 05/12/2013  Glucose 70 - 99 mg/dL 93 93 80  BUN 8 - 23 mg/dL 17 21(H) 15  Creatinine 0.44 - 1.00 mg/dL 0.76 0.73 0.77  Sodium 135 - 145 mmol/L 141 138 139  Potassium 3.5 - 5.1 mmol/L 4.4 4.2 3.6  Chloride 98 - 111 mmol/L 106 107 104  CO2 22 - 32 mmol/L 26 21(L) 24  Calcium 8.9 - 10.3 mg/dL 9.7 9.3 9.3   Intake/Output      10/22 0701 - 10/23 0700 10/23 0701 - 10/24 0700   P.O. 240    I.V. (mL/kg) 1877.8 (21.1)    IV Piggyback 50    Total Intake(mL/kg) 2167.8 (24.3)    Urine (mL/kg/hr) 2750    Blood 50    Total Output 2800    Net -632.2            Physical Exam: General: NAD.  Supine in bed on arrival.  Calm, conversant. Resp: No increased wob Cardio: regular rate and rhythm ABD soft Neurologically intact MSK LLE: Neurovascularly intact Sensation intact distally Intact pulses distally Dorsiflexion/Plantar flexion intact Incision: dressing C/D/I   Assessment: 1 Day Post-Op  S/P Procedure(s) (LRB): UNICOMPARTMENTAL LEFT  KNEE (Left) by Dr. Ernesta Amble. Percell Miller on 09/23/18  Principal Problem:   Primary osteoarthritis of left knee Active Problems:   Takotsubo syndrome   HTN (hypertension)   HLD (hyperlipidemia)   Hypothyroidism   GERD  (gastroesophageal reflux disease)   Primary osteoarthritis of knee   Primary osteoarthritis, status post Left Unicompartmental knee arthroplasty Doing well POD1 Eating, drinking, voiding Pain controlled Mobilizing w/ PT  Plan: Up with therapy D/C IV fluids Incentive Spirometry Elevate and Apply ice CPM, bone foam  Weight Bearing: Weight Bearing as Tolerated (WBAT)  Dressings: Maintain Mepilex.  Please apply thigh high TED hose to operative leg prior to discharge. VTE prophylaxis: Aspirin, SCDs, ambulation Dispo: Home today.   Patient's anticipated LOS is less than 2 midnights, meeting these requirements: - Lives within 1 hour of care - Has a competent adult at home to recover with post-op recover - NO history of  - Chronic pain requiring opiods  - Diabetes  - Coronary Artery Disease  - Heart failure  - Stroke  - DVT/VTE  - Cardiac arrhythmia  - Respiratory Failure/COPD  - Renal failure  - Anemia  - Advanced Liver disease   Julie Burly III, PA-C 09/24/2018, 7:57 AM

## 2018-09-24 NOTE — Discharge Summary (Signed)
Discharge Summary  Patient ID: Julie Jefferson MRN: [id number] DOB/AGE: [birth year] 72 y.o.  Admit date: 09/23/2018 Discharge date: 09/24/2018  Admission Diagnoses:  Primary osteoarthritis of left knee  Discharge Diagnoses:  Principal Problem:   Primary osteoarthritis of left knee Active Problems:   Takotsubo syndrome   HTN (hypertension)   HLD (hyperlipidemia)   Hypothyroidism   GERD (gastroesophageal reflux disease)   Primary osteoarthritis of knee   Past Medical History:  Diagnosis Date  . Aortic atherosclerosis ([place])   . Arthritis    rt knee, back, hands  . Bilateral lower extremity edema   . Breast cancer ([place]) 2005   left  . Chest pain    February, 2014  . Closed right ankle fracture 2016  . Cough    ACE cough 2012  . Ejection fraction    EF 60-65%, echo, February, 2009, (LV function normalized).  . GERD (gastroesophageal reflux disease)   . History of anemia   . History of breast cancer    left  . HLD (hyperlipidemia)    mixed  . Hypothyroidism   . Personal history of chemotherapy 2005  . Personal history of radiation therapy 2005  . Pre-syncope    Hospital, June, 2014, probably dehydration  . Takotsubo cardiomyopathy   . Takotsubo syndrome    cardiac cath 11/08. minimal coronary disease, anteroseptal akinesis with an EF 55%.  echo 2/09 EF 60-65% (LV function normalized)    Surgeries: Procedure(s): UNICOMPARTMENTAL LEFT  KNEE on 09/23/2018   Consultants (if any):   Discharged Condition: Improved  Hospital Course: Julie Jefferson is an 72 y.o. female who was admitted 09/23/2018 with a diagnosis of Primary osteoarthritis of left knee and went to the operating room on 09/23/2018 and underwent the above named procedures.    She was given perioperative antibiotics:  Anti-infectives (From admission, onward)   Start     Dose/Rate Route Frequency Ordered Stop   09/23/18 1600  clindamycin (CLEOCIN) IVPB 600 mg     600 mg 100 mL/hr over 30 Minutes  Intravenous Every 6 hours 09/23/18 1345 09/23/18 2056   09/23/18 0745  vancomycin (VANCOCIN) IVPB 1000 mg/200 mL premix     1,000 mg 200 mL/hr over 60 Minutes Intravenous On call to O.R. 09/23/18 0741 09/23/18 1020   09/23/18 0745  clindamycin (CLEOCIN) IVPB 900 mg     900 mg 100 mL/hr over 30 Minutes Intravenous On call to O.R. 09/23/18 0741 09/23/18 1046    .  She was given sequential compression devices, early ambulation, and Aspirin for DVT prophylaxis.  She benefited maximally from the hospital stay and there were no complications.    Recent vital signs:  Vitals:   09/24/18 0100 09/24/18 0620  BP: (!) 144/69 (!) 157/65  Pulse: 60 (!) 55  Resp: 18 20  Temp: 97.6 F (36.4 C) 98.3 F (36.8 C)  SpO2: 97% 99%    Recent laboratory studies:  Lab Results  Component Value Date   HGB 12.1 09/16/2018   HGB 12.5 12/28/2016   HGB 12.3 09/02/2016   Lab Results  Component Value Date   WBC 5.5 09/16/2018   PLT 305 09/16/2018   Lab Results  Component Value Date   INR 1.07 09/02/2016   Lab Results  Component Value Date   NA 141 09/16/2018   K 4.4 09/16/2018   CL 106 09/16/2018   CO2 26 09/16/2018   BUN 17 09/16/2018   CREATININE 0.76 09/16/2018   GLUCOSE 93 09/16/2018  Discharge Medications:   Allergies as of 09/24/2018      Reactions   Alphagan [brimonidine] Other (See Comments)   Dries eyes out.    Dorzolamide Hcl Other (See Comments)   Dried eyes out.   Penicillins Other (See Comments)   Has patient had a PCN reaction causing immediate rash, facial/tongue/throat swelling, SOB or lightheadedness with hypotension: Unknown childhood reaction Has patient had a PCN reaction causing severe rash involving mucus membranes or skin necrosis: No Has patient had a PCN reaction that required hospitalization No Has patient had a PCN reaction occurring within the last 10 years: No If all of the above answers are "NO", then may proceed with Cephalosporin use.   Latex Rash       Medication List    STOP taking these medications   acetaminophen 650 MG CR tablet Commonly known as:  TYLENOL Replaced by:  acetaminophen 500 MG tablet     TAKE these medications   acetaminophen 500 MG tablet Commonly known as:  TYLENOL Take 2 tablets (1,000 mg total) by mouth every 8 (eight) hours for 14 days. For Pain. Replaces:  acetaminophen 650 MG CR tablet   amLODipine 5 MG tablet Commonly known as:  NORVASC Take 5 mg by mouth daily.   aspirin EC 81 MG tablet Take 1 tablet (81 mg total) by mouth 2 (two) times daily. For DVT prophylaxis for 30 days after surgery. What changed:    when to take this  additional instructions   bimatoprost 0.01 % Soln Commonly known as:  LUMIGAN Place 1 drop into both eyes at bedtime.   carvedilol 3.125 MG tablet Commonly known as:  COREG TAKE 1 TABLET BY MOUTH TWICE A DAY What changed:  when to take this   docusate sodium 100 MG capsule Commonly known as:  COLACE Take 1 capsule (100 mg total) by mouth 2 (two) times daily. To prevent constipation while taking pain medication.   gabapentin 300 MG capsule Commonly known as:  NEURONTIN Take 1 capsule (300 mg total) by mouth 2 (two) times daily for 14 days. For 2 weeks post op for pain.   levothyroxine 125 MCG tablet Commonly known as:  SYNTHROID, LEVOTHROID Take 125 mcg by mouth daily before breakfast.   meclizine 25 MG tablet Commonly known as:  ANTIVERT Take 25 mg by mouth 3 (three) times daily as needed for dizziness.   methocarbamol 500 MG tablet Commonly known as:  ROBAXIN Take 1 tablet (500 mg total) by mouth every 8 (eight) hours as needed for muscle spasms.   omeprazole 40 MG capsule Commonly known as:  PRILOSEC Take 40 mg by mouth daily.   ondansetron 4 MG tablet Commonly known as:  ZOFRAN Take 1 tablet (4 mg total) by mouth every 8 (eight) hours as needed for nausea or vomiting.   OVER THE COUNTER MEDICATION Take 1 capsule by mouth 2 (two) times daily.  Hydro Eye Supplement   oxyCODONE 5 MG immediate release tablet Commonly known as:  Oxy IR/ROXICODONE Take 1 tablet (5 mg total) by mouth every 4 (four) hours as needed for breakthrough pain.   polyethylene glycol powder powder Commonly known as:  GLYCOLAX/MIRALAX Take 17 g by mouth daily as needed for mild constipation.   ranitidine 75 MG tablet Commonly known as:  ZANTAC Take 75 mg by mouth daily.   simvastatin 10 MG tablet Commonly known as:  ZOCOR TAKE 1 TABLET BY MOUTH EVERYDAY AT BEDTIME What changed:  See the new instructions.   timolol  0.5 % ophthalmic solution Commonly known as:  BETIMOL Place 1 drop into both eyes every morning.       Diagnostic Studies: Dg Knee Left Port  Result Date: 09/23/2018 CLINICAL DATA:  Followup medial compartment arthroplasty EXAM: PORTABLE LEFT KNEE - 1-2 VIEW COMPARISON:  None. FINDINGS: Two-view show performance of medial compartment arthroplasty on the left knee. Components appear well positioned. No radiographically detectable complication. Small amount of air and fluid in the joint as expected. IMPRESSION: Good radiographic appearance following medial compartment arthroplasty. Electronically Signed   By: Nelson Chimes M.D.   On: 09/23/2018 13:00    Disposition: Discharge disposition: 01-Home or Self Care       Discharge Instructions    Discharge patient   Complete by:  As directed    Discharge disposition:  01-Home or Self Care   Discharge patient date:  09/24/2018      Follow-up Information    Renette Butters, MD Follow up.   Specialty:  Orthopedic Surgery Contact information: [address] [place] [address] [phone]            Signed: Prudencio Burly III PA-C 09/24/2018, 8:01 AM

## 2018-09-24 NOTE — Care Management (Signed)
This CM spoke with pt and daughter at bedside for DC plan. Pt for OPPT. Daughter states she has communicated with [employer] rep and MD about home regimen for CPM. Marney Doctor RN,BSN [phone]

## 2018-09-24 NOTE — Anesthesia Postprocedure Evaluation (Signed)
Anesthesia Post Note  Patient: Gerri Lins  Procedure(s) Performed: UNICOMPARTMENTAL LEFT  KNEE (Left Knee)     Patient location during evaluation: PACU Anesthesia Type: Spinal Level of consciousness: awake and alert Pain management: pain level controlled Vital Signs Assessment: post-procedure vital signs reviewed and stable Respiratory status: spontaneous breathing, nonlabored ventilation and respiratory function stable Cardiovascular status: blood pressure returned to baseline and stable Postop Assessment: no apparent nausea or vomiting Anesthetic complications: no    Last Vitals:  Vitals:   09/23/18 2200 09/24/18 0100  BP: 120/62 (!) 144/69  Pulse: 66 60  Resp: 16 18  Temp: 36.7 C 36.4 C  SpO2: 100% 97%    Last Pain:  Vitals:   09/24/18 0100  TempSrc: Oral  PainSc: 2                  Lynda Rainwater

## 2018-09-24 NOTE — Evaluation (Signed)
Occupational Therapy Evaluation Patient Details Name: Julie Jefferson MRN: [id number] DOB: [birth year] Today's Date: 09/24/2018    History of Present Illness 72 yo female s/p L uni-knee arthroplasty 09/23/18. Hx of R uni-knee arthroplasty 2018, breast cancer.    Clinical Impression   OT education complete.    Follow Up Recommendations  No OT follow up    Equipment Recommendations  None recommended by OT    Recommendations for Other Services       Precautions / Restrictions Precautions Precautions: Fall;Knee Required Braces or Orthoses: Knee Immobilizer - Left Knee Immobilizer - Left: Discontinue once straight leg raise with < 10 degree lag Restrictions Weight Bearing Restrictions: No Other Position/Activity Restrictions: WBAT      Mobility Bed Mobility Overal bed mobility: Needs Assistance Bed Mobility: Supine to Sit     Supine to sit: HOB elevated;Supervision     General bed mobility comments: Assist for L LE. Increased time.   Transfers Overall transfer level: Needs assistance Equipment used: Rolling walker (2 wheeled) Transfers: Sit to/from [facility] Sit to Stand: Supervision Stand pivot transfers: Supervision       General transfer comment: VCs safety, technique, hand/LE placement. Assist to rise, stabilize, control descent.     Balance Overall balance assessment: Mild deficits observed, not formally tested                                         ADL either performed or assessed with clinical judgement   ADL Overall ADL's : Needs assistance/impaired Eating/Feeding: Set up;Sitting   Grooming: Standing;Supervision/safety   Upper Body Bathing: Set up;Sitting   Lower Body Bathing: Supervison/ safety;Sit to/from stand   Upper Body Dressing : Set up;Sitting   Lower Body Dressing: Min guard;Sit to/from stand;Cueing for sequencing;Cueing for safety   Toilet Transfer: Supervision/safety;RW;Comfort height toilet    Toileting- Clothing Manipulation and Hygiene: Supervision/safety;Sit to/from stand   Tub/ Shower Transfer: Minimal assistance;Tub transfer   Functional mobility during ADLs: Supervision/safety       Vision Patient Visual Report: No change from baseline              Pertinent Vitals/Pain Pain Score: 2  Pain Location: L knee Pain Descriptors / Indicators: Discomfort Pain Intervention(s): Monitored during session           Communication Communication Communication: No difficulties   Cognition Arousal/Alertness: Awake/alert Behavior During Therapy: WFL for tasks assessed/performed Overall Cognitive Status: Within Functional Limits for tasks assessed                                                Home Living Family/patient expects to be discharged to:: Private residence Living Arrangements: Alone Available Help at Discharge: Family(going to daughter's home) Type of Home: House(daughter's home) Home Access: Stairs to enter [employer] of Steps: 3 Entrance Stairs-Rails: Right Home Layout: Able to live on main level with bedroom/bathroom               Home Equipment: Walker - 2 wheels;Shower seat          Prior Functioning/Environment Level of Independence: Independent                       OT Treatment/Interventions: Self-care/ADL training;Patient/family education  OT Goals(Current goals can be found in the care plan section) Acute Rehab OT Goals Patient Stated Goal: to regain PLOF OT Goal Formulation: With patient  OT Frequency:                AM-PAC PT "6 Clicks" Daily Activity     Outcome Measure Help from another person eating meals?: None Help from another person taking care of personal grooming?: None Help from another person toileting, which includes using toliet, bedpan, or urinal?: None Help from another person bathing (including washing, rinsing, drying)?: A Little Help from another person to put  on and taking off regular upper body clothing?: None Help from another person to put on and taking off regular lower body clothing?: A Little 6 Click Score: 22   End of Session Nurse Communication: Mobility status  Activity Tolerance: Patient tolerated treatment well Patient left: in chair;with call bell/phone within reach;with family/visitor present                   Time: 1050-1110 OT Time Calculation (min): 20 min Charges:  OT General Charges $OT Visit: 1 Visit OT Evaluation $OT Eval Low Complexity: 1 Low  Kari Baars, OT Acute Rehabilitation Services [phone] Office- [phone]     [place], Edwena Felty D 09/24/2018, 11:51 AM

## 2018-11-01 ENCOUNTER — Other Ambulatory Visit: Payer: Self-pay | Admitting: Cardiovascular Disease

## 2018-11-14 ENCOUNTER — Other Ambulatory Visit: Payer: Self-pay | Admitting: Cardiovascular Disease

## 2019-05-08 ENCOUNTER — Telehealth: Payer: Self-pay | Admitting: Cardiovascular Disease

## 2019-05-08 NOTE — Telephone Encounter (Signed)
Reports chest tightness rated 3/10, still has some SOB and fatigue after treatment for bronchitis and was advised to contact her cardiologist about her symptoms by her PCP. VV set up for 06/08 3:30 pm, advised if symptoms get worse, to go to the ED for an evaluation. Verbalized understanding.   Patient verbally consented for telehealth visit with [facility] and understands that her insurance company will be billed for the encounter. Aware to have weight available. Unable to check BP or HR.

## 2019-05-08 NOTE — Telephone Encounter (Signed)
Pt was diagnosed w/ Bronchitis back in April and she's still having chest tightness, right down the center of her chest. Her PCP put her on prednisone and that helped, but PCP told her to contact Dr. Bronson Ing and see if he suggested anything. She's currently waiting to have a chest xray that was ordered by her PCP.   Please give pt a call @ [phone]

## 2019-05-11 ENCOUNTER — Telehealth (INDEPENDENT_AMBULATORY_CARE_PROVIDER_SITE_OTHER): Payer: Medicare Other | Admitting: Cardiovascular Disease

## 2019-05-11 ENCOUNTER — Encounter: Payer: Self-pay | Admitting: Cardiovascular Disease

## 2019-05-11 VITALS — Ht 65.5 in | Wt 193.0 lb

## 2019-05-11 DIAGNOSIS — R0789 Other chest pain: Secondary | ICD-10-CM

## 2019-05-11 DIAGNOSIS — I5181 Takotsubo syndrome: Secondary | ICD-10-CM

## 2019-05-11 DIAGNOSIS — I10 Essential (primary) hypertension: Secondary | ICD-10-CM

## 2019-05-11 DIAGNOSIS — E7800 Pure hypercholesterolemia, unspecified: Secondary | ICD-10-CM

## 2019-05-11 NOTE — Addendum Note (Signed)
Addended by: Barbarann Ehlers A on: 05/11/2019 04:17 PM   Modules accepted: Orders

## 2019-05-11 NOTE — Patient Instructions (Addendum)
Medication Instructions: Your physician recommends that you continue on your current medications as directed. Please refer to the Current Medication list given to you today.   Labwork:  CBC,CMET,BNP at [facility] lab  Procedures/Testing: Your physician has requested that you have an echocardiogram. Echocardiography is a painless test that uses sound waves to create images of your heart. It provides your doctor with information about the size and shape of your heart and how well your heart's chambers and valves are working. This procedure takes approximately one hour. There are no restrictions for this procedure.  Your physician has requested that you have a exercise  myoview. For further information please visit [email or web address]. Please follow instruction sheet, as given.    Follow-Up: 3 weeks with Dr.koneswaran  Any Additional Special Instructions Will Be Listed Below (If Applicable).  Get EKG done in [place] office (nurse apt)  the day you get your echo   If you need a refill on your cardiac medications before your next appointment, please call your pharmacy.

## 2019-05-11 NOTE — Progress Notes (Addendum)
Virtual Visit via Video Note   This visit type was conducted due to national recommendations for restrictions regarding the COVID-19 Pandemic (e.g. social distancing) in an effort to limit this patient's exposure and mitigate transmission in our community.  Due to her co-morbid illnesses, this patient is at least at moderate risk for complications without adequate follow up.  This format is felt to be most appropriate for this patient at this time.  All issues noted in this document were discussed and addressed.  A limited physical exam was performed with this format.  Please refer to the patient's chart for her consent to telehealth for [facility].   Date:  05/11/2019   ID:  Julie Jefferson, Julie Jefferson 1946/08/07, MRN [id number]  Patient Location: Home Provider Location: Home  PCP:  Galen Manila, MD  Cardiologist:  Kate Sable, MD  Electrophysiologist:  None   Evaluation Performed:  Follow-Up Visit  Chief Complaint:  Chest tightness  History of Present Illness:    Julie Jefferson is a 73 y.o. female with a history of syncope and Takotsubo cardiomyopathy in 2008. Her daughter is Julie Jefferson Investment banker, corporate), who works in the [employer] program.  She had a presyncopal episode in 2014 felt secondary to volume depletion. Coronary angiography in November 2008 showed minimal coronary artery disease.  Most recent echocardiogram on 05/12/13 demonstrated normal left ventricular systolic and diastolic function, EF 23-76%, and normal regional wall motion.  She was recently treated for bronchitis by her PCP and complained of some SOB, fatigue, and chest tightness.  I reviewed PCP notes.  She was treated with prednisone and Zithromax.  She described a "fullness in her chest ".  This occurred while she was working outdoors at her house.  Upon speaking with her further, she has had ongoing symptoms of shortness of breath with exertion over the past 8 weeks.  She has been presumptively treated  for bronchitis.  She has not had any blood work.  She had a chest x-ray today just prior to this telehealth visit.  She told me that since about a week ago, the chest fullness has felt "brought her "and is now radiating up to her neck.  It is made worse with exertion.  If she has been up on her feet all day she has some ankle and feet swelling.  She denies orthopnea and paroxysmal nocturnal dyspnea.  She has had some palpitations.  The patient denies fevers/chills.  I also spoke with her daughter. She has auscultated her lungs and said they were clear. She and her daughter have checked on her regularly.   Past Medical History:  Diagnosis Date  . Aortic atherosclerosis ([place])   . Arthritis    rt knee, back, hands  . Bilateral lower extremity edema   . Breast cancer ([place]) 2005   left  . Chest pain    February, 2014  . Closed right ankle fracture 2016  . Cough    ACE cough 2012  . Ejection fraction    EF 60-65%, echo, February, 2009, (LV function normalized).  . GERD (gastroesophageal reflux disease)   . History of anemia   . History of breast cancer    left  . HLD (hyperlipidemia)    mixed  . Hypothyroidism   . Personal history of chemotherapy 2005  . Personal history of radiation therapy 2005  . Pre-syncope    Hospital, June, 2014, probably dehydration  . Takotsubo cardiomyopathy   . Takotsubo syndrome  cardiac cath 11/08. minimal coronary disease, anteroseptal akinesis with an EF 55%.  echo 2/09 EF 60-65% (LV function normalized)   Past Surgical History:  Procedure Laterality Date  . BACK SURGERY    . BREAST BIOPSY  01/03/2012  . BREAST LUMPECTOMY Left 2005   left breast  . BUNIONECTOMY     left  . cholecystectomy    . COLONOSCOPY    . GANGLION CYST EXCISION     left  . PARTIAL KNEE ARTHROPLASTY Right 12/28/2016   Procedure: RIGHT UNICOMPARTMENTAL KNEE;  Surgeon: Renette Butters, MD;  Location: [facility];  Service: Orthopedics;  Laterality: Right;   . PARTIAL KNEE ARTHROPLASTY Left 09/23/2018   Procedure: UNICOMPARTMENTAL LEFT  KNEE;  Surgeon: Renette Butters, MD;  Location: [facility];  Service: Orthopedics;  Laterality: Left;  . TOTAL ABDOMINAL HYSTERECTOMY  1997  . UPPER GI ENDOSCOPY       Current Meds  Medication Sig  . amLODipine (NORVASC) 5 MG tablet Take 5 mg by mouth daily.   Marland Kitchen aspirin EC 81 MG tablet Take 1 tablet (81 mg total) by mouth 2 (two) times daily. For DVT prophylaxis for 30 days after surgery.  . bimatoprost (LUMIGAN) 0.01 % SOLN Place 1 drop into both eyes at bedtime.   . carvedilol (COREG) 3.125 MG tablet TAKE 1 TABLET BY MOUTH TWICE A DAY  . levothyroxine (SYNTHROID, LEVOTHROID) 125 MCG tablet Take 125 mcg by mouth daily before breakfast.   . meclizine (ANTIVERT) 25 MG tablet Take 25 mg by mouth 3 (three) times daily as needed for dizziness.   Marland Kitchen omeprazole (PRILOSEC) 40 MG capsule Take 40 mg by mouth daily.    Marland Kitchen OVER THE COUNTER MEDICATION Take 1 capsule by mouth 2 (two) times daily. [employer]  . polyethylene glycol powder (GLYCOLAX/MIRALAX) powder Take 17 g by mouth daily as needed for mild constipation.   . simvastatin (ZOCOR) 10 MG tablet Take 1 tablet (10 mg total) by mouth at bedtime.  . timolol (BETIMOL) 0.5 % ophthalmic solution Place 1 drop into both eyes 2 (two) times daily.      Allergies:   Alphagan [brimonidine]; Dorzolamide hcl; Penicillins; and Latex   Social History   Tobacco Use  . Smoking status: Never Smoker  . Smokeless tobacco: Never Used  . Tobacco comment: tobacco use - no   Substance Use Topics  . Alcohol use: No    Alcohol/week: 0.0 standard drinks  . Drug use: No     Family Hx: The patient's family history includes Breast cancer in her maternal aunt, mother, and sister; Cancer in her sister; Cancer (age of onset: 29) in her maternal aunt and mother; Coronary artery disease in her unknown relative; Heart disease in her father.  ROS:   Please see the history of present  illness.     All other systems reviewed and are negative.   Prior CV studies:   The following studies were reviewed today:  See above  Labs/Other Tests and Data Reviewed:    EKG:  No ECG reviewed.  Recent Labs: 09/16/2018: BUN 17; Creatinine, Ser 0.76; Hemoglobin 12.1; Platelets 305; Potassium 4.4; Sodium 141   Recent Lipid Panel Lab Results  Component Value Date/Time   CHOL  10/25/2007 12:58 AM    161        ATP III CLASSIFICATION:  <200     mg/dL   Desirable  200-239  mg/dL   Borderline High  >=240    mg/dL  High   TRIG 53 10/25/2007 12:58 AM   HDL 70 10/25/2007 12:58 AM   CHOLHDL 2.3 10/25/2007 12:58 AM   LDLCALC  10/25/2007 12:58 AM    80        Total Cholesterol/HDL:CHD Risk Coronary Heart Disease Risk Table                     Men   Women  1/2 Average Risk   3.4   3.3    Wt Readings from Last 3 Encounters:  05/11/19 193 lb (87.5 kg)  09/23/18 196 lb 9 oz (89.2 kg)  09/16/18 196 lb 9 oz (89.2 kg)     Objective:    Vital Signs:  Ht 5' 5.5" (1.664 m)   Wt 193 lb (87.5 kg)   BMI 31.63 kg/m    VITAL SIGNS:  reviewed GEN:  no acute distress EYES:  sclerae anicteric, EOMI - Extraocular Movements Intact RESPIRATORY:  normal respiratory effort, symmetric expansion MUSCULOSKELETAL:  no obvious deformities. NEURO:  alert and oriented x 3, no obvious focal deficit PSYCH:  normal affect  ASSESSMENT & PLAN:    1. Takotsubo cardiomyopathy: Subsequent normalization of left ventricular systolic function as noted above. Continue Coreg.  2. Essential HTN: Will need continued monitoring.  3. Hyperlipidemia: Continue simvastatin 10 mg. LDL 89.6 on 05/09/18.  4. Chest tightness/fullness:While she has presumptively been treated for bronchitis, ischemic heart disease will need to be ruled out.  She had a chest x-ray today and I will try to obtain a copy of the final results.  She has not had any recent blood work so I will check a CBC, comprehensive metabolic  panel, and a BNP.  I will have her come to our office for an ECG. I will order a 2-D echocardiogram with Doppler to evaluate cardiac structure, function, and regional wall motion. I will arrange for coronary CT angiography.  If the waiting list for this is too long, I will arrange for a treadmill Myoview stress test.    COVID-19 Education: The signs and symptoms of COVID-19 were discussed with the patient and how to seek care for testing (follow up with PCP or arrange E-visit).  The importance of social distancing was discussed today.  Time:   Today, I have spent 25 minutes with the patient with telehealth technology discussing the above problems.     Medication Adjustments/Labs and Tests Ordered: Current medicines are reviewed at length with the patient today.  Concerns regarding medicines are outlined above.   Tests Ordered: No orders of the defined types were placed in this encounter.   Medication Changes: No orders of the defined types were placed in this encounter.   Disposition:  Follow up in 3 week(s)  Signed, Kate Sable, MD  05/11/2019 2:44 PM    [place]

## 2019-05-14 ENCOUNTER — Ambulatory Visit: Payer: Medicare Other

## 2019-05-14 ENCOUNTER — Encounter (HOSPITAL_BASED_OUTPATIENT_CLINIC_OR_DEPARTMENT_OTHER)
Admission: RE | Admit: 2019-05-14 | Discharge: 2019-05-14 | Disposition: A | Discharge status: Home / Self Care | Payer: Medicare Other | Source: Ambulatory Visit | Attending: Cardiovascular Disease | Admitting: Cardiovascular Disease

## 2019-05-14 ENCOUNTER — Ambulatory Visit (HOSPITAL_COMMUNITY)
Admission: RE | Admit: 2019-05-14 | Discharge: 2019-05-14 | Disposition: A | Discharge status: Home / Self Care | Payer: Medicare Other | Source: Ambulatory Visit | Attending: Cardiovascular Disease | Admitting: Cardiovascular Disease

## 2019-05-14 ENCOUNTER — Encounter (HOSPITAL_COMMUNITY)
Admission: RE | Admit: 2019-05-14 | Discharge: 2019-05-14 | Disposition: A | Discharge status: Home / Self Care | Payer: Medicare Other | Source: Ambulatory Visit | Attending: Cardiovascular Disease | Admitting: Cardiovascular Disease

## 2019-05-14 ENCOUNTER — Other Ambulatory Visit: Payer: Self-pay

## 2019-05-14 ENCOUNTER — Encounter (HOSPITAL_COMMUNITY): Payer: Self-pay

## 2019-05-14 DIAGNOSIS — I10 Essential (primary) hypertension: Secondary | ICD-10-CM | POA: Diagnosis present

## 2019-05-14 DIAGNOSIS — I5181 Takotsubo syndrome: Secondary | ICD-10-CM | POA: Diagnosis present

## 2019-05-14 DIAGNOSIS — R0789 Other chest pain: Secondary | ICD-10-CM | POA: Diagnosis present

## 2019-05-14 HISTORY — DX: Essential (primary) hypertension: I10

## 2019-05-14 HISTORY — DX: Systemic involvement of connective tissue, unspecified: M35.9

## 2019-05-14 LAB — NM MYOCAR MULTI W/SPECT W/WALL MOTION / EF
LV dias vol: 67 mL (ref 46–106)
LV sys vol: 20 mL
Peak HR: 88 {beats}/min
RATE: 0.28
Rest HR: 60 {beats}/min
SDS: 3
SRS: 2
SSS: 5
TID: 0.99

## 2019-05-14 MED ORDER — SODIUM CHLORIDE 0.9% FLUSH
INTRAVENOUS | Status: AC
  Administered 2019-05-14: 10 mL via INTRAVENOUS
  Filled 2019-05-14: qty 10

## 2019-05-14 MED ORDER — TECHNETIUM TC 99M TETROFOSMIN IV KIT
10.0000 | PACK | Freq: Once | INTRAVENOUS | Status: AC | PRN
  Administered 2019-05-14: 9.8 via INTRAVENOUS

## 2019-05-14 MED ORDER — TECHNETIUM TC 99M TETROFOSMIN IV KIT
30.0000 | PACK | Freq: Once | INTRAVENOUS | Status: AC | PRN
  Administered 2019-05-14: 31 via INTRAVENOUS

## 2019-05-14 MED ORDER — REGADENOSON 0.4 MG/5ML IV SOLN
INTRAVENOUS | Status: AC
  Administered 2019-05-14: 0.4 mg via INTRAVENOUS
  Filled 2019-05-14: qty 5

## 2019-05-14 NOTE — Progress Notes (Signed)
*  PRELIMINARY RESULTS* Echocardiogram 2D Echocardiogram has been performed.  Samuel Germany 05/14/2019, 10:31 AM

## 2019-05-18 ENCOUNTER — Telehealth: Payer: Self-pay | Admitting: *Deleted

## 2019-05-18 NOTE — Telephone Encounter (Signed)
Notes recorded by Laurine Blazer, LPN on 2/87/8676 at 72:09 AM EDT  Patient notified. Copy to pmd. Follow up scheduled for end of June.   ------  STRESS TEST -   Notes recorded by Herminio Commons, MD on 05/14/2019 at 3:00 PM EDT  Low risk study for blockages. Normal pumping function.  ----------  ECHO -   Notes recorded by Herminio Commons, MD on 05/14/2019 at 1:44 PM EDT  Echo is normal.

## 2019-06-02 ENCOUNTER — Ambulatory Visit: Payer: Medicare Other | Admitting: Cardiovascular Disease

## 2019-06-23 ENCOUNTER — Ambulatory Visit: Payer: Medicare Other | Admitting: Student

## 2019-06-23 NOTE — Progress Notes (Deleted)
Cardiology Office Note    Date:  06/23/2019   ID:  Fantasia, Jinkins June 08, 1946, MRN [id number]  PCP:  Galen Manila, MD  Cardiologist: Kate Sable, MD    No chief complaint on file.   History of Present Illness:    Julie Jefferson is a 73 y.o. female with past medical history of Takosubo cardiomyopathy (occurring in 2008 with normalization of EF since), HTN, and Hypothyroidism who presents to the office today for a one-month follow-up visit.  She most recently had a telehealth visit with Dr. Bronson Ing in 05/2019 and reported dyspnea on exertion for the past 8 weeks. She had been treated for bronchitis by her PCP with minimal improvement in her symptoms. An echocardiogram and stress test were recommended for further evaluation. Her echocardiogram showed a preserved EF of 55 to 60% with no regional wall motion abnormalities and no significant valve abnormalities. Her stress test showed a small apical defect which is most consistent with soft tissue attenuation and no large ischemic territories, overall being a low risk study.   Past Medical History:  Diagnosis Date   Aortic atherosclerosis (HCC)    Arthritis    rt knee, back, hands   Bilateral lower extremity edema    Breast cancer ([place]) 2005   left   Chest pain    February, 2014   Closed right ankle fracture 2016   Collagen vascular disease ([place])    Cough    ACE cough 2012   Ejection fraction    EF 60-65%, echo, February, 2009, (LV function normalized).   GERD (gastroesophageal reflux disease)    History of anemia    History of breast cancer    left   HLD (hyperlipidemia)    mixed   Hypertension    Hypothyroidism    Personal history of chemotherapy 2005   Personal history of radiation therapy 2005   Pre-syncope    Hospital, June, 2014, probably dehydration   Takotsubo cardiomyopathy    Takotsubo syndrome    cardiac cath 11/08. minimal coronary disease, anteroseptal akinesis with an  EF 55%.  echo 2/09 EF 60-65% (LV function normalized)    Past Surgical History:  Procedure Laterality Date   BACK SURGERY     BREAST BIOPSY  01/03/2012   BREAST LUMPECTOMY Left 2005   left breast   BUNIONECTOMY     left   cholecystectomy     COLONOSCOPY     GANGLION CYST EXCISION     left   PARTIAL KNEE ARTHROPLASTY Right 12/28/2016   Procedure: RIGHT UNICOMPARTMENTAL KNEE;  Surgeon: Renette Butters, MD;  Location: [place];  Service: Orthopedics;  Laterality: Right;   PARTIAL KNEE ARTHROPLASTY Left 09/23/2018   Procedure: UNICOMPARTMENTAL LEFT  KNEE;  Surgeon: Renette Butters, MD;  Location: [facility];  Service: Orthopedics;  Laterality: Left;   TOTAL ABDOMINAL HYSTERECTOMY  1997   UPPER GI ENDOSCOPY      Current Medications: Outpatient Medications Prior to Visit  Medication Sig Dispense Refill   amLODipine (NORVASC) 5 MG tablet Take 5 mg by mouth daily.      aspirin EC 81 MG tablet Take 1 tablet (81 mg total) by mouth 2 (two) times daily. For DVT prophylaxis for 30 days after surgery. 60 tablet 0   bimatoprost (LUMIGAN) 0.01 % SOLN Place 1 drop into both eyes at bedtime.      carvedilol (COREG) 3.125 MG tablet TAKE 1 TABLET BY MOUTH TWICE A DAY 180 tablet 2  levothyroxine (SYNTHROID, LEVOTHROID) 125 MCG tablet Take 125 mcg by mouth daily before breakfast.      meclizine (ANTIVERT) 25 MG tablet Take 25 mg by mouth 3 (three) times daily as needed for dizziness.      omeprazole (PRILOSEC) 40 MG capsule Take 40 mg by mouth daily.       OVER THE COUNTER MEDICATION Take 1 capsule by mouth 2 (two) times daily. Hydro Eye Supplement     polyethylene glycol powder (GLYCOLAX/MIRALAX) powder Take 17 g by mouth daily as needed for mild constipation.      simvastatin (ZOCOR) 10 MG tablet Take 1 tablet (10 mg total) by mouth at bedtime. 90 tablet 2   timolol (BETIMOL) 0.5 % ophthalmic solution Place 1 drop into both eyes 2 (two) times daily.      No  facility-administered medications prior to visit.      Allergies:   Alphagan [brimonidine], Dorzolamide hcl, Penicillins, and Latex   Social History   Socioeconomic History   Marital status: Widowed    Spouse name: Not on file   Number of children: Not on file   Years of education: Not on file   Highest education level: Not on file  Occupational History   Not on file  Social Needs   Financial resource strain: Not on file   Food insecurity    Worry: Not on file    Inability: Not on file   Transportation needs    Medical: Not on file    Non-medical: Not on file  Tobacco Use   Smoking status: Never Smoker   Smokeless tobacco: Never Used   Tobacco comment: tobacco use - no   Substance and Sexual Activity   Alcohol use: No    Alcohol/week: 0.0 standard drinks   Drug use: No   Sexual activity: Not on file  Lifestyle   Physical activity    Days per week: Not on file    Minutes per session: Not on file   Stress: Not on file  Relationships   Social connections    Talks on phone: Not on file    Gets together: Not on file    Attends religious service: Not on file    Active member of club or organization: Not on file    Attends meetings of clubs or organizations: Not on file    Relationship status: Not on file  Other Topics Concern   Not on file  Social History Narrative   Widowed, works full time, gets regular exercise.      Family History:  The patient's ***family history includes Breast cancer in her maternal aunt, mother, and sister; Cancer in her sister; Cancer (age of onset: 38) in her maternal aunt and mother; Coronary artery disease in her unknown relative; Heart disease in her father.   Review of Systems:   Please see the history of present illness.     General:  No chills, fever, night sweats or weight changes.  Cardiovascular:  No chest pain, dyspnea on exertion, edema, orthopnea, palpitations, paroxysmal nocturnal dyspnea. Dermatological: No  rash, lesions/masses Respiratory: No cough, dyspnea Urologic: No hematuria, dysuria Abdominal:   No nausea, vomiting, diarrhea, bright red blood per rectum, melena, or hematemesis Neurologic:  No visual changes, wkns, changes in mental status. All other systems reviewed and are otherwise negative except as noted above.   Physical Exam:    VS:  There were no vitals taken for this visit.   General: Well developed, well nourished,female appearing in  no acute distress. Head: Normocephalic, atraumatic, sclera non-icteric, no xanthomas, nares are without discharge.  Neck: No carotid bruits. JVD not elevated.  Lungs: Respirations regular and unlabored, without wheezes or rales.  Heart: ***Regular rate and rhythm. No S3 or S4.  No murmur, no rubs, or gallops appreciated. Abdomen: Soft, non-tender, non-distended with normoactive bowel sounds. No hepatomegaly. No rebound/guarding. No obvious abdominal masses. Msk:  Strength and tone appear normal for age. No joint deformities or effusions. Extremities: No clubbing or cyanosis. No edema.  Distal pedal pulses are 2+ bilaterally. Neuro: Alert and oriented X 3. Moves all extremities spontaneously. No focal deficits noted. Psych:  Responds to questions appropriately with a normal affect. Skin: No rashes or lesions noted  Wt Readings from Last 3 Encounters:  05/11/19 193 lb (87.5 kg)  09/23/18 196 lb 9 oz (89.2 kg)  09/16/18 196 lb 9 oz (89.2 kg)        Studies/Labs Reviewed:   EKG:  EKG is*** ordered today.  The ekg ordered today demonstrates ***  Recent Labs: 09/16/2018: BUN 17; Creatinine, Ser 0.76; Hemoglobin 12.1; Platelets 305; Potassium 4.4; Sodium 141   Lipid Panel    Component Value Date/Time   CHOL  10/25/2007 0058    161        ATP III CLASSIFICATION:  <200     mg/dL   Desirable  200-239  mg/dL   Borderline High  >=240    mg/dL   High   TRIG 53 10/25/2007 0058   HDL 70 10/25/2007 0058   CHOLHDL 2.3 10/25/2007 0058   VLDL  11 10/25/2007 0058   LDLCALC  10/25/2007 0058    80        Total Cholesterol/HDL:CHD Risk Coronary Heart Disease Risk Table                     Men   Women  1/2 Average Risk   3.4   3.3    Additional studies/ records that were reviewed today include:   NST: 05/14/2019  No diagnostic ST segment changes to indicate ischemia.  Small, mild intensity, partially reversible apical anteroseptal defect that is most consistent with variable soft tissue attenuation. No large ischemic territories noted.  This is a low risk study.  Nuclear stress EF: 71%.  Echocardiogram: 05/14/2019 IMPRESSIONS    1. The left ventricle has normal systolic function, with an ejection fraction of 55-60%. The cavity size was normal. Left ventricular diastolic parameters were normal. No evidence of left ventricular regional wall motion abnormalities.  2. The right ventricle has normal systolic function. The cavity was normal. There is no increase in right ventricular wall thickness. Right ventricular systolic pressure could not be assessed.  3. The aortic valve is tricuspid. Mild aortic annular calcification noted.  4. The mitral valve is grossly normal.  5. The tricuspid valve is grossly normal.  6. The aortic root is normal in size and structure.  Assessment:    No diagnosis found.   Plan:   In order of problems listed above:  1. ***    Medication Adjustments/Labs and Tests Ordered: Current medicines are reviewed at length with the patient today.  Concerns regarding medicines are outlined above.  Medication changes, Labs and Tests ordered today are listed in the Patient Instructions below. There are no Patient Instructions on file for this visit.   Signed, Erma Heritage, PA-C  06/23/2019 7:23 AM    [facility] [address],  [place] [address] Phone: [phone] Fax: [phone]

## 2019-07-08 ENCOUNTER — Ambulatory Visit (INDEPENDENT_AMBULATORY_CARE_PROVIDER_SITE_OTHER): Payer: Medicare Other | Admitting: Cardiovascular Disease

## 2019-07-08 ENCOUNTER — Encounter: Payer: Self-pay | Admitting: Cardiovascular Disease

## 2019-07-08 ENCOUNTER — Other Ambulatory Visit: Payer: Self-pay

## 2019-07-08 VITALS — BP 134/78 | Temp 97.7°F | Ht 65.0 in | Wt 192.0 lb

## 2019-07-08 DIAGNOSIS — I5181 Takotsubo syndrome: Secondary | ICD-10-CM

## 2019-07-08 DIAGNOSIS — R0789 Other chest pain: Secondary | ICD-10-CM | POA: Diagnosis not present

## 2019-07-08 DIAGNOSIS — I10 Essential (primary) hypertension: Secondary | ICD-10-CM

## 2019-07-08 DIAGNOSIS — E7800 Pure hypercholesterolemia, unspecified: Secondary | ICD-10-CM | POA: Diagnosis not present

## 2019-07-08 DIAGNOSIS — K219 Gastro-esophageal reflux disease without esophagitis: Secondary | ICD-10-CM

## 2019-07-08 MED ORDER — PANTOPRAZOLE SODIUM 40 MG PO TBEC: 40.0000 mg | DELAYED_RELEASE_TABLET | Freq: Every day | ORAL | 11 refills | Status: DC

## 2019-07-08 NOTE — Progress Notes (Signed)
SUBJECTIVE: The patient presents for follow-up after undergoing cardiac testing for chest tightness.  Nuclear stress test on 05/14/2019 was low risk, EF 71%.  There was a small apical anteroseptal defect that appeared to be most consistent with variable soft tissue attenuation.  There were no large ischemic territories.  Echocardiogram demonstrated normal left ventricular systolic and diastolic function, LVEF 55 to 60%.  There was no significant valvular pathology.  Since I last spoke with her she has lost 10 pounds by her scale.  She has modified her diet.  She also bought an additional over-the-counter antacid supplement to take with her omeprazole 40 mg daily.  She is feeling much better and she said her energy levels have improved.    Review of Systems: As per "subjective", otherwise negative.  Allergies  Allergen Reactions   Alphagan [Brimonidine] Other (See Comments)    Dries eyes out.    Dorzolamide Hcl Other (See Comments)    Dried eyes out.   Penicillins Other (See Comments)    Has patient had a PCN reaction causing immediate rash, facial/tongue/throat swelling, SOB or lightheadedness with hypotension: Unknown childhood reaction Has patient had a PCN reaction causing severe rash involving mucus membranes or skin necrosis: No Has patient had a PCN reaction that required hospitalization No Has patient had a PCN reaction occurring within the last 10 years: No If all of the above answers are "NO", then may proceed with Cephalosporin use.    Latex Rash    Current Outpatient Medications  Medication Sig Dispense Refill   amLODipine (NORVASC) 5 MG tablet Take 5 mg by mouth daily.      aspirin EC 81 MG tablet Take 1 tablet (81 mg total) by mouth 2 (two) times daily. For DVT prophylaxis for 30 days after surgery. 60 tablet 0   bimatoprost (LUMIGAN) 0.01 % SOLN Place 1 drop into both eyes at bedtime.      carvedilol (COREG) 3.125 MG tablet TAKE 1 TABLET BY MOUTH  TWICE A DAY 180 tablet 2   levothyroxine (SYNTHROID, LEVOTHROID) 125 MCG tablet Take 125 mcg by mouth daily before breakfast.      meclizine (ANTIVERT) 25 MG tablet Take 25 mg by mouth 3 (three) times daily as needed for dizziness.      omeprazole (PRILOSEC) 40 MG capsule Take 40 mg by mouth daily.       OVER THE COUNTER MEDICATION Take 1 capsule by mouth 2 (two) times daily. Hydro Eye Supplement     polyethylene glycol powder (GLYCOLAX/MIRALAX) powder Take 17 g by mouth daily as needed for mild constipation.      simvastatin (ZOCOR) 10 MG tablet Take 1 tablet (10 mg total) by mouth at bedtime. 90 tablet 2   timolol (BETIMOL) 0.5 % ophthalmic solution Place 1 drop into both eyes 2 (two) times daily.      No current facility-administered medications for this visit.     Past Medical History:  Diagnosis Date   Aortic atherosclerosis (HCC)    Arthritis    rt knee, back, hands   Bilateral lower extremity edema    Breast cancer ([place]) 2005   left   Chest pain    February, 2014   Closed right ankle fracture 2016   Collagen vascular disease ([place])    Cough    ACE cough 2012   Ejection fraction    EF 60-65%, echo, February, 2009, (LV function normalized).   GERD (gastroesophageal reflux disease)    History of  anemia    History of breast cancer    left   HLD (hyperlipidemia)    mixed   Hypertension    Hypothyroidism    Personal history of chemotherapy 2005   Personal history of radiation therapy 2005   Pre-syncope    Hospital, June, 2014, probably dehydration   Takotsubo cardiomyopathy    Takotsubo syndrome    cardiac cath 11/08. minimal coronary disease, anteroseptal akinesis with an EF 55%.  echo 2/09 EF 60-65% (LV function normalized)    Past Surgical History:  Procedure Laterality Date   BACK SURGERY     BREAST BIOPSY  01/03/2012   BREAST LUMPECTOMY Left 2005   left breast   BUNIONECTOMY     left   cholecystectomy     COLONOSCOPY      GANGLION CYST EXCISION     left   PARTIAL KNEE ARTHROPLASTY Right 12/28/2016   Procedure: RIGHT UNICOMPARTMENTAL KNEE;  Surgeon: Renette Butters, MD;  Location: [place];  Service: Orthopedics;  Laterality: Right;   PARTIAL KNEE ARTHROPLASTY Left 09/23/2018   Procedure: UNICOMPARTMENTAL LEFT  KNEE;  Surgeon: Renette Butters, MD;  Location: [facility];  Service: Orthopedics;  Laterality: Left;   TOTAL ABDOMINAL HYSTERECTOMY  1997   UPPER GI ENDOSCOPY      Social History   Socioeconomic History   Marital status: Widowed    Spouse name: Not on file   Number of children: Not on file   Years of education: Not on file   Highest education level: Not on file  Occupational History   Not on file  Social Needs   Financial resource strain: Not on file   Food insecurity    Worry: Not on file    Inability: Not on file   Transportation needs    Medical: Not on file    Non-medical: Not on file  Tobacco Use   Smoking status: Never Smoker   Smokeless tobacco: Never Used   Tobacco comment: tobacco use - no   Substance and Sexual Activity   Alcohol use: No    Alcohol/week: 0.0 standard drinks   Drug use: No   Sexual activity: Not on file  Lifestyle   Physical activity    Days per week: Not on file    Minutes per session: Not on file   Stress: Not on file  Relationships   Social connections    Talks on phone: Not on file    Gets together: Not on file    Attends religious service: Not on file    Active member of club or organization: Not on file    Attends meetings of clubs or organizations: Not on file    Relationship status: Not on file   Intimate partner violence    Fear of current or ex partner: Not on file    Emotionally abused: Not on file    Physically abused: Not on file    Forced sexual activity: Not on file  Other Topics Concern   Not on file  Social History Narrative   Widowed, works full time, gets regular exercise.       Vitals:   07/08/19 1348  BP: 134/78  Temp: 97.7 F (36.5 C)  SpO2: 98%  Weight: 192 lb (87.1 kg)  Height: 5\' 5"  (1.651 m)    Wt Readings from Last 3 Encounters:  07/08/19 192 lb (87.1 kg)  05/11/19 193 lb (87.5 kg)  09/23/18 196 lb 9 oz (89.2 kg)  PHYSICAL EXAM General: NAD HEENT: Normal. Neck: No JVD, no thyromegaly. Lungs: Clear to auscultation bilaterally with normal respiratory effort. CV: Regular rate and rhythm, normal S1/S2, no S3/S4, no murmur. No pretibial or periankle edema.     Abdomen: Soft, no distention.  Neurologic: Alert and oriented.  Psych: Normal affect. Skin: Normal. Musculoskeletal: No gross deformities.    ECG: Reviewed above under Subjective   Labs:    Lipids: Lab Results  Component Value Date/Time   [facility]  10/25/2007 12:58 AM    80        Total Cholesterol/HDL:CHD Risk Coronary Heart Disease Risk Table                     Men   Women  1/2 Average Risk   3.4   3.3   CHOL  10/25/2007 12:58 AM    161        ATP III CLASSIFICATION:  <200     mg/dL   Desirable  200-239  mg/dL   Borderline High  >=240    mg/dL   High   TRIG 53 10/25/2007 12:58 AM   HDL 70 10/25/2007 12:58 AM       ASSESSMENT AND PLAN: 1. Takotsubo cardiomyopathy: Subsequent normalization of left ventricular systolic function as noted above. Continue Coreg.  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  3. Hyperlipidemia: Continue simvastatin 10 mg. LDL 89.6 on 05/09/18.  4. Chest tightness/fullness:This has improved with weight loss and dietary modification.  Nuclear stress test was low risk as detailed above.  Cardiac function is normal.  No further cardiac testing is indicated at this time.  5.  GERD: She currently takes omeprazole 40 mg daily and an over-the-counter antacid supplement.  I will switch omeprazole to Protonix 40 mg daily.    Disposition: Follow up 6 months   Kate Sable, M.D., F.A.C.C.

## 2019-07-08 NOTE — Patient Instructions (Addendum)
Medication Instructions:  Your physician has recommended you make the following change in your medication:  Stop Omeprazole  Start Protonix   If you need a refill on your cardiac medications before your next appointment, please call your pharmacy.   Lab work: NONE   If you have labs (blood work) drawn today and your tests are completely normal, you will receive your results only by: Marland Kitchen MyChart Message (if you have MyChart) OR . A paper copy in the mail If you have any lab test that is abnormal or we need to change your treatment, we will call you to review the results.  Testing/Procedures: NONE  Follow-Up: At [facility], you and your health needs are our priority.  As part of our continuing mission to provide you with exceptional heart care, we have created designated Provider Care Teams.  These Care Teams include your primary Cardiologist (physician) and Advanced Practice Providers (APPs -  Physician Assistants and Nurse Practitioners) who all work together to provide you with the care you need, when you need it. You will need a follow up appointment in 6 months.  Please call our office 2 months in advance to schedule this appointment.  You may see Kate Sable, MD or one of the following Advanced Practice Providers on your designated Care Team:   Bernerd Pho, PA-C [facility]) . Ermalinda Barrios, PA-C ([place])  Any Other Special Instructions Will Be Listed Below (If Applicable). Thank you for choosing [facility]!

## 2019-07-27 ENCOUNTER — Telehealth: Payer: Self-pay | Admitting: *Deleted

## 2019-07-27 MED ORDER — PANTOPRAZOLE SODIUM 40 MG PO TBEC: 40.0000 mg | DELAYED_RELEASE_TABLET | Freq: Two times a day (BID) | ORAL | 6 refills | Status: DC

## 2019-07-27 NOTE — Telephone Encounter (Signed)
Increase to 40 mg bid.

## 2019-07-27 NOTE — Telephone Encounter (Signed)
Pt says since starting pantoprazole acid reflux has gotten worse - is taking tums and pepcid several times daily

## 2019-07-27 NOTE — Telephone Encounter (Signed)
Pt voiced understanding - updated medication list 

## 2019-08-11 ENCOUNTER — Other Ambulatory Visit: Payer: Self-pay | Admitting: *Deleted

## 2019-08-11 MED ORDER — SIMVASTATIN 10 MG PO TABS: 10.0000 mg | ORAL_TABLET | Freq: Every day | ORAL | 1 refills | Status: DC

## 2019-08-11 MED ORDER — CARVEDILOL 3.125 MG PO TABS: 3.1250 mg | ORAL_TABLET | Freq: Two times a day (BID) | ORAL | 1 refills | Status: DC

## 2019-08-17 ENCOUNTER — Other Ambulatory Visit: Payer: Self-pay

## 2019-08-17 ENCOUNTER — Other Ambulatory Visit (HOSPITAL_COMMUNITY)
Admission: RE | Admit: 2019-08-17 | Discharge: 2019-08-17 | Disposition: A | Discharge status: Home / Self Care | Payer: Medicare Other | Source: Ambulatory Visit | Attending: Obstetrics and Gynecology | Admitting: Obstetrics and Gynecology

## 2019-08-17 ENCOUNTER — Encounter (HOSPITAL_COMMUNITY)
Admission: RE | Admit: 2019-08-17 | Discharge: 2019-08-17 | Disposition: A | Discharge status: Home / Self Care | Payer: Medicare Other | Source: Ambulatory Visit | Attending: Obstetrics and Gynecology | Admitting: Obstetrics and Gynecology

## 2019-08-17 ENCOUNTER — Encounter (HOSPITAL_BASED_OUTPATIENT_CLINIC_OR_DEPARTMENT_OTHER): Payer: Self-pay

## 2019-08-17 DIAGNOSIS — K219 Gastro-esophageal reflux disease without esophagitis: Secondary | ICD-10-CM | POA: Insufficient documentation

## 2019-08-17 DIAGNOSIS — Z01812 Encounter for preprocedural laboratory examination: Secondary | ICD-10-CM | POA: Insufficient documentation

## 2019-08-17 DIAGNOSIS — E785 Hyperlipidemia, unspecified: Secondary | ICD-10-CM | POA: Insufficient documentation

## 2019-08-17 DIAGNOSIS — N8189 Other female genital prolapse: Secondary | ICD-10-CM | POA: Insufficient documentation

## 2019-08-17 DIAGNOSIS — Z20828 Contact with and (suspected) exposure to other viral communicable diseases: Secondary | ICD-10-CM | POA: Insufficient documentation

## 2019-08-17 DIAGNOSIS — Z79890 Hormone replacement therapy (postmenopausal): Secondary | ICD-10-CM | POA: Insufficient documentation

## 2019-08-17 DIAGNOSIS — I10 Essential (primary) hypertension: Secondary | ICD-10-CM | POA: Insufficient documentation

## 2019-08-17 DIAGNOSIS — Z853 Personal history of malignant neoplasm of breast: Secondary | ICD-10-CM | POA: Insufficient documentation

## 2019-08-17 DIAGNOSIS — Z923 Personal history of irradiation: Secondary | ICD-10-CM | POA: Insufficient documentation

## 2019-08-17 DIAGNOSIS — E039 Hypothyroidism, unspecified: Secondary | ICD-10-CM | POA: Insufficient documentation

## 2019-08-17 DIAGNOSIS — Z79899 Other long term (current) drug therapy: Secondary | ICD-10-CM | POA: Insufficient documentation

## 2019-08-17 DIAGNOSIS — Z9221 Personal history of antineoplastic chemotherapy: Secondary | ICD-10-CM | POA: Insufficient documentation

## 2019-08-17 LAB — COMPREHENSIVE METABOLIC PANEL
ALT: 30 U/L (ref 0–44)
AST: 30 U/L (ref 15–41)
Albumin: 4.3 g/dL (ref 3.5–5.0)
Alkaline Phosphatase: 70 U/L (ref 38–126)
Anion gap: 10 (ref 5–15)
BUN: 17 mg/dL (ref 8–23)
CO2: 24 mmol/L (ref 22–32)
Calcium: 9.5 mg/dL (ref 8.9–10.3)
Chloride: 105 mmol/L (ref 98–111)
Creatinine, Ser: 0.74 mg/dL (ref 0.44–1.00)
GFR calc Af Amer: >60 mL/min (ref >60)
GFR calc non Af Amer: >60 mL/min (ref >60)
Glucose, Bld: 95 mg/dL (ref 70–99)
Potassium: 4.6 mmol/L (ref 3.5–5.1)
Sodium: 139 mmol/L (ref 135–145)
Total Bilirubin: 0.6 mg/dL (ref 0.3–1.2)
Total Protein: 8.2 g/dL — ABNORMAL HIGH (ref 6.5–8.1)

## 2019-08-17 LAB — CBC
HCT: 41 % (ref 36.0–46.0)
Hemoglobin: 13 g/dL (ref 12.0–15.0)
MCH: 28.5 pg (ref 26.0–34.0)
MCHC: 31.7 g/dL (ref 30.0–36.0)
MCV: 89.9 fL (ref 80.0–100.0)
Platelets: 308 10*3/uL (ref 150–400)
RBC: 4.56 MIL/uL (ref 3.87–5.11)
RDW: 13.5 % (ref 11.5–15.5)
WBC: 5.6 10*3/uL (ref 4.0–10.5)
nRBC: 0 % (ref 0.0–0.2)

## 2019-08-17 LAB — ABO/RH: ABO/RH(D): O POS

## 2019-08-17 NOTE — Progress Notes (Signed)
Spoke with: Julie Jefferson NPO:  After Midnight, no gum, candy, or mints   Arrival time:0530 AM Labs: (CBC, CMP, T&S 08/17/2019 in epic/chart, EKG 09/11/2018 epic/chart, AM medications: Amlodipine, Carvedilol, Levothyroxine, Pantoprazole, May use eye drops Pre op orders: Yes Ride home:  Delrae Rend (daugher) [phone]

## 2019-08-17 NOTE — Progress Notes (Signed)
PCP - Dr. Geradine Girt, Last office visit D. Alford Highland FNP 08/11/2019 Cardiologist - Dr. Court Joy last office visit note chart/epic 07/08/2019  Chest x-ray - N/A EKG - 09/11/2018 epic/chart Stress Test - 05/14/2019 epic/chart ECHO - 05/14/2019 epic/chart Cardiac Cath - N/A  Sleep Study - N/A CPAP - N/A Fasting Blood Sugar - N/A Checks Blood Sugar __N/A___ times a day  Blood Thinner Instructions: N/A Aspirin Instructions: Pt stated surgeon said she could continue taking her ASA 81 mg   Last Dose:N/A  Anesthesia review: Takotsubo cardiomyopathy, widow maker per pt 2008  Patient denies shortness of breath, fever, cough and chest pain at PAT appointment   Patient verbalized understanding of instructions that were given to them at the PAT appointment. Patient was also instructed that they will need to review over the PAT instructions again at home before surgery.

## 2019-08-17 NOTE — Progress Notes (Signed)
SPOKE W/  Tandra     SCREENING SYMPTOMS OF COVID 19:   COUGH--NO  RUNNY NOSE--- NO  SORE THROAT---NO  NASAL CONGESTION----NO  SNEEZING----NO  SHORTNESS OF BREATH---NO  DIFFICULTY BREATHING---NO  TEMP >100.0 -----NO  UNEXPLAINED BODY ACHES------NO  CHILLS -------- NO  HEADACHES ---------NO  LOSS OF SMELL/ TASTE --------NO    HAVE YOU OR ANY FAMILY MEMBER TRAVELLED PAST 14 DAYS OUT OF THE   COUNTY---Lives in [facility] STATE----Lives in [place] COUNTRY----NO  HAVE YOU OR ANY FAMILY MEMBER BEEN EXPOSED TO ANYONE WITH COVID 19? NO

## 2019-08-18 LAB — NOVEL CORONAVIRUS, NAA (HOSP ORDER, SEND-OUT TO REF LAB; TAT 18-24 HRS): SARS-CoV-2, NAA: NOT DETECTED

## 2019-08-19 MED ORDER — CLINDAMYCIN PHOSPHATE 900 MG/50ML IV SOLN
900.0000 mg | INTRAVENOUS | Status: AC
  Administered 2019-08-20: 07:00:00 900 mg via INTRAVENOUS
  Filled 2019-08-19: qty 50

## 2019-08-19 MED ORDER — GENTAMICIN SULFATE 40 MG/ML IJ SOLN
440.0000 mg | INTRAVENOUS | Status: AC
  Administered 2019-08-20: 08:00:00 440 mg via INTRAVENOUS
  Filled 2019-08-19 (×2): qty 11

## 2019-08-19 NOTE — H&P (Signed)
Julie Jefferson is an 73 y.o. female. She has symptomatic pelvic prolapse. She failed a trial of pessary. After careful consideration of options she wants surgical repair.  Pertinent Gynecological History: Menses: post-menopausal Bleeding: N/A Contraception: none DES exposure: denies Blood transfusions: none Sexually transmitted diseases: no past history Previous GYN Procedures: hysterectomy  Last mammogram:  Date Last pap: normal Date:  OB History: G2, P2   Menstrual History: Menarche age: unknown No LMP recorded. Patient has had a hysterectomy.    Past Medical History:  Diagnosis Date  . Aortic atherosclerosis ([place])   . Arthritis    rt knee, back, hands  . Bilateral lower extremity edema   . Breast cancer ([place]) 2005   left  . Chest pain    February, 2014  . Closed right ankle fracture 2016  . Collagen vascular disease ([place])   . Cough    ACE cough 2012  . Ejection fraction    EF 60-65%, echo, February, 2009, (LV function normalized).  . GERD (gastroesophageal reflux disease)   . Glaucoma   . History of anemia    age 67's  . History of breast cancer    left  . HLD (hyperlipidemia)    mixed  . Hypothyroidism   . Pelvic prolapse   . Personal history of chemotherapy 2005  . Personal history of radiation therapy 2005  . Pre-syncope    Hospital, June, 2014, probably dehydration  . Takotsubo cardiomyopathy 2008   cardiac cath 11/08. minimal coronary disease, anteroseptal akinesis with an EF 55%.  echo 2/09 EF 60-65% (LV function normalized)  . Wears glasses     Past Surgical History:  Procedure Laterality Date  . BACK SURGERY    . BREAST BIOPSY  01/03/2012  . BREAST LUMPECTOMY Left 2005   left breast  . BUNIONECTOMY     left  . CHOLECYSTECTOMY    . COLONOSCOPY    . GANGLION CYST EXCISION     left  . PARTIAL KNEE ARTHROPLASTY Right 12/28/2016   Procedure: RIGHT UNICOMPARTMENTAL KNEE;  Surgeon: Renette Butters, MD;  Location: [facility];   Service: Orthopedics;  Laterality: Right;  . PARTIAL KNEE ARTHROPLASTY Left 09/23/2018   Procedure: UNICOMPARTMENTAL LEFT  KNEE;  Surgeon: Renette Butters, MD;  Location: [facility];  Service: Orthopedics;  Laterality: Left;  . TOTAL ABDOMINAL HYSTERECTOMY  1997  . UPPER GI ENDOSCOPY      Family History  Problem Relation Age of Onset  . Cancer Mother 43       breast  . Breast cancer Mother   . Heart disease Father   . Coronary artery disease Other        family hx  . Cancer Sister        LCIS 47  . Breast cancer Sister   . Cancer Maternal Aunt 10       breast  . Breast cancer Maternal Aunt     Social History:  reports that she has never smoked. She has never used smokeless tobacco. She reports that she does not drink alcohol or use drugs.  Allergies:  Allergies  Allergen Reactions  . Alphagan [Brimonidine] Other (See Comments)    Dries eyes out.   . Dorzolamide Hcl Other (See Comments)    Dried eyes out.  Marland Kitchen Penicillins Other (See Comments)    Has patient had a PCN reaction causing immediate rash, facial/tongue/throat swelling, SOB or lightheadedness with hypotension: Unknown childhood reaction Has patient had a PCN reaction causing  severe rash involving mucus membranes or skin necrosis: No Has patient had a PCN reaction that required hospitalization No Has patient had a PCN reaction occurring within the last 10 years: No If all of the above answers are "NO", then may proceed with Cephalosporin use.   . Latex Rash    No medications prior to admission.    Review of Systems  Constitutional: Negative for fever.    Height 5' 5.5" (1.664 m), weight 86.2 kg. Physical Exam  Cardiovascular: Normal rate.  Respiratory: Effort normal.  GI: Soft.  Genitourinary:    Genitourinary Comments: First degree cystocele and rectocele and prolapse of vaginal apex     No results found for this or any previous visit (from the past 82 hour(s)).  No results  found.  Assessment/Plan: 73 yo S/P hysterectomy with symptomatic pelvic prolapse. We D/W A&P repair with possible SSLS D/W risks including infection, organ damage, bleeding/transfusion-HIV/Hep, DVT/PE, pneumonia, fistula, pain, return to OR, recurrence of prolapse. She states she understands and agrees.  Julie Jefferson 08/19/2019, 6:08 PM

## 2019-08-19 NOTE — Progress Notes (Signed)
Received call from [facility], or scheduler for dr Gaetano Net, via phone that she received verbal ok to surgery from dr Tressie Stalker, pt's doctor, and also heather faxed written clearance.  Received fax and placed with chart.  Reviewed chart w/ anesthesia, Konrad Felix PA, with clearnace,  Stated ok to proceed.

## 2019-08-20 ENCOUNTER — Other Ambulatory Visit: Payer: Self-pay

## 2019-08-20 ENCOUNTER — Encounter (HOSPITAL_BASED_OUTPATIENT_CLINIC_OR_DEPARTMENT_OTHER)
Admission: RE | Disposition: A | Discharge status: Home / Self Care | Payer: Self-pay | Source: Ambulatory Visit | Attending: Obstetrics and Gynecology

## 2019-08-20 ENCOUNTER — Observation Stay (HOSPITAL_BASED_OUTPATIENT_CLINIC_OR_DEPARTMENT_OTHER): Payer: Medicare Other | Admitting: Anesthesiology

## 2019-08-20 ENCOUNTER — Encounter (HOSPITAL_BASED_OUTPATIENT_CLINIC_OR_DEPARTMENT_OTHER): Payer: Self-pay

## 2019-08-20 ENCOUNTER — Observation Stay (HOSPITAL_BASED_OUTPATIENT_CLINIC_OR_DEPARTMENT_OTHER)
Admission: RE | Admit: 2019-08-20 | Discharge: 2019-08-21 | Disposition: A | Discharge status: Home / Self Care | Payer: Medicare Other | Source: Ambulatory Visit | Attending: Obstetrics and Gynecology | Admitting: Obstetrics and Gynecology

## 2019-08-20 DIAGNOSIS — I5181 Takotsubo syndrome: Secondary | ICD-10-CM | POA: Diagnosis not present

## 2019-08-20 DIAGNOSIS — Z923 Personal history of irradiation: Secondary | ICD-10-CM | POA: Insufficient documentation

## 2019-08-20 DIAGNOSIS — Z96653 Presence of artificial knee joint, bilateral: Secondary | ICD-10-CM | POA: Insufficient documentation

## 2019-08-20 DIAGNOSIS — Z853 Personal history of malignant neoplasm of breast: Secondary | ICD-10-CM | POA: Diagnosis not present

## 2019-08-20 DIAGNOSIS — K219 Gastro-esophageal reflux disease without esophagitis: Secondary | ICD-10-CM | POA: Diagnosis not present

## 2019-08-20 DIAGNOSIS — Z91040 Latex allergy status: Secondary | ICD-10-CM | POA: Insufficient documentation

## 2019-08-20 DIAGNOSIS — M19042 Primary osteoarthritis, left hand: Secondary | ICD-10-CM | POA: Diagnosis not present

## 2019-08-20 DIAGNOSIS — M359 Systemic involvement of connective tissue, unspecified: Secondary | ICD-10-CM | POA: Insufficient documentation

## 2019-08-20 DIAGNOSIS — N816 Rectocele: Secondary | ICD-10-CM | POA: Insufficient documentation

## 2019-08-20 DIAGNOSIS — Z79899 Other long term (current) drug therapy: Secondary | ICD-10-CM | POA: Diagnosis not present

## 2019-08-20 DIAGNOSIS — M4690 Unspecified inflammatory spondylopathy, site unspecified: Secondary | ICD-10-CM | POA: Insufficient documentation

## 2019-08-20 DIAGNOSIS — Z888 Allergy status to other drugs, medicaments and biological substances status: Secondary | ICD-10-CM | POA: Insufficient documentation

## 2019-08-20 DIAGNOSIS — Z7982 Long term (current) use of aspirin: Secondary | ICD-10-CM | POA: Diagnosis not present

## 2019-08-20 DIAGNOSIS — Z8249 Family history of ischemic heart disease and other diseases of the circulatory system: Secondary | ICD-10-CM | POA: Diagnosis not present

## 2019-08-20 DIAGNOSIS — N8111 Cystocele, midline: Principal | ICD-10-CM | POA: Insufficient documentation

## 2019-08-20 DIAGNOSIS — I700 Atherosclerosis of aorta: Secondary | ICD-10-CM | POA: Diagnosis not present

## 2019-08-20 DIAGNOSIS — Z9049 Acquired absence of other specified parts of digestive tract: Secondary | ICD-10-CM | POA: Insufficient documentation

## 2019-08-20 DIAGNOSIS — N814 Uterovaginal prolapse, unspecified: Secondary | ICD-10-CM

## 2019-08-20 DIAGNOSIS — Z90710 Acquired absence of both cervix and uterus: Secondary | ICD-10-CM | POA: Insufficient documentation

## 2019-08-20 DIAGNOSIS — E785 Hyperlipidemia, unspecified: Secondary | ICD-10-CM | POA: Diagnosis not present

## 2019-08-20 DIAGNOSIS — M19041 Primary osteoarthritis, right hand: Secondary | ICD-10-CM | POA: Diagnosis not present

## 2019-08-20 DIAGNOSIS — Z9221 Personal history of antineoplastic chemotherapy: Secondary | ICD-10-CM | POA: Diagnosis not present

## 2019-08-20 DIAGNOSIS — E039 Hypothyroidism, unspecified: Secondary | ICD-10-CM | POA: Diagnosis not present

## 2019-08-20 DIAGNOSIS — H409 Unspecified glaucoma: Secondary | ICD-10-CM | POA: Diagnosis not present

## 2019-08-20 DIAGNOSIS — Z880 Allergy status to penicillin: Secondary | ICD-10-CM | POA: Insufficient documentation

## 2019-08-20 DIAGNOSIS — Z803 Family history of malignant neoplasm of breast: Secondary | ICD-10-CM | POA: Diagnosis not present

## 2019-08-20 DIAGNOSIS — I10 Essential (primary) hypertension: Secondary | ICD-10-CM | POA: Insufficient documentation

## 2019-08-20 DIAGNOSIS — N819 Female genital prolapse, unspecified: Secondary | ICD-10-CM | POA: Diagnosis present

## 2019-08-20 HISTORY — DX: Presence of spectacles and contact lenses: Z97.3

## 2019-08-20 HISTORY — DX: Unspecified glaucoma: H40.9

## 2019-08-20 HISTORY — DX: Female genital prolapse, unspecified: N81.9

## 2019-08-20 HISTORY — PX: ANTERIOR AND POSTERIOR REPAIR WITH SACROSPINOUS FIXATION: SHX6536

## 2019-08-20 LAB — TYPE AND SCREEN
ABO/RH(D): O POS
Antibody Screen: NEGATIVE

## 2019-08-20 SURGERY — ANTERIOR AND POSTERIOR REPAIR WITH SACROSPINOUS FIXATION
Anesthesia: General

## 2019-08-20 MED ORDER — ONDANSETRON HCL 4 MG PO TABS
4.0000 mg | ORAL_TABLET | Freq: Four times a day (QID) | ORAL | Status: DC | PRN
  Filled 2019-08-20: qty 1

## 2019-08-20 MED ORDER — LIDOCAINE 2% (20 MG/ML) 5 ML SYRINGE
INTRAMUSCULAR | Status: AC
  Filled 2019-08-20: qty 5

## 2019-08-20 MED ORDER — OXYCODONE HCL 5 MG PO TABS
5.0000 mg | ORAL_TABLET | ORAL | Status: DC | PRN
  Administered 2019-08-21: 08:00:00 5 mg via ORAL
  Filled 2019-08-20: qty 2

## 2019-08-20 MED ORDER — SUCCINYLCHOLINE CHLORIDE 20 MG/ML IJ SOLN
INTRAMUSCULAR | Status: DC | PRN
  Administered 2019-08-20: 100 mg via INTRAVENOUS

## 2019-08-20 MED ORDER — CARVEDILOL 3.125 MG PO TABS
3.1250 mg | ORAL_TABLET | Freq: Two times a day (BID) | ORAL | Status: DC
  Administered 2019-08-20 – 2019-08-21 (×2): 3.125 mg via ORAL
  Filled 2019-08-20: qty 1

## 2019-08-20 MED ORDER — SENNA 8.6 MG PO TABS
1.0000 | ORAL_TABLET | Freq: Two times a day (BID) | ORAL | Status: DC
  Administered 2019-08-20 (×2): 8.6 mg via ORAL
  Filled 2019-08-20 (×2): qty 1

## 2019-08-20 MED ORDER — PROMETHAZINE HCL 25 MG/ML IJ SOLN
INTRAMUSCULAR | Status: AC
  Filled 2019-08-20: qty 1

## 2019-08-20 MED ORDER — OXYCODONE HCL 5 MG PO TABS
5.0000 mg | ORAL_TABLET | Freq: Once | ORAL | Status: DC | PRN
  Filled 2019-08-20: qty 1

## 2019-08-20 MED ORDER — SUCCINYLCHOLINE CHLORIDE 200 MG/10ML IV SOSY
PREFILLED_SYRINGE | INTRAVENOUS | Status: AC
  Filled 2019-08-20: qty 10

## 2019-08-20 MED ORDER — HYDROMORPHONE HCL 1 MG/ML IJ SOLN
0.2500 mg | INTRAMUSCULAR | Status: DC | PRN
  Administered 2019-08-20 (×2): 0.25 mg via INTRAVENOUS
  Filled 2019-08-20: qty 0.5

## 2019-08-20 MED ORDER — TIMOLOL HEMIHYDRATE 0.5 % OP SOLN: 1.0000 [drp] | Freq: Two times a day (BID) | OPHTHALMIC | Status: DC

## 2019-08-20 MED ORDER — OXYCODONE HCL 5 MG/5ML PO SOLN
5.0000 mg | Freq: Once | ORAL | Status: DC | PRN
  Filled 2019-08-20: qty 5

## 2019-08-20 MED ORDER — ACETAMINOPHEN 325 MG PO TABS
650.0000 mg | ORAL_TABLET | ORAL | Status: DC | PRN
  Administered 2019-08-21: 03:00:00 650 mg via ORAL
  Filled 2019-08-20: qty 2

## 2019-08-20 MED ORDER — SODIUM CHLORIDE (PF) 0.9 % IJ SOLN
INTRAMUSCULAR | Status: DC | PRN
  Administered 2019-08-20: 13.6 mL via INTRAVENOUS

## 2019-08-20 MED ORDER — ONDANSETRON HCL 4 MG/2ML IJ SOLN
INTRAMUSCULAR | Status: AC
  Filled 2019-08-20: qty 2

## 2019-08-20 MED ORDER — ACETAMINOPHEN 10 MG/ML IV SOLN
INTRAVENOUS | Status: AC
  Filled 2019-08-20: qty 100

## 2019-08-20 MED ORDER — ONDANSETRON HCL 4 MG/2ML IJ SOLN
4.0000 mg | Freq: Four times a day (QID) | INTRAMUSCULAR | Status: DC | PRN
  Filled 2019-08-20: qty 2

## 2019-08-20 MED ORDER — MECLIZINE HCL 25 MG PO TABS
25.0000 mg | ORAL_TABLET | Freq: Three times a day (TID) | ORAL | Status: DC | PRN
  Filled 2019-08-20: qty 1

## 2019-08-20 MED ORDER — LEVOTHYROXINE SODIUM 125 MCG PO TABS
125.0000 ug | ORAL_TABLET | Freq: Every day | ORAL | Status: DC
  Administered 2019-08-21: 125 ug via ORAL
  Filled 2019-08-20: qty 1

## 2019-08-20 MED ORDER — HYDROMORPHONE HCL 1 MG/ML IJ SOLN
0.2000 mg | INTRAMUSCULAR | Status: DC | PRN
  Filled 2019-08-20: qty 1

## 2019-08-20 MED ORDER — DEXAMETHASONE SODIUM PHOSPHATE 10 MG/ML IJ SOLN
INTRAMUSCULAR | Status: DC | PRN
  Administered 2019-08-20: 4 mg via INTRAVENOUS

## 2019-08-20 MED ORDER — LIDOCAINE HCL (CARDIAC) PF 100 MG/5ML IV SOSY
PREFILLED_SYRINGE | INTRAVENOUS | Status: DC | PRN
  Administered 2019-08-20: 60 mg via INTRAVENOUS

## 2019-08-20 MED ORDER — AMLODIPINE BESYLATE 5 MG PO TABS
5.0000 mg | ORAL_TABLET | Freq: Every day | ORAL | Status: DC
  Administered 2019-08-21: 08:00:00 5 mg via ORAL
  Filled 2019-08-20: qty 1

## 2019-08-20 MED ORDER — MENTHOL 3 MG MT LOZG
1.0000 | LOZENGE | OROMUCOSAL | Status: DC | PRN
  Administered 2019-08-21: 3 mg via ORAL
  Filled 2019-08-20: qty 9

## 2019-08-20 MED ORDER — SUGAMMADEX SODIUM 200 MG/2ML IV SOLN
INTRAVENOUS | Status: DC | PRN
  Administered 2019-08-20: 200 mg via INTRAVENOUS

## 2019-08-20 MED ORDER — SOD CITRATE-CITRIC ACID 500-334 MG/5ML PO SOLN
30.0000 mL | ORAL | Status: AC
  Administered 2019-08-20: 07:00:00 30 mL via ORAL
  Filled 2019-08-20: qty 30

## 2019-08-20 MED ORDER — MIDAZOLAM HCL 2 MG/2ML IJ SOLN
INTRAMUSCULAR | Status: AC
  Filled 2019-08-20: qty 2

## 2019-08-20 MED ORDER — PANTOPRAZOLE SODIUM 40 MG PO TBEC
40.0000 mg | DELAYED_RELEASE_TABLET | Freq: Two times a day (BID) | ORAL | Status: DC
  Administered 2019-08-20 – 2019-08-21 (×2): 40 mg via ORAL
  Filled 2019-08-20: qty 1

## 2019-08-20 MED ORDER — LIDOCAINE-EPINEPHRINE (PF) 1 %-1:200000 IJ SOLN
INTRAMUSCULAR | Status: DC | PRN
  Administered 2019-08-20: 3.4 mL

## 2019-08-20 MED ORDER — GABAPENTIN 300 MG PO CAPS
ORAL_CAPSULE | ORAL | Status: AC
  Filled 2019-08-20: qty 1

## 2019-08-20 MED ORDER — ROCURONIUM 10MG/ML (10ML) SYRINGE FOR MEDFUSION PUMP - OPTIME
INTRAVENOUS | Status: DC | PRN
  Administered 2019-08-20: 30 mg via INTRAVENOUS

## 2019-08-20 MED ORDER — TIMOLOL MALEATE 0.5 % OP SOLN
1.0000 [drp] | Freq: Two times a day (BID) | OPHTHALMIC | Status: DC
  Administered 2019-08-20 – 2019-08-21 (×2): 1 [drp] via OPHTHALMIC
  Filled 2019-08-20: qty 5

## 2019-08-20 MED ORDER — SCOPOLAMINE 1 MG/3DAYS TD PT72
1.0000 | MEDICATED_PATCH | TRANSDERMAL | Status: DC
  Administered 2019-08-20: 07:00:00 1.5 mg via TRANSDERMAL
  Filled 2019-08-20: qty 1

## 2019-08-20 MED ORDER — GABAPENTIN 300 MG PO CAPS
300.0000 mg | ORAL_CAPSULE | Freq: Once | ORAL | Status: AC
  Administered 2019-08-20: 07:00:00 300 mg via ORAL
  Filled 2019-08-20: qty 1

## 2019-08-20 MED ORDER — ONDANSETRON HCL 4 MG/2ML IJ SOLN
INTRAMUSCULAR | Status: DC | PRN
  Administered 2019-08-20: 4 mg via INTRAVENOUS

## 2019-08-20 MED ORDER — PROPOFOL 10 MG/ML IV BOLUS
INTRAVENOUS | Status: DC | PRN
  Administered 2019-08-20: 100 mg via INTRAVENOUS

## 2019-08-20 MED ORDER — ROCURONIUM BROMIDE 10 MG/ML (PF) SYRINGE
PREFILLED_SYRINGE | INTRAVENOUS | Status: AC
  Filled 2019-08-20: qty 10

## 2019-08-20 MED ORDER — CLINDAMYCIN PHOSPHATE 900 MG/50ML IV SOLN
INTRAVENOUS | Status: AC
  Filled 2019-08-20: qty 50

## 2019-08-20 MED ORDER — LATANOPROST 0.005 % OP SOLN
1.0000 [drp] | Freq: Every day | OPHTHALMIC | Status: DC
  Administered 2019-08-20: 1 [drp] via OPHTHALMIC
  Filled 2019-08-20: qty 2.5

## 2019-08-20 MED ORDER — EPHEDRINE 5 MG/ML INJ
INTRAVENOUS | Status: AC
  Filled 2019-08-20: qty 10

## 2019-08-20 MED ORDER — PROMETHAZINE HCL 25 MG/ML IJ SOLN
6.2500 mg | INTRAMUSCULAR | Status: DC | PRN
  Administered 2019-08-20: 09:00:00 6.25 mg via INTRAVENOUS
  Filled 2019-08-20: qty 1

## 2019-08-20 MED ORDER — LACTATED RINGERS IV SOLN
INTRAVENOUS | Status: DC
  Administered 2019-08-20 – 2019-08-21 (×2): via INTRAVENOUS
  Filled 2019-08-20 (×2): qty 1000

## 2019-08-20 MED ORDER — ALUM & MAG HYDROXIDE-SIMETH 200-200-20 MG/5ML PO SUSP
30.0000 mL | ORAL | Status: DC | PRN
  Filled 2019-08-20: qty 30

## 2019-08-20 MED ORDER — FENTANYL CITRATE (PF) 100 MCG/2ML IJ SOLN
INTRAMUSCULAR | Status: AC
  Filled 2019-08-20: qty 2

## 2019-08-20 MED ORDER — MIDAZOLAM HCL 5 MG/5ML IJ SOLN
INTRAMUSCULAR | Status: DC | PRN
  Administered 2019-08-20: 1 mg via INTRAVENOUS

## 2019-08-20 MED ORDER — HYDROMORPHONE HCL 1 MG/ML IJ SOLN
INTRAMUSCULAR | Status: AC
  Filled 2019-08-20: qty 1

## 2019-08-20 MED ORDER — FENTANYL CITRATE (PF) 100 MCG/2ML IJ SOLN
INTRAMUSCULAR | Status: DC | PRN
  Administered 2019-08-20: 100 ug via INTRAVENOUS

## 2019-08-20 MED ORDER — DEXAMETHASONE SODIUM PHOSPHATE 10 MG/ML IJ SOLN
INTRAMUSCULAR | Status: AC
  Filled 2019-08-20: qty 1

## 2019-08-20 MED ORDER — SOD CITRATE-CITRIC ACID 500-334 MG/5ML PO SOLN
ORAL | Status: AC
  Filled 2019-08-20: qty 30

## 2019-08-20 MED ORDER — SENNA 8.6 MG PO TABS
ORAL_TABLET | ORAL | Status: AC
  Filled 2019-08-20: qty 1

## 2019-08-20 MED ORDER — ACETAMINOPHEN 10 MG/ML IV SOLN
1000.0000 mg | Freq: Once | INTRAVENOUS | Status: AC
  Administered 2019-08-20: 1000 mg via INTRAVENOUS
  Filled 2019-08-20: qty 100

## 2019-08-20 MED ORDER — SCOPOLAMINE 1 MG/3DAYS TD PT72
MEDICATED_PATCH | TRANSDERMAL | Status: AC
  Filled 2019-08-20: qty 1

## 2019-08-20 MED ORDER — PROPOFOL 10 MG/ML IV BOLUS
INTRAVENOUS | Status: AC
  Filled 2019-08-20: qty 20

## 2019-08-20 MED ORDER — LACTATED RINGERS IV SOLN
INTRAVENOUS | Status: DC
  Administered 2019-08-20: 1000 mL via INTRAVENOUS
  Administered 2019-08-20: 10:00:00 via INTRAVENOUS
  Filled 2019-08-20: qty 1000

## 2019-08-20 MED ORDER — EPHEDRINE SULFATE 50 MG/ML IJ SOLN
INTRAMUSCULAR | Status: DC | PRN
  Administered 2019-08-20: 10 mg via INTRAVENOUS

## 2019-08-20 SURGICAL SUPPLY — 37 items
ADH SKN CLS APL DERMABOND .7 (GAUZE/BANDAGES/DRESSINGS)
BLADE SURG 11 STRL SS (BLADE) IMPLANT
BLADE SURG 15 STRL LF DISP TIS (BLADE) ×1 IMPLANT
BLADE SURG 15 STRL SS (BLADE) ×3
CLOTH BEACON ORANGE TIMEOUT ST (SAFETY) ×3 IMPLANT
COVER MAYO STAND STRL (DRAPES) ×3 IMPLANT
COVER WAND RF STERILE (DRAPES) ×3 IMPLANT
DECANTER SPIKE VIAL GLASS SM (MISCELLANEOUS) IMPLANT
DERMABOND ADVANCED (GAUZE/BANDAGES/DRESSINGS)
DERMABOND ADVANCED .7 DNX12 (GAUZE/BANDAGES/DRESSINGS) IMPLANT
DEVICE CAPIO SLIM SINGLE (INSTRUMENTS) ×5 IMPLANT
DISSECTOR ROUND CHERRY 3/8 STR (MISCELLANEOUS) IMPLANT
GAUZE PACKING 1 X5 YD ST (GAUZE/BANDAGES/DRESSINGS) ×3 IMPLANT
GLOVE BIO SURGEON STRL SZ8 (GLOVE) ×6 IMPLANT
GOWN STRL REUS W/TWL LRG LVL3 (GOWN DISPOSABLE) ×12 IMPLANT
GOWN STRL REUS W/TWL XL LVL3 (GOWN DISPOSABLE) ×3 IMPLANT
HOLDER FOLEY CATH W/STRAP (MISCELLANEOUS) ×3 IMPLANT
NDL MAYO CATGUT SZ4 TPR NDL (NEEDLE) ×1 IMPLANT
NEEDLE HYPO 22GX1.5 SAFETY (NEEDLE) ×3 IMPLANT
NEEDLE MAYO CATGUT SZ4 (NEEDLE) ×3 IMPLANT
NS IRRIG 500ML POUR BTL (IV SOLUTION) ×3 IMPLANT
PACK VAGINAL WOMENS (CUSTOM PROCEDURE TRAY) ×3 IMPLANT
PAD OB MATERNITY 4.3X12.25 (PERSONAL CARE ITEMS) ×3 IMPLANT
PLUG CATH AND CAP STER (CATHETERS) IMPLANT
SET IRRIG Y TYPE TUR BLADDER L (SET/KITS/TRAYS/PACK) IMPLANT
SPONGE LAP 4X18 RFD (DISPOSABLE) IMPLANT
SUT CAPIO PGA 48IN SZ 0 (SUTURE) ×4 IMPLANT
SUT CAPIO POLYGLYCOLIC (SUTURE) ×4 IMPLANT
SUT MNCRL 0 MO-4 VIOLET 18 CR (SUTURE) ×1 IMPLANT
SUT MON AB 2-0 CT1 36 (SUTURE) ×6 IMPLANT
SUT MONOCRYL 0 MO 4 18  CR/8 (SUTURE) ×2
SUT VIC AB 2-0 CT2 27 (SUTURE) ×6 IMPLANT
SUT VIC AB 2-0 UR6 27 (SUTURE) IMPLANT
SYR BULB IRRIGATION 50ML (SYRINGE) ×3 IMPLANT
TOWEL OR 17X26 10 PK STRL BLUE (TOWEL DISPOSABLE) ×6 IMPLANT
TRAY FOLEY W/BAG SLVR 14FR (SET/KITS/TRAYS/PACK) ×3 IMPLANT
WATER STERILE IRR 500ML POUR (IV SOLUTION) ×3 IMPLANT

## 2019-08-20 NOTE — Progress Notes (Signed)
Feels good. Has not required any pain medication. Ambulating, tolerating regular diet.  Today's Vitals   08/20/19 1136 08/20/19 1400 08/20/19 1459 08/20/19 1815  BP: 125/66  (!) 107/59   Pulse: (!) 59  71   Resp: 15  16   Temp: (!) 97 F (36.1 C)  (!) 96.7 F (35.9 C)   TempSrc:      SpO2: 97%  99%   Weight:      Height:      PainSc: 0-No pain Asleep 0-No pain 0-No pain   Body mass index is 31.02 kg/m.   Abdomen soft UO clear, dilute  A/P: Stable         Per orders

## 2019-08-20 NOTE — Transfer of Care (Signed)
Immediate Anesthesia Transfer of Care Note  Patient: Julie Jefferson  Procedure(s) Performed: ANTERIOR AND POSTERIOR REPAIR WITH possible SACROSPINOUS FIXATION (N/A )  Patient Location: PACU  Anesthesia Type:General  Level of Consciousness: awake, alert  and oriented  Airway & Oxygen Therapy: Patient Spontanous Breathing and Patient connected to face mask oxygen  Post-op Assessment: Report given to RN and Post -op Vital signs reviewed and stable  Post vital signs: Reviewed and stable  Last Vitals:  Vitals Value Taken Time  BP 124/71 08/20/19 0848  Temp    Pulse 56 08/20/19 0850  Resp 15 08/20/19 0850  SpO2 100 % 08/20/19 0850  Vitals shown include unvalidated device data.  Last Pain:  Vitals:   08/20/19 0616  TempSrc: Oral  PainSc: 0-No pain      Patients Stated Pain Goal: 6 ([id number])  Complications: No apparent anesthesia complications

## 2019-08-20 NOTE — Op Note (Signed)
NAME: Julie Jefferson, Julie Jefferson MEDICAL RECORD [id number] ACCOUNT [id number] DATE OF BIRTH:November 09, 1946 FACILITY: [facility] LOCATION: [facility] PHYSICIAN:Shaneque Merkle E. Pier Laux II, MD  OPERATIVE REPORT  DATE OF PROCEDURE:  08/20/2019  PREOPERATIVE DIAGNOSIS:  Pelvic prolapse.  POSTOPERATIVE DIAGNOSIS:  Pelvic prolapse.  PROCEDURE:  Anterior and posterior colporrhaphy with sacrospinous ligament suspension.  SURGEON:  Everlene Farrier MD  ASSISTANT:  Arnetha Gula, MD  ANESTHESIA:  General with endotracheal intubation.  ESTIMATED BLOOD LOSS:  Per anesthesia note.  SPECIMENS:  None.  INDICATIONS AND CONSENT:  This patient is a 73 year old patient status post hysterectomy with symptomatic pelvic prolapse.  Details are dictated in the history and physical.  Anterior, posterior vaginal repair with sacrospinous ligament suspension has  been discussed preoperatively.  Potential risks and complications are discussed preoperatively including but not limited to infection, organ damage, bleeding requiring transfusion of blood products with HIV and hepatitis acquisition, DVT, PE, pneumonia,  fistula formation, pelvic and perineal pain.  Return to the operating room and recurrence of prolapse.  The patient states she understands and agrees and consent was signed on the chart.  DESCRIPTION OF PROCEDURE:  The patient was taken to the operating room where she was placed in the dorsal supine position and general anesthesia was induced via endotracheal intubation.  She was placed in the dorsal lithotomy position.  She was prepped  vaginally with Betadine, bladder straight catheterized and she was draped in a sterile fashion.  Timeout was undertaken.  Examination revealed a second-degree cystocele, prolapse of the vaginal cuff and a 1st degree rectocele.  The cuff was grasped at  both corners with Allis clamps and in the midline in an inverted T shape.  The anterior vaginal mucosa was then injected with 1% lidocaine with  epinephrine diluted in a normal saline.  An inverted T incision was made progressing at the midline of the  anterior vaginal wall to a point about 2 cm below the urethral meatus.  The mucosa was then dissected bilaterally sharply and bluntly.  The bladder is then reduced with interrupted figure-of-eight 0 Monocryl sutures.  This does a good job of reducing the  cystocele.  A small amount of excess vaginal mucosa was trimmed and the mucosa was closed in a running locking fashion with 2-0 Vicryl suture.  Posteriorly, the perineum was grasped in inverted T shape again.  A very shallow wedge was removed from the  perineal body.  The mucosa was injected with a similar solution.  The mucosa was dissected in the midline sharply and bluntly about 2/3 of the length of the posterior vaginal wall.  The mucosa was then dissected from the underlying tissues sharply and  bluntly bilaterally.  This was carried up on the right side until the ischial spine and the sacrospinous ligament was palpated.  Then, using the Capio needle driver, a Vicryl suture was placed 2 fingerbreadths medial to the spine without difficulty.   This was then passed through the superior backside of the vagina and the suture was held.  The rectovaginal fascia was then reapproximated with interrupted figure-of-eight 0 Monocryl sutures.  A small amount of rectal mucosa was trimmed.  The upper  one-third of the mucosa was closed in a running locking fashion with 2-0 Vicryl suture, which was then held.  At this point, the sacrospinous suspension suture was tied down, which elevates the upper vagina well.  Remainder of the vagina is then  continued to be closed with a 2-0 Vicryl suture also closing the perineal body.  Good  hemostasis was noted.  Foley catheter was placed and clear urine was noted.  Vaginal pack with estrogen cream was placed.  All counts were correct.  The patient was  awakened and taken to recovery room in stable condition.  TN/NUANCE   D:08/20/2019 T:08/20/2019 JOB:[id number]

## 2019-08-20 NOTE — Anesthesia Postprocedure Evaluation (Signed)
Anesthesia Post Note  Patient: Julie Jefferson  Procedure(s) Performed: ANTERIOR AND POSTERIOR REPAIR WITH possible SACROSPINOUS FIXATION (N/A )     Patient location during evaluation: PACU Anesthesia Type: General Level of consciousness: awake and alert Pain management: pain level controlled Vital Signs Assessment: post-procedure vital signs reviewed and stable Respiratory status: spontaneous breathing, nonlabored ventilation, respiratory function stable and patient connected to nasal cannula oxygen Cardiovascular status: blood pressure returned to baseline and stable Postop Assessment: no apparent nausea or vomiting Anesthetic complications: no    Last Vitals:  Vitals:   08/20/19 0930 08/20/19 0945  BP: 123/64   Pulse: (!) 59 60  Resp: 20 12  Temp:    SpO2: 93% 94%    Last Pain:  Vitals:   08/20/19 0945  TempSrc:   PainSc: Asleep                 Octa Uplinger S

## 2019-08-20 NOTE — Progress Notes (Signed)
No change to H&P per patient history Reviewed with patient procedure-A&P repair, possible SSLS All questions answered Patient states she understands and agrees

## 2019-08-20 NOTE — Anesthesia Procedure Notes (Signed)
Procedure Name: Intubation Performed by: Jonna Munro, CRNA Pre-anesthesia Checklist: Patient identified, Emergency Drugs available, Suction available, Patient being monitored and Timeout performed Patient Re-evaluated:Patient Re-evaluated prior to induction Oxygen Delivery Method: Circle system utilized Preoxygenation: Pre-oxygenation with 100% oxygen Induction Type: IV induction Laryngoscope Size: Mac and 3 Grade View: Grade I Tube type: Oral Tube size: 7.0 mm Number of attempts: 1 Airway Equipment and Method: Stylet Placement Confirmation: ETT inserted through vocal cords under direct vision,  positive ETCO2 and breath sounds checked- equal and bilateral Secured at: 21 cm Tube secured with: Tape Dental Injury: Teeth and Oropharynx as per pre-operative assessment

## 2019-08-20 NOTE — Anesthesia Preprocedure Evaluation (Signed)
Anesthesia Evaluation  Patient identified by MRN, date of birth, ID band Patient awake    Reviewed: Allergy & Precautions, NPO status , Patient's Chart, lab work & pertinent test results  Airway Mallampati: II  TM Distance: >3 FB Neck ROM: Full    Dental no notable dental hx.    Pulmonary neg pulmonary ROS,    Pulmonary exam normal breath sounds clear to auscultation       Cardiovascular hypertension, Normal cardiovascular exam Rhythm:Regular Rate:Normal     Neuro/Psych negative neurological ROS  negative psych ROS   GI/Hepatic Neg liver ROS, GERD  ,  Endo/Other  Hypothyroidism   Renal/GU negative Renal ROS  negative genitourinary   Musculoskeletal negative musculoskeletal ROS (+)   Abdominal   Peds negative pediatric ROS (+)  Hematology negative hematology ROS (+)   Anesthesia Other Findings   Reproductive/Obstetrics negative OB ROS                             Anesthesia Physical Anesthesia Plan  ASA: II  Anesthesia Plan: General   Post-op Pain Management:    Induction: Intravenous  PONV Risk Score and Plan: 3 and Ondansetron, Dexamethasone and Treatment may vary due to age or medical condition  Airway Management Planned: Oral ETT  Additional Equipment:   Intra-op Plan:   Post-operative Plan: Extubation in OR  Informed Consent: I have reviewed the patients History and Physical, chart, labs and discussed the procedure including the risks, benefits and alternatives for the proposed anesthesia with the patient or authorized representative who has indicated his/her understanding and acceptance.     Dental advisory given  Plan Discussed with: CRNA and Surgeon  Anesthesia Plan Comments:         Anesthesia Quick Evaluation  

## 2019-08-20 NOTE — Progress Notes (Signed)
08/20/2019  8:41 AM  PATIENT:  Julie Jefferson  73 y.o. female  PRE-OPERATIVE DIAGNOSIS:  pelvic prolapse  POST-OPERATIVE DIAGNOSIS:  Pelvic prolapse  PROCEDURE:  Procedure(s) with comments: ANTERIOR AND POSTERIOR REPAIR WITH possible SACROSPINOUS FIXATION (N/A) - need bed  SURGEON:  Surgeon(s) and Role:    * Everlene Farrier, MD - Primary    * Arvella Nigh, MD - Assisting  PHYSICIAN ASSISTANT:   ASSISTANTS: Arvella Nigh MD   ANESTHESIA:   general  EBL:  10 mL   BLOOD ADMINISTERED:none  DRAINS: Urinary Catheter (Foley)   LOCAL MEDICATIONS USED:  LIDOCAINE  and Amount: 20 ml  SPECIMEN:  No Specimen  DISPOSITION OF SPECIMEN:  N/A  COUNTS:  YES  TOURNIQUET:  * No tourniquets in log *  DICTATION: .Other Dictation: Dictation Number [id number]  PLAN OF CARE: Admit for overnight observation  PATIENT DISPOSITION:  PACU - hemodynamically stable.   Delay start of Pharmacological VTE agent (>24hrs) due to surgical blood loss or risk of bleeding: not applicable

## 2019-08-21 ENCOUNTER — Encounter (HOSPITAL_BASED_OUTPATIENT_CLINIC_OR_DEPARTMENT_OTHER): Payer: Self-pay | Admitting: Obstetrics and Gynecology

## 2019-08-21 DIAGNOSIS — N8111 Cystocele, midline: Secondary | ICD-10-CM | POA: Diagnosis not present

## 2019-08-21 LAB — CBC
HCT: 35 % — ABNORMAL LOW (ref 36.0–46.0)
Hemoglobin: 11 g/dL — ABNORMAL LOW (ref 12.0–15.0)
MCH: 28.6 pg (ref 26.0–34.0)
MCHC: 31.4 g/dL (ref 30.0–36.0)
MCV: 90.9 fL (ref 80.0–100.0)
Platelets: 269 10*3/uL (ref 150–400)
RBC: 3.85 MIL/uL — ABNORMAL LOW (ref 3.87–5.11)
RDW: 13.9 % (ref 11.5–15.5)
WBC: 11 10*3/uL — ABNORMAL HIGH (ref 4.0–10.5)
nRBC: 0 % (ref 0.0–0.2)

## 2019-08-21 MED ORDER — OXYCODONE HCL 5 MG PO TABS
ORAL_TABLET | ORAL | Status: AC
  Filled 2019-08-21: qty 1

## 2019-08-21 MED ORDER — ACETAMINOPHEN 325 MG PO TABS
ORAL_TABLET | ORAL | Status: AC
  Filled 2019-08-21: qty 2

## 2019-08-21 MED ORDER — ACETAMINOPHEN 325 MG PO TABS: 650.0000 mg | ORAL_TABLET | Freq: Four times a day (QID) | ORAL | 0 refills | Status: DC | PRN

## 2019-08-21 MED ORDER — MENTHOL 3 MG MT LOZG
LOZENGE | OROMUCOSAL | Status: AC
  Filled 2019-08-21: qty 9

## 2019-08-21 NOTE — Discharge Instructions (Signed)
Anterior and Posterior Colporrhaphy and Sling Procedure, Care After °This sheet gives you information about how to care for yourself after your procedure. Your health care provider may also give you more specific instructions. If you have problems or questions, contact your health care provider. °What can I expect after the procedure? °After the procedure, it is common to have: °· Pain in the surgical area. °· Vaginal discharge. You will need to use a sanitary pad during this time. °· Fatigue. °Follow these instructions at home: °Incision care ° °· Follow instructions from your health care provider about how to take care of your incision. Make sure you: °? Wash your hands with soap and water before touching the incision area. If soap and water are not available, use hand sanitizer. °? Clean your incision as told by your health care provider. °? Leave stitches (sutures), skin glue, or adhesive strips in place. These skin closures may need to stay in place for 2 weeks or longer. If adhesive strip edges start to loosen and curl up, you may trim the loose edges. Do not remove adhesive strips completely unless your health care provider tells you to do that. °· Check your incision area every day for signs of infection. Check for: °? Redness, swelling, or pain. °? Fluid or blood. °? Warmth. °? Pus or a bad smell. °· Check your incision every day to make sure the incision area is not separating or opening. °· Do not take baths, swim, or use a hot tub until your health care provider approves. You may shower. °· Keep the area between your vagina and rectum (perineal area) clean and dry. Make sure you clean the area after each bowel movement and each time you urinate. °· Ask your health care provider if you can take a sitz bath or sit in a tub of clean, warm water. °Activity °· Do gentle, daily activity as told by your health care provider. You may be told to take short walks every day and go farther each time. Ask your health  care provider what activities are safe for you. °· Limit stair climbing to once or twice a day in the first week, then slowly increase this activity. °· Do not lift anything that is heavier than 10 lbs. (4.5 kg), or the limit that your health care provider tells you, until he or she says that it is safe. Avoid pushing or pulling motions. °· Avoid standing for long periods of time. °· Do not douche, use tampons, or have sex until your health care provider says it is okay. °· Do not drive or use heavy machinery while taking prescription pain medicine. °To prevent constipation °· To prevent or treat constipation while you are taking prescription pain medicine, your health care provider may recommend that you: °? Take over-the-counter or prescription medicines. °? Eat foods that are high in fiber, such as fresh fruits and vegetables, whole grains, and beans. °? Drink enough fluid to keep your urine clear or pale yellow. °? Limit foods that are high in fat and processed sugars, such as fried and sweet foods. °General instructions °· You may be instructed to do pelvic floor exercises (kegels) as told by your health care provider. °· Take over-the-counter and prescription medicines only as told by your health care provider. °· Keep all follow-up visits as told by your health care provider. This is important. °Contact a health care provider if: °· Medicine does not help your pain. °· You have frequent or urgent urination, or   you are unable to completely empty your bladder. °· You feel a burning sensation when urinating. °· You have fluid or blood coming from your incision. °· You have pus or a bad smell coming from the incision. °· Your incision feels warm to the touch. °· You have redness, swelling, or pain around your incision. °Get help right away if: °· You have a fever or chills. °· Your incision separates or opens. °· You cannot urinate. °· You have trouble breathing. °Summary °· After the procedure, it is common to  have pain, fatigue, and discharge from the vagina. °· Keep the area between your vagina and rectum (perineal area) clean and dry. Make sure you clean the area after each bowel movement and each time you urinate. °· Follow instructions from your health care provider on any activity restrictions after the procedure. °This information is not intended to replace advice given to you by your health care provider. Make sure you discuss any questions you have with your health care provider. °Document Released: 07/03/2004 Document Revised: 11/01/2017 Document Reviewed: 11/19/2016 °Elsevier Patient Education © 2020 [employer] ° °

## 2019-08-21 NOTE — Discharge Summary (Signed)
Physician Discharge Summary  Patient ID: Julie Jefferson MRN: [id number] DOB/AGE: December 13, 1945 73 y.o.  Admit date: 08/20/2019 Discharge date: 08/21/2019  Admission Diagnoses:Pelvic prolapse   Discharge Diagnoses:  Active Problems:   Pelvic prolapse   Discharged Condition: good  Hospital Course: admitted for surgery. Had good resumption of bowel/bladder function, ambulating, tolerating regular diet, good pain relief.  Consults: None  Significant Diagnostic Studies: labs:  Results for orders placed or performed during the hospital encounter of 08/20/19 (from the past 24 hour(s))  CBC     Status: Abnormal   Collection Time: 08/21/19  5:54 AM  Result Value Ref Range   WBC 11.0 (H) 4.0 - 10.5 K/uL   RBC 3.85 (L) 3.87 - 5.11 MIL/uL   Hemoglobin 11.0 (L) 12.0 - 15.0 g/dL   HCT 35.0 (L) 36.0 - 46.0 %   MCV 90.9 80.0 - 100.0 fL   MCH 28.6 26.0 - 34.0 pg   MCHC 31.4 30.0 - 36.0 g/dL   RDW 13.9 11.5 - 15.5 %   Platelets 269 150 - 400 K/uL   nRBC 0.0 0.0 - 0.2 %    Treatments: surgery: A&P repair with SSLS  Discharge Exam: Blood pressure 134/77, pulse 63, temperature 98.8 F (37.1 C), resp. rate 18, height 5' 5.5" (1.664 m), weight 85.9 kg, SpO2 100 %. General appearance: alert, cooperative and no distress GI: soft, non-tender; bowel sounds normal; no masses,  no organomegaly  Disposition: Discharge disposition: 01-Home or Self Care       Discharge Instructions    Discharge patient   Complete by: As directed    Discharge disposition: 01-Home or Self Care   Discharge patient date: 08/21/2019     Allergies as of 08/21/2019      Reactions   Alphagan [brimonidine] Other (See Comments)   Dries eyes out.    Dorzolamide Hcl Other (See Comments)   Dried eyes out.   Penicillins Other (See Comments)   Has patient had a PCN reaction causing immediate rash, facial/tongue/throat swelling, SOB or lightheadedness with hypotension: Unknown childhood reaction Has patient had a PCN  reaction causing severe rash involving mucus membranes or skin necrosis: No Has patient had a PCN reaction that required hospitalization No Has patient had a PCN reaction occurring within the last 10 years: No If all of the above answers are "NO", then may proceed with Cephalosporin use.   Latex Rash      Medication List    STOP taking these medications   cefdinir 300 MG capsule Commonly known as: OMNICEF     TAKE these medications   acetaminophen 325 MG tablet Commonly known as: TYLENOL Take 2 tablets (650 mg total) by mouth every 6 (six) hours as needed for mild pain (temperature > 101.5.).   amLODipine 5 MG tablet Commonly known as: NORVASC Take 5 mg by mouth daily.   aspirin EC 81 MG tablet Take 1 tablet (81 mg total) by mouth 2 (two) times daily. For DVT prophylaxis for 30 days after surgery. What changed: when to take this   bimatoprost 0.01 % Soln Commonly known as: LUMIGAN Place 1 drop into both eyes at bedtime.   carvedilol 3.125 MG tablet Commonly known as: COREG Take 1 tablet (3.125 mg total) by mouth 2 (two) times daily.   levothyroxine 125 MCG tablet Commonly known as: SYNTHROID Take 125 mcg by mouth daily before breakfast.   meclizine 25 MG tablet Commonly known as: ANTIVERT Take 25 mg by mouth 3 (three) times daily as  needed for dizziness.   OVER THE COUNTER MEDICATION Take 1 capsule by mouth 2 (two) times daily. Hydro Eye Supplement   pantoprazole 40 MG tablet Commonly known as: PROTONIX Take 1 tablet (40 mg total) by mouth 2 (two) times daily.   polyethylene glycol powder 17 GM/SCOOP powder Commonly known as: GLYCOLAX/MIRALAX Take 17 g by mouth daily as needed for mild constipation.   simvastatin 10 MG tablet Commonly known as: ZOCOR Take 1 tablet (10 mg total) by mouth at bedtime.   timolol 0.5 % ophthalmic solution Commonly known as: BETIMOL Place 1 drop into both eyes 2 (two) times daily.        Signed: Shon Millet  II 08/21/2019, 1:26 PM

## 2019-08-21 NOTE — Progress Notes (Signed)
Pack and foley out, voiding, tolerating regular diet, ambulating, feels good  Vitals:   08/21/19 0212 08/21/19 0600  BP: (!) 124/59 (!) 124/59  Pulse: 68 62  Resp: 18 18  Temp: 99.3 F (37.4 C) 98.6 F (37 C)  SpO2: 95% 99%    Abdomen soft  Results for orders placed or performed during the hospital encounter of 08/20/19 (from the past 24 hour(s))  CBC     Status: Abnormal   Collection Time: 08/21/19  5:54 AM  Result Value Ref Range   WBC 11.0 (H) 4.0 - 10.5 K/uL   RBC 3.85 (L) 3.87 - 5.11 MIL/uL   Hemoglobin 11.0 (L) 12.0 - 15.0 g/dL   HCT 35.0 (L) 36.0 - 46.0 %   MCV 90.9 80.0 - 100.0 fL   MCH 28.6 26.0 - 34.0 pg   MCHC 31.4 30.0 - 36.0 g/dL   RDW 13.9 11.5 - 15.5 %   Platelets 269 150 - 400 K/uL   nRBC 0.0 0.0 - 0.2 %    A/P: D/C home         Instructions reviewed         FU office 1 week

## 2019-08-21 NOTE — Progress Notes (Signed)
Foley catheter and vaginal packing were removed per orders. Patient informed to call RN for assistance when she needs to void. Patient has no complaints of pain; will continue to monitor patient.

## 2020-02-03 ENCOUNTER — Other Ambulatory Visit: Payer: Self-pay | Admitting: Cardiovascular Disease

## 2020-02-05 ENCOUNTER — Other Ambulatory Visit: Payer: Self-pay | Admitting: Cardiovascular Disease

## 2020-03-05 ENCOUNTER — Other Ambulatory Visit: Payer: Self-pay | Admitting: Cardiovascular Disease

## 2020-03-23 ENCOUNTER — Other Ambulatory Visit: Payer: Self-pay | Admitting: Cardiovascular Disease

## 2020-04-25 NOTE — Progress Notes (Signed)
Cardiology Office Note  Date: 04/27/2020   ID: Chai, Cotrone October 14, 1946, MRN [id number]  PCP:  Galen Manila, MD  Cardiologist:  Kate Sable, MD Electrophysiologist:  None   Chief Complaint: F/U Takotsubo's Cardiomyopathy, syncope  History of Present Illness: Julie Jefferson is a 74 y.o. female with a history of Takotsubo Cardiomyopathy, syncope (near syncopal episode 2/2 volume depletion 2014). Cardiac cath 2008 minimal CAD, Echo 2014 EF 60-65% no WMA's  Last encounter with Dr. Bronson Ing 09/11/2018 she had no c/o CP, palpitations, SOB, lightheadedness, dizziness, leg swelling, orthopnea, PND, or syncope. BP's were normal at home. Takotsubo's had resolved with EF improved to 55-60% on echo 05/2019.  Patient states she has been having issues with some mid epigastric discomfort with some radiation.  She states she had a CT scan ordered by her primary care provider which said she had a mass but it was of no concern.  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  She has been working approximately 50 hours plus per week.  She has been having issues with her thyroid.  She states her thyroid medication has been adjusted up and down by her primary care provider recently.  She denies any classic anginal symptoms such as chest pain, pressure, tightness, radiation to neck, arm, back, jaw.  No nausea, vomiting, or diaphoresis.  No orthostatic symptoms/presyncope or syncopal episodes, no bleeding in stools.  No orthopnea or paroxysmal nocturnal dyspnea.  No claudication-like symptoms or DVT-like symptoms.  She does have mild lower extremity edema which progresses during the day.  She denies any history of venous insufficiency or venous varicosities.  She does states she has some exertional fatigue, more than usual recently.  States she has not been  referred to GI for the mass in her abdomen by PCP.  History of Takotsubo's cardiomyopathy which had resolved.  Cardiac catheterization in 2008 had minimal CAD.  Past Medical History:  Diagnosis Date  . Aortic atherosclerosis ([place])   . Arthritis    rt knee, back, hands  . Bilateral lower extremity edema   . Breast cancer ([place]) 2005   left  . Chest pain    February, 2014  . Closed right ankle fracture 2016  . Collagen vascular disease ([place])   . Cough    ACE cough 2012  . Ejection fraction    EF 60-65%, echo, February, 2009, (LV function normalized).  . GERD (gastroesophageal reflux disease)   . Glaucoma   . History of anemia    age 53's  . History of breast cancer    left  . HLD (hyperlipidemia)    mixed  . Hypothyroidism   . Pelvic prolapse   . Personal history of chemotherapy 2005  . Personal history of radiation therapy 2005  . Pre-syncope    Hospital, June, 2014, probably dehydration  . Takotsubo cardiomyopathy 2008   cardiac cath 11/08. minimal coronary disease, anteroseptal akinesis with an EF 55%.  echo 2/09 EF 60-65% (LV function normalized)  . Wears glasses     Past Surgical History:  Procedure Laterality Date  . ANTERIOR AND POSTERIOR REPAIR WITH SACROSPINOUS FIXATION N/A 08/20/2019   Procedure: ANTERIOR AND POSTERIOR REPAIR WITH possible SACROSPINOUS FIXATION;  Surgeon: Everlene Farrier, MD;  Location: [facility];  Service: Gynecology;  Laterality: N/A;  need bed  . BACK SURGERY    .  BREAST BIOPSY  01/03/2012  . BREAST LUMPECTOMY Left 2005   left breast  . BUNIONECTOMY     left  . CHOLECYSTECTOMY    . COLONOSCOPY    . GANGLION CYST EXCISION     left  . PARTIAL KNEE ARTHROPLASTY Right 12/28/2016   Procedure: RIGHT UNICOMPARTMENTAL KNEE;  Surgeon: Renette Butters, MD;  Location: [facility];  Service: Orthopedics;  Laterality: Right;  . PARTIAL KNEE ARTHROPLASTY Left 09/23/2018   Procedure: UNICOMPARTMENTAL LEFT  KNEE;  Surgeon:  Renette Butters, MD;  Location: [facility];  Service: Orthopedics;  Laterality: Left;  . TOTAL ABDOMINAL HYSTERECTOMY  1997  . UPPER GI ENDOSCOPY      Current Outpatient Medications  Medication Sig Dispense Refill  . acetaminophen (TYLENOL) 325 MG tablet Take 2 tablets (650 mg total) by mouth every 6 (six) hours as needed for mild pain (temperature > 101.5.). 60 tablet 0  . amLODipine (NORVASC) 5 MG tablet Take 5 mg by mouth daily.     Marland Kitchen aspirin EC 81 MG tablet Take 1 tablet (81 mg total) by mouth 2 (two) times daily. For DVT prophylaxis for 30 days after surgery. (Patient taking differently: Take 81 mg by mouth every evening. For DVT prophylaxis for 30 days after surgery.) 60 tablet 0  . bimatoprost (LUMIGAN) 0.01 % SOLN Place 1 drop into both eyes at bedtime.     . carvedilol (COREG) 3.125 MG tablet TAKE 1 TABLET BY MOUTH TWICE A DAY 7 tablet 0  . levothyroxine (SYNTHROID) 137 MCG tablet Take 137 mcg by mouth daily.    . meclizine (ANTIVERT) 25 MG tablet Take 25 mg by mouth 3 (three) times daily as needed for dizziness.     . Omeprazole (PRILOSEC PO) Take 80 mg by mouth daily.    Marland Kitchen OVER THE COUNTER MEDICATION Take 1 capsule by mouth 2 (two) times daily. [employer]    . polyethylene glycol powder (GLYCOLAX/MIRALAX) powder Take 17 g by mouth daily as needed for mild constipation.     . simvastatin (ZOCOR) 10 MG tablet TAKE 1 TABLET BY MOUTH EVERYDAY AT BEDTIME *NEEDS OFFICE VISIT* 90 tablet 0  . timolol (BETIMOL) 0.5 % ophthalmic solution Place 1 drop into both eyes 2 (two) times daily.      No current facility-administered medications for this visit.   Allergies:  Alphagan [brimonidine], Dorzolamide hcl, Penicillins, and Latex   Social History: The patient  reports that she has never smoked. She has never used smokeless tobacco. She reports that she does not drink alcohol or use drugs.   Family History: The patient's family history includes Breast cancer in her maternal aunt,  mother, and sister; Cancer in her sister; Cancer (age of onset: 89) in her maternal aunt and mother; Coronary artery disease in an other family member; Heart disease in her father.   ROS:  Please see the history of present illness. Otherwise, complete review of systems is positive for none.  All other systems are reviewed and negative.   Physical Exam: VS:  BP 138/62   Pulse 66   Ht 5' 4.5" (1.638 m)   Wt 208 lb (94.3 kg)   SpO2 99%   BMI 35.15 kg/m , BMI Body mass index is 35.15 kg/m.  Wt Readings from Last 3 Encounters:  04/27/20 208 lb (94.3 kg)  08/20/19 189 lb 5 oz (85.9 kg)  07/08/19 192 lb (87.1 kg)    General: Patient appears comfortable at rest. Neck: Supple, no  elevated JVP or carotid bruits, no thyromegaly. Lungs: Clear to auscultation, nonlabored breathing at rest. Cardiac: Regular rate and rhythm, no S3 or significant systolic murmur, no pericardial rub. Extremities: Mild bilateral non-pitting edema, distal pulses 2+. Skin: Warm and dry. Musculoskeletal: No kyphosis. Neuropsychiatric: Alert and oriented x3, affect grossly appropriate.  ECG:  An ECG dated 04/27/2020 was personally reviewed today and demonstrated:  Normal sinus rhythm rate of 60  Recent Labwork: 08/17/2019: ALT 30; AST 30; BUN 17; Creatinine, Ser 0.74; Potassium 4.6; Sodium 139 08/21/2019: Hemoglobin 11.0; Platelets 269     Component Value Date/Time   CHOL  10/25/2007 0058    161        ATP III CLASSIFICATION:  <200     mg/dL   Desirable  200-239  mg/dL   Borderline High  >=240    mg/dL   High   TRIG 53 10/25/2007 0058   HDL 70 10/25/2007 0058   CHOLHDL 2.3 10/25/2007 0058   VLDL 11 10/25/2007 0058   LDLCALC  10/25/2007 0058    80        Total Cholesterol/HDL:CHD Risk Coronary Heart Disease Risk Table                     Men   Women  1/2 Average Risk   3.4   3.3    Other Studies Reviewed Today:  MPI Stress test 05/14/2019  No diagnostic ST segment changes to indicate ischemia.  Small,  mild intensity, partially reversible apical anteroseptal defect that is most consistent with variable soft tissue attenuation. No large ischemic territories noted.  This is a low risk study.  Nuclear stress EF: 71%.  Echocardiogram 05/14/2019 IMPRESSIONS  1. The left ventricle has normal systolic function, with an ejection  fraction of 55-60%. The cavity size was normal. Left ventricular diastolic  parameters were normal. No evidence of left ventricular regional wall  motion abnormalities.  2. The right ventricle has normal systolic function. The cavity was  normal. There is no increase in right ventricular wall thickness. Right  ventricular systolic pressure could not be assessed.  3. The aortic valve is tricuspid. Mild aortic annular calcification  noted.  4. The mitral valve is grossly normal.  5. The tricuspid valve is grossly normal.  6. The aortic root is normal in size and structure.   Assessment and Plan:  1. Takotsubo syndrome   2. Essential hypertension   3. Mixed hyperlipidemia   4. Shortness of breath   5. Lower extremity edema    1. Takotsubo syndrome History of Takotsubo's which has resolved.  2. Essential hypertension Blood pressure is mildly elevated at 138/62.  Continue amlodipine 5 mg, carvedilol 3.125 mg p.o. twice daily,   3. Mixed hyperlipidemia Continue simvastatin 10 mg daily.  Follows with PCP for lipid management.  4.  Dyspnea on exertion with leg edema Patient states she has been having increasing dyspnea over the last couple of months with increasing leg edema.  Get repeat echocardiogram.   Medication Adjustments/Labs and Tests Ordered: Current medicines are reviewed at length with the patient today.  Concerns regarding medicines are outlined above.   Disposition: Follow-up with Dr. Bronson Ing or APP 3 months. Signed, Levell July, NP 04/27/2020 9:38 AM    [facility] at [place], [place], [place]  [address] Phone: [phone]; Fax: [phone]

## 2020-04-27 ENCOUNTER — Encounter: Payer: Self-pay | Admitting: Family Medicine

## 2020-04-27 ENCOUNTER — Other Ambulatory Visit: Payer: Self-pay

## 2020-04-27 ENCOUNTER — Encounter: Payer: Self-pay | Admitting: *Deleted

## 2020-04-27 ENCOUNTER — Ambulatory Visit (INDEPENDENT_AMBULATORY_CARE_PROVIDER_SITE_OTHER): Payer: Medicare Other | Admitting: Family Medicine

## 2020-04-27 VITALS — BP 138/62 | HR 66 | Ht 64.5 in | Wt 208.0 lb

## 2020-04-27 DIAGNOSIS — I10 Essential (primary) hypertension: Secondary | ICD-10-CM

## 2020-04-27 DIAGNOSIS — I5181 Takotsubo syndrome: Secondary | ICD-10-CM | POA: Diagnosis not present

## 2020-04-27 DIAGNOSIS — E782 Mixed hyperlipidemia: Secondary | ICD-10-CM | POA: Diagnosis not present

## 2020-04-27 DIAGNOSIS — R0602 Shortness of breath: Secondary | ICD-10-CM | POA: Diagnosis not present

## 2020-04-27 DIAGNOSIS — R600 Localized edema: Secondary | ICD-10-CM

## 2020-04-27 NOTE — Patient Instructions (Signed)
Medication Instructions:  Your physician recommends that you continue on your current medications as directed. Please refer to the Current Medication list given to you today.  *If you need a refill on your cardiac medications before your next appointment, please call your pharmacy*   Lab Work: NONE   If you have labs (blood work) drawn today and your tests are completely normal, you will receive your results only by: Marland Kitchen MyChart Message (if you have MyChart) OR . A paper copy in the mail If you have any lab test that is abnormal or we need to change your treatment, we will call you to review the results.   Testing/Procedures: Your physician has requested that you have an echocardiogram. Echocardiography is a painless test that uses sound waves to create images of your heart. It provides your doctor with information about the size and shape of your heart and how well your heart's chambers and valves are working. This procedure takes approximately one hour. There are no restrictions for this procedure.     Follow-Up: At [facility], you and your health needs are our priority.  As part of our continuing mission to provide you with exceptional heart care, we have created designated Provider Care Teams.  These Care Teams include your primary Cardiologist (physician) and Advanced Practice Providers (APPs -  Physician Assistants and Nurse Practitioners) who all work together to provide you with the care you need, when you need it.  We recommend signing up for the patient portal called "MyChart".  Sign up information is provided on this After Visit Summary.  MyChart is used to connect with patients for Virtual Visits (Telemedicine).  Patients are able to view lab/test results, encounter notes, upcoming appointments, etc.  Non-urgent messages can be sent to your provider as well.   To learn more about what you can do with MyChart, go to [email or web address].    Your next appointment:   3  month(s)  The format for your next appointment:   In Person  Provider:   Kate Sable, MD   Other Instructions Thank you for choosing [facility]!

## 2020-05-04 ENCOUNTER — Ambulatory Visit (INDEPENDENT_AMBULATORY_CARE_PROVIDER_SITE_OTHER): Payer: Medicare Other

## 2020-05-04 ENCOUNTER — Other Ambulatory Visit: Payer: Self-pay

## 2020-05-04 DIAGNOSIS — R0602 Shortness of breath: Secondary | ICD-10-CM

## 2020-05-04 DIAGNOSIS — R600 Localized edema: Secondary | ICD-10-CM | POA: Diagnosis not present

## 2020-05-05 ENCOUNTER — Telehealth: Payer: Self-pay | Admitting: *Deleted

## 2020-05-05 NOTE — Telephone Encounter (Signed)
Patient informed. Copy sent to PCP °

## 2020-05-05 NOTE — Telephone Encounter (Signed)
-----   Message from Verta Ellen., NP sent at 05/04/2020  8:17 PM EDT ----- Please call the patient and let her know the echocardiograms showed her pumping function has actually improved since the last one in June of last year. Ef is now 60-65% versus 55-60% last year. Her mitral valve has some very mild leaking. Her aortic valve also has some very mild leaking. Tell her this would not explain the lower extremity swelling or SOB. Thanks

## 2020-05-16 ENCOUNTER — Telehealth: Payer: Self-pay | Admitting: Cardiovascular Disease

## 2020-05-16 MED ORDER — SIMVASTATIN 10 MG PO TABS: 10.0000 mg | ORAL_TABLET | Freq: Every evening | ORAL | 1 refills | Status: DC

## 2020-05-16 NOTE — Telephone Encounter (Signed)
Done

## 2020-05-16 NOTE — Telephone Encounter (Signed)
Needing refill on simvastatin (ZOCOR) 10 MG tablet [[id number]]  Sent to the [facility] in [place] on 5/26

## 2020-05-24 ENCOUNTER — Telehealth: Payer: Self-pay | Admitting: *Deleted

## 2020-05-24 NOTE — Telephone Encounter (Signed)
   Primary Cardiologist: Kate Sable, MD  Chart reviewed as part of pre-operative protocol coverage. Recently seen in clinic for LE edema and SOB by Levell July, NP  04/26/20 and follow up echo is reassuring. Patient thinks this is due to her thyroid. Had low risk stress test 05/2019.   Given past medical history and time since last visit, based on ACC/AHA guidelines, Julie Jefferson would be at acceptable risk for the planned procedure without further cardiovascular testing.   I will route this recommendation to the requesting party via Epic fax function and remove from pre-op pool.  Please call with questions.  [place], [place] 05/24/2020, 9:02 AM

## 2020-05-24 NOTE — Telephone Encounter (Signed)
° °  [facility] Pre-operative Risk Assessment    HEARTCARE STAFF: - Please ensure there is not already an duplicate clearance open for this procedure. - Under Visit Info/Reason for Call, type in Other and utilize the format Clearance MM/DD/YY or Clearance TBD. Do not use dashes or single digits. - If request is for dental extraction, please clarify the # of teeth to be extracted.  Request for surgical clearance:  1. What type of surgery is being performed? colonoscopy  2. When is this surgery scheduled? 05/31/2020  3. What type of clearance is required (medical clearance vs. Pharmacy clearance to hold med vs. Both)? medical  4. Are there any medications that need to be held prior to surgery and how long? Request not made  5. Practice name and name of physician performing surgery? Dr. [facility]  6. What is the office phone number? [phone]   7.   What is the office fax number? [phone]  8.   Anesthesia type (None, local, MAC, general) ? Not listed   Marlou Sa 05/24/2020, 7:19 AM  _________________________________________________________________   (provider comments below)

## 2020-05-25 ENCOUNTER — Other Ambulatory Visit: Payer: Medicare Other

## 2020-08-01 ENCOUNTER — Encounter: Payer: Self-pay | Admitting: Family Medicine

## 2020-08-01 ENCOUNTER — Ambulatory Visit (INDEPENDENT_AMBULATORY_CARE_PROVIDER_SITE_OTHER): Payer: Medicare Other | Admitting: Family Medicine

## 2020-08-01 ENCOUNTER — Ambulatory Visit: Payer: Medicare Other | Admitting: Cardiovascular Disease

## 2020-08-01 VITALS — BP 118/72 | HR 60 | Ht 65.0 in | Wt 204.0 lb

## 2020-08-01 DIAGNOSIS — I5181 Takotsubo syndrome: Secondary | ICD-10-CM | POA: Diagnosis not present

## 2020-08-01 DIAGNOSIS — E782 Mixed hyperlipidemia: Secondary | ICD-10-CM

## 2020-08-01 DIAGNOSIS — I10 Essential (primary) hypertension: Secondary | ICD-10-CM

## 2020-08-01 DIAGNOSIS — R0600 Dyspnea, unspecified: Secondary | ICD-10-CM | POA: Diagnosis not present

## 2020-08-01 DIAGNOSIS — R0609 Other forms of dyspnea: Secondary | ICD-10-CM

## 2020-08-01 NOTE — Patient Instructions (Signed)
Medication Instructions:  Continue all current medications.   Labwork: none  Testing/Procedures: none  Follow-Up: 6 months   Any Other Special Instructions Will Be Listed Below (If Applicable).   If you need a refill on your cardiac medications before your next appointment, please call your pharmacy.  

## 2020-08-01 NOTE — Progress Notes (Signed)
Cardiology Office Note  Date: 08/01/2020   ID: Julie Jefferson, An June 21, 1946, MRN [id number]  PCP:  Galen Manila, MD  Cardiologist:  No primary care provider on file. Electrophysiologist:  None   Chief Complaint: F/U Takotsubo's Cardiomyopathy, syncope  History of Present Illness: Julie Jefferson is a 74 y.o. female with a history of Takotsubo Cardiomyopathy, syncope (near syncopal episode 2/2 volume depletion 2014). Cardiac cath 2008 minimal CAD, Echo 2014 EF 60-65% no WMA's  Last encounter with Dr. Bronson Ing 09/11/2018 she had no c/o CP, palpitations, SOB, lightheadedness, dizziness, leg swelling, orthopnea, PND, or syncope. BP's were normal at home. Takotsubo's had resolved with EF improved to 55-60% on echo 05/2019.  At last visit she had been having issues with some mid epigastric discomfort with some radiation.  She stated she had a CT scan ordered by her primary care provider which said she had a mass but it was of no concern.  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  She had been working approximately 50 hours plus per week.  She had been having issues with her thyroid.  She states her thyroid medication had been adjusted up and down by her primary care provider recently.  She denied any classic anginal symptoms such as chest pain, pressure, tightness, radiation to neck, arm, back, jaw.  No nausea, vomiting, or diaphoresis.  No orthostatic symptoms/presyncope or syncopal episodes, no bleeding in stools.  No orthopnea or paroxysmal nocturnal dyspnea.  No claudication-like symptoms or DVT-like symptoms.  She did have mild lower extremity edema which progresses during the day.  She denied any history of venous insufficiency or venous varicosities.  She stated she had some exertional fatigue, more than usual recently.  Stated she hds not  been referred to GI for the mass in her abdomen by PCP.  History of Takotsubo's cardiomyopathy which had resolved.  Cardiac catheterization in 2008 had minimal CAD.  Patient is here for 43-month follow-up.  She states she is feeling much better than during the last visit.  States she started following with an endocrinologist in [place] Dr. Buddy Duty.  States they are attempting to get her thyroid function under reasonable control.  She states the swelling in her lower extremities is better and DOE is much better.  States she has had pneumonia in the interim and still has a lingering cough.  She denies any significant dyspnea on exertion, anginal symptoms, palpitations or arrhythmias, orthostatic symptoms, stroke or TIA-like symptoms, bleeding issues, lower extremity edema, PND, orthopnea.  No orthostatic symptoms or lightheadedness/dizziness.  Past Medical History:  Diagnosis Date  . Aortic atherosclerosis ([place])   . Arthritis    rt knee, back, hands  . Bilateral lower extremity edema   . Breast cancer ([place]) 2005   left  . Chest pain    February, 2014  . Closed right ankle fracture 2016  . Collagen vascular disease ([place])   . Cough    ACE cough 2012  . Ejection fraction    EF 60-65%, echo, February, 2009, (LV function normalized).  . GERD (gastroesophageal reflux disease)   . Glaucoma   . History of anemia    age 62's  . History of breast cancer    left  . HLD (hyperlipidemia)    mixed  . Hypothyroidism   . Pelvic prolapse   . Personal history of chemotherapy 2005  .  Personal history of radiation therapy 2005  . Pre-syncope    Hospital, June, 2014, probably dehydration  . Takotsubo cardiomyopathy 2008   cardiac cath 11/08. minimal coronary disease, anteroseptal akinesis with an EF 55%.  echo 2/09 EF 60-65% (LV function normalized)  . Wears glasses     Past Surgical History:  Procedure Laterality Date  . ANTERIOR AND POSTERIOR REPAIR WITH SACROSPINOUS FIXATION N/A 08/20/2019    Procedure: ANTERIOR AND POSTERIOR REPAIR WITH possible SACROSPINOUS FIXATION;  Surgeon: Everlene Farrier, MD;  Location[facility];  Service: Gynecology;  Laterality: N/A;  need bed  . BACK SURGERY    . BREAST BIOPSY  01/03/2012  . BREAST LUMPECTOMY Left 2005   left breast  . BUNIONECTOMY     left  . CHOLECYSTECTOMY    . COLONOSCOPY    . GANGLION CYST EXCISION     left  . PARTIAL KNEE ARTHROPLASTY Right 12/28/2016   Procedure: RIGHT UNICOMPARTMENTAL KNEE;  Surgeon: Renette Butters, MD;  Location: [facility];  Service: Orthopedics;  Laterality: Right;  . PARTIAL KNEE ARTHROPLASTY Left 09/23/2018   Procedure: UNICOMPARTMENTAL LEFT  KNEE;  Surgeon: Renette Butters, MD;  Location: [facility];  Service: Orthopedics;  Laterality: Left;  . TOTAL ABDOMINAL HYSTERECTOMY  1997  . UPPER GI ENDOSCOPY      Current Outpatient Medications  Medication Sig Dispense Refill  . acetaminophen (TYLENOL) 325 MG tablet Take 2 tablets (650 mg total) by mouth every 6 (six) hours as needed for mild pain (temperature > 101.5.). 60 tablet 0  . amLODipine (NORVASC) 5 MG tablet Take 5 mg by mouth daily.     Marland Kitchen aspirin EC 81 MG tablet Take 1 tablet (81 mg total) by mouth 2 (two) times daily. For DVT prophylaxis for 30 days after surgery. (Patient taking differently: Take 81 mg by mouth every evening. For DVT prophylaxis for 30 days after surgery.) 60 tablet 0  . bimatoprost (LUMIGAN) 0.01 % SOLN Place 1 drop into both eyes at bedtime.     . carvedilol (COREG) 3.125 MG tablet TAKE 1 TABLET BY MOUTH TWICE A DAY 7 tablet 0  . levothyroxine (SYNTHROID) 137 MCG tablet Take 137 mcg by mouth daily.    . meclizine (ANTIVERT) 25 MG tablet Take 25 mg by mouth 3 (three) times daily as needed for dizziness.     . Omeprazole (PRILOSEC PO) Take 80 mg by mouth daily.    Marland Kitchen OVER THE COUNTER MEDICATION Take 1 capsule by mouth 2 (two) times daily. [employer]    . polyethylene glycol powder  (GLYCOLAX/MIRALAX) powder Take 17 g by mouth daily as needed for mild constipation.     . simvastatin (ZOCOR) 10 MG tablet Take 1 tablet (10 mg total) by mouth every evening. 90 tablet 1  . sucralfate (CARAFATE) 1 g tablet Take 1 g by mouth 2 (two) times daily.    . timolol (BETIMOL) 0.5 % ophthalmic solution Place 1 drop into both eyes 2 (two) times daily.      No current facility-administered medications for this visit.   Allergies:  Alphagan [brimonidine], Dorzolamide hcl, Penicillins, and Latex   Social History: The patient  reports that she has never smoked. She has never used smokeless tobacco. She reports that she does not drink alcohol and does not use drugs.   Family History: The patient's family history includes Breast cancer in her maternal aunt, mother, and sister; Cancer in her sister; Cancer (age of onset: 66) in  her maternal aunt and mother; Coronary artery disease in an other family member; Heart disease in her father.   ROS:  Please see the history of present illness. Otherwise, complete review of systems is positive for none.  All other systems are reviewed and negative.   Physical Exam: VS:  BP 118/72   Pulse 60   Ht 5\' 5"  (1.651 m)   Wt 204 lb (92.5 kg)   SpO2 99%   BMI 33.95 kg/m , BMI Body mass index is 33.95 kg/m.  Wt Readings from Last 3 Encounters:  08/01/20 204 lb (92.5 kg)  04/27/20 208 lb (94.3 kg)  08/20/19 189 lb 5 oz (85.9 kg)    General: Patient appears comfortable at rest. Neck: Supple, no elevated JVP or carotid bruits, no thyromegaly. Lungs: Clear to auscultation, nonlabored breathing at rest. Cardiac: Regular rate and rhythm, no S3 or significant systolic murmur, no pericardial rub. Extremities:  No pitting edema, distal pulses 2+. Skin: Warm and dry. Musculoskeletal: No kyphosis. Neuropsychiatric: Alert and oriented x3, affect grossly appropriate.  ECG:  An ECG dated 04/27/2020 was personally reviewed today and demonstrated:  Normal sinus  rhythm rate of 60  Recent Labwork: 08/17/2019: ALT 30; AST 30; BUN 17; Creatinine, Ser 0.74; Potassium 4.6; Sodium 139 08/21/2019: Hemoglobin 11.0; Platelets 269     Component Value Date/Time   CHOL  10/25/2007 0058    161        ATP III CLASSIFICATION:  <200     mg/dL   Desirable  200-239  mg/dL   Borderline High  >=240    mg/dL   High   TRIG 53 10/25/2007 0058   HDL 70 10/25/2007 0058   CHOLHDL 2.3 10/25/2007 0058   VLDL 11 10/25/2007 0058   LDLCALC  10/25/2007 0058    80        Total Cholesterol/HDL:CHD Risk Coronary Heart Disease Risk Table                     Men   Women  1/2 Average Risk   3.4   3.3    Other Studies Reviewed Today:   Echocardiogram 05/04/2020 1. Left ventricular ejection fraction, by estimation, is 60 to 65%. The  left ventricle has normal function. The left ventricle has no regional  wall motion abnormalities. Left ventricular diastolic parameters are  indeterminate.  2. Right ventricular systolic function is normal. The right ventricular  size is normal. Tricuspid regurgitation signal is inadequate for assessing  PA pressure.  3. The mitral valve is grossly normal. Trivial mitral valve  regurgitation.  4. The aortic valve is tricuspid. Aortic valve regurgitation is trivial.  5. The inferior vena cava is normal in size with greater than 50%  respiratory variability, suggesting right atrial pressure of 3 mmHg.   MPI Stress test 05/14/2019  No diagnostic ST segment changes to indicate ischemia.  Small, mild intensity, partially reversible apical anteroseptal defect that is most consistent with variable soft tissue attenuation. No large ischemic territories noted.  This is a low risk study.  Nuclear stress EF: 71%.  Echocardiogram 05/14/2019 IMPRESSIONS  1. The left ventricle has normal systolic function, with an ejection  fraction of 55-60%. The cavity size was normal. Left ventricular diastolic  parameters were normal. No evidence of left  ventricular regional wall  motion abnormalities.  2. The right ventricle has normal systolic function. The cavity was  normal. There is no increase in right ventricular wall thickness. Right  ventricular systolic  pressure could not be assessed.  3. The aortic valve is tricuspid. Mild aortic annular calcification  noted.  4. The mitral valve is grossly normal.  5. The tricuspid valve is grossly normal.  6. The aortic root is normal in size and structure.   Assessment and Plan:   1. Takotsubo syndrome History of Takotsubo's which has resolved.  2. Essential hypertension Blood pressure 118/72 today.  Continue amlodipine 5 mg, carvedilol 3.125 mg p.o. twice daily,   3. Mixed hyperlipidemia Continue simvastatin 10 mg daily.  Follows with PCP for lipid management.  4.  Dyspnea on exertion with leg edema At last visit patient stated she had been having increasing dyspnea over the previous couple of months with increasing leg edema.  Today she states her dyspnea on exertion is much better along with her lower extremity edema.  Both of these symptoms have improved.  She states she believes the symptoms were more related to her thyroid problems than to her heart.  She is now seeing an endocrinologist in [place] who  recently adjusted her thyroid medication and she feels much better.  Repeat echocardiogram demonstrated EF of 60 to 65%, no WMA's, trivial MR trivial AR   Medication Adjustments/Labs and Tests Ordered: Current medicines are reviewed at length with the patient today.  Concerns regarding medicines are outlined above.   Disposition: Follow-up with Dr. Domenic Polite or APP 6 months Signed, Levell July, NP 08/01/2020 3:51 PM    [facility] at [place], [place], [place] [address] Phone: [phone]; Fax: [phone]

## 2020-08-07 DIAGNOSIS — R002 Palpitations: Secondary | ICD-10-CM

## 2020-08-10 NOTE — Telephone Encounter (Signed)
Did have her give you a monitor duration?  If not make it 30 days.  Thanks

## 2020-08-22 ENCOUNTER — Ambulatory Visit: Payer: Medicare Other

## 2020-08-22 DIAGNOSIS — R002 Palpitations: Secondary | ICD-10-CM

## 2020-10-01 ENCOUNTER — Other Ambulatory Visit: Payer: Self-pay

## 2020-10-01 ENCOUNTER — Emergency Department (HOSPITAL_COMMUNITY)
Admission: EM | Admit: 2020-10-01 | Discharge: 2020-10-02 | Disposition: A | Discharge status: Home / Self Care | Payer: Medicare Other | Attending: Emergency Medicine | Admitting: Emergency Medicine

## 2020-10-01 DIAGNOSIS — R002 Palpitations: Secondary | ICD-10-CM | POA: Diagnosis present

## 2020-10-01 DIAGNOSIS — R0602 Shortness of breath: Secondary | ICD-10-CM | POA: Diagnosis not present

## 2020-10-01 DIAGNOSIS — I10 Essential (primary) hypertension: Secondary | ICD-10-CM | POA: Diagnosis not present

## 2020-10-01 DIAGNOSIS — Z91040 Latex allergy status: Secondary | ICD-10-CM | POA: Diagnosis not present

## 2020-10-01 DIAGNOSIS — E039 Hypothyroidism, unspecified: Secondary | ICD-10-CM | POA: Diagnosis not present

## 2020-10-01 DIAGNOSIS — R000 Tachycardia, unspecified: Secondary | ICD-10-CM | POA: Diagnosis not present

## 2020-10-01 DIAGNOSIS — Z96651 Presence of right artificial knee joint: Secondary | ICD-10-CM | POA: Diagnosis not present

## 2020-10-01 DIAGNOSIS — Z79899 Other long term (current) drug therapy: Secondary | ICD-10-CM | POA: Diagnosis not present

## 2020-10-01 DIAGNOSIS — Z853 Personal history of malignant neoplasm of breast: Secondary | ICD-10-CM | POA: Diagnosis not present

## 2020-10-02 ENCOUNTER — Emergency Department (HOSPITAL_COMMUNITY): Payer: Medicare Other

## 2020-10-02 ENCOUNTER — Encounter (HOSPITAL_COMMUNITY): Payer: Self-pay | Admitting: Emergency Medicine

## 2020-10-02 ENCOUNTER — Other Ambulatory Visit: Payer: Self-pay

## 2020-10-02 DIAGNOSIS — R002 Palpitations: Secondary | ICD-10-CM | POA: Diagnosis not present

## 2020-10-02 LAB — BASIC METABOLIC PANEL
Anion gap: 13 (ref 5–15)
BUN: 19 mg/dL (ref 8–23)
CO2: 22 mmol/L (ref 22–32)
Calcium: 9.8 mg/dL (ref 8.9–10.3)
Chloride: 106 mmol/L (ref 98–111)
Creatinine, Ser: 0.77 mg/dL (ref 0.44–1.00)
GFR, Estimated: >60 mL/min (ref >60)
Glucose, Bld: 113 mg/dL — ABNORMAL HIGH (ref 70–99)
Potassium: 3.4 mmol/L — ABNORMAL LOW (ref 3.5–5.1)
Sodium: 141 mmol/L (ref 135–145)

## 2020-10-02 LAB — CBC
HCT: 38.5 % (ref 36.0–46.0)
Hemoglobin: 12.2 g/dL (ref 12.0–15.0)
MCH: 27 pg (ref 26.0–34.0)
MCHC: 31.7 g/dL (ref 30.0–36.0)
MCV: 85.2 fL (ref 80.0–100.0)
Platelets: 362 10*3/uL (ref 150–400)
RBC: 4.52 MIL/uL (ref 3.87–5.11)
RDW: 14.8 % (ref 11.5–15.5)
WBC: 6.9 10*3/uL (ref 4.0–10.5)
nRBC: 0 % (ref 0.0–0.2)

## 2020-10-02 LAB — TROPONIN I (HIGH SENSITIVITY)
Troponin I (High Sensitivity): 10 ng/L (ref <18)
Troponin I (High Sensitivity): 15 ng/L (ref <18)

## 2020-10-02 MED ORDER — POTASSIUM CHLORIDE CRYS ER 20 MEQ PO TBCR
40.0000 meq | EXTENDED_RELEASE_TABLET | Freq: Once | ORAL | Status: AC
  Administered 2020-10-02: 40 meq via ORAL
  Filled 2020-10-02: qty 2

## 2020-10-02 NOTE — ED Provider Notes (Signed)
[facility] EMERGENCY DEPARTMENT Provider Note   CSN: [id number] Arrival date & time: 10/01/20  2325     History Chief Complaint  Patient presents with  . Palpitations  . Shortness of Breath    Julie Jefferson is a 74 y.o. female.  HPI   Pt felt her heart racing and had tightness in her chest.  It lasted until 3 am.  She felt it also skipping beats when it occurred.  Pt felt that by the time she came in had slowed down.  Pt felt like for the first hour it was fast and then started to slow down.  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  Pt now feels like she has been working all night.    Past Medical History:  Diagnosis Date  . Aortic atherosclerosis ([place])   . Arthritis    rt knee, back, hands  . Bilateral lower extremity edema   . Breast cancer ([place]) 2005   left  . Chest pain    February, 2014  . Closed right ankle fracture 2016  . Collagen vascular disease ([place])   . Cough    ACE cough 2012  . Ejection fraction    EF 60-65%, echo, February, 2009, (LV function normalized).  . GERD (gastroesophageal reflux disease)   . Glaucoma   . History of anemia    age 67's  . History of breast cancer    left  . HLD (hyperlipidemia)    mixed  . Hypothyroidism   . Pelvic prolapse   . Personal history of chemotherapy 2005  . Personal history of radiation therapy 2005  . Pre-syncope    Hospital, June, 2014, probably dehydration  . Takotsubo cardiomyopathy 2008   cardiac cath 11/08. minimal coronary disease, anteroseptal akinesis with an EF 55%.  echo 2/09 EF 60-65% (LV function normalized)  . Wears glasses     Patient Active Problem List   Diagnosis Date Noted  . Pelvic prolapse 08/20/2019  . Primary osteoarthritis of knee 09/23/2018  . Primary osteoarthritis of left knee 09/10/2018  . Primary osteoarthritis of right knee 12/28/2016  . Knee injury 12/28/2016  . Glaucoma 11/17/2013  . Pre-syncope 05/12/2013  . Precordial pain 05/12/2013  . Near  syncope - possible dehydration 05/12/2013  . Breast cancer ([place]) 04/20/2013  . Family history of malignant neoplasm of breast 04/20/2013  . Left breast lump 01/02/2012  . Cough   . Takotsubo syndrome   . HTN (hypertension)   . HLD (hyperlipidemia)   . Hypothyroidism   . GERD (gastroesophageal reflux disease)   . Ejection fraction   . DIAPHRAGMAT HERN W/O MENTION OBSTRUCTION/GANGREN 10/23/2010    Past Surgical History:  Procedure Laterality Date  . ANTERIOR AND POSTERIOR REPAIR WITH SACROSPINOUS FIXATION N/A 08/20/2019   Procedure: ANTERIOR AND POSTERIOR REPAIR WITH possible SACROSPINOUS FIXATION;  Surgeon: Everlene Farrier, MD;  Location[facility];  Service: Gynecology;  Laterality: N/A;  need bed  . BACK SURGERY    . BREAST BIOPSY  01/03/2012  . BREAST LUMPECTOMY Left 2005   left breast  . BUNIONECTOMY     left  . CHOLECYSTECTOMY    . COLONOSCOPY    . GANGLION CYST EXCISION     left  . PARTIAL KNEE ARTHROPLASTY Right 12/28/2016   Procedure: RIGHT UNICOMPARTMENTAL KNEE;  Surgeon: Renette Butters, MD;  Location: [facility];  Service: Orthopedics;  Laterality: Right;  . PARTIAL KNEE ARTHROPLASTY Left 09/23/2018   Procedure: UNICOMPARTMENTAL LEFT  KNEE;  Surgeon: Renette Butters, MD;  Location: [facility];  Service: Orthopedics;  Laterality: Left;  . TOTAL ABDOMINAL HYSTERECTOMY  1997  . UPPER GI ENDOSCOPY       OB History   No obstetric history on file.     Family History  Problem Relation Age of Onset  . Cancer Mother 32       breast  . Breast cancer Mother   . Heart disease Father   . Coronary artery disease Other        family hx  . Cancer Sister        LCIS 42  . Breast cancer Sister   . Cancer Maternal Aunt 33       breast  . Breast cancer Maternal Aunt     Social History   Tobacco Use  . Smoking status: Never Smoker  . Smokeless tobacco: Never Used  . Tobacco comment: tobacco use - no   Vaping Use  . Vaping Use: Never  used  Substance Use Topics  . Alcohol use: No    Alcohol/week: 0.0 standard drinks  . Drug use: No    Home Medications Prior to Admission medications   Medication Sig Start Date End Date Taking? Authorizing Provider  acetaminophen (TYLENOL) 325 MG tablet Take 2 tablets (650 mg total) by mouth every 6 (six) hours as needed for mild pain (temperature > 101.5.). 08/21/19   Everlene Farrier, MD  amLODipine (NORVASC) 5 MG tablet Take 5 mg by mouth daily.  10/03/11   [provider]  aspirin EC 81 MG tablet Take 1 tablet (81 mg total) by mouth 2 (two) times daily. For DVT prophylaxis for 30 days after surgery. Patient taking differently: Take 81 mg by mouth every evening. For DVT prophylaxis for 30 days after surgery. 09/23/18   Martensen, Charna Elizabeth III, PA-C  bimatoprost (LUMIGAN) 0.01 % SOLN Place 1 drop into both eyes at bedtime.     [provider]  carvedilol (COREG) 3.125 MG tablet TAKE 1 TABLET BY MOUTH TWICE A DAY 03/07/20   Verta Ellen., NP  levothyroxine (SYNTHROID) 137 MCG tablet Take 137 mcg by mouth daily. 04/25/20   [provider]  meclizine (ANTIVERT) 25 MG tablet Take 25 mg by mouth 3 (three) times daily as needed for dizziness.     [provider]  Omeprazole (PRILOSEC PO) Take 80 mg by mouth daily.    [provider]  OVER THE COUNTER MEDICATION Take 1 capsule by mouth 2 (two) times daily. [facility] Supplement    [provider]  polyethylene glycol powder (GLYCOLAX/MIRALAX) powder Take 17 g by mouth daily as needed for mild constipation.     [provider]  simvastatin (ZOCOR) 10 MG tablet Take 1 tablet (10 mg total) by mouth every evening. 05/16/20   Herminio Commons, MD  sucralfate (CARAFATE) 1 g tablet Take 1 g by mouth 2 (two) times daily.    [provider]  timolol (BETIMOL) 0.5 % ophthalmic solution Place 1 drop into both eyes 2 (two) times daily.     [provider]    Allergies      Alphagan [brimonidine], Dorzolamide hcl, Penicillins, and Latex  Review of Systems   Review of Systems  Gastrointestinal: Positive for nausea.  All other systems reviewed and are negative.   Physical Exam Updated Vital Signs BP (!) 139/99   Pulse 64   Temp 98.3 F (36.8 C) (Oral)   Resp 12  SpO2 97%   Physical Exam Vitals and nursing note reviewed.  Constitutional:      General: She is not in acute distress.    Appearance: She is well-developed.  HENT:     Head: Normocephalic and atraumatic.     Right Ear: External ear normal.     Left Ear: External ear normal.  Eyes:     General: No scleral icterus.       Right eye: No discharge.        Left eye: No discharge.     Conjunctiva/sclera: Conjunctivae normal.  Neck:     Trachea: No tracheal deviation.  Cardiovascular:     Rate and Rhythm: Normal rate and regular rhythm.  Pulmonary:     Effort: Pulmonary effort is normal. No respiratory distress.     Breath sounds: Normal breath sounds. No stridor. No wheezing or rales.  Abdominal:     General: Bowel sounds are normal. There is no distension.     Palpations: Abdomen is soft.     Tenderness: There is no abdominal tenderness. There is no guarding or rebound.  Musculoskeletal:        General: No tenderness.     Cervical back: Neck supple.  Skin:    General: Skin is warm and dry.     Findings: No rash.  Neurological:     Mental Status: She is alert.     Cranial Nerves: No cranial nerve deficit (no facial droop, extraocular movements intact, no slurred speech).     Sensory: No sensory deficit.     Motor: No abnormal muscle tone or seizure activity.     Coordination: Coordination normal.     ED Results / Procedures / Treatments   Labs (all labs ordered are listed, but only abnormal results are displayed) Labs Reviewed  BASIC METABOLIC PANEL - Abnormal; Notable for the following components:      Result Value   Potassium 3.4 (*)    Glucose, Bld 113 (*)    All  other components within normal limits  CBC  TROPONIN I (HIGH SENSITIVITY)  TROPONIN I (HIGH SENSITIVITY)    EKG EKG Interpretation  Date/Time:  Sunday October 02 2020 08:49:43 EDT Ventricular Rate:  63 PR Interval:    QRS Duration: 99 QT Interval:  442 QTC Calculation: 453 R Axis:   54 Text Interpretation: Sinus rhythm Low voltage, precordial leads tachycardic rhythm (EKG not in muse) resolved Reconfirmed by Dorie Rank [phone]) on 10/02/2020 8:59:00 AM   Patient's first EKG was not showing up in use but was physically reviewed.  Patient had evidence of narrow complex tachycardia with some irregularity.  Suspect A. fib with RVR versus SVT.  More likely the former.  Radiology DG Chest 2 View  Result Date: 10/02/2020 CLINICAL DATA:  Shortness of breath. EXAM: CHEST - 2 VIEW COMPARISON:  None. FINDINGS: The heart size and mediastinal contours are within normal limits. Both lungs are clear. The visualized skeletal structures are unremarkable. Radiopaque surgical clips are seen overlying the lateral aspect of the mid left hemithorax and right upper quadrant. IMPRESSION: No active cardiopulmonary disease. Electronically Signed   By: Virgina Norfolk M.D.   On: 10/02/2020 01:18    Procedures Procedures (including critical care time)  Medications Ordered in ED Medications  potassium chloride SA (KLOR-CON) CR tablet 40 mEq (40 mEq Oral Given 10/02/20 0850)    ED Course  I have reviewed the triage vital signs and the nursing notes.  Pertinent labs & imaging results  that were available during my care of the patient were reviewed by me and considered in my medical decision making (see chart for details).    MDM Rules/Calculators/A&P                          Patient presented to the ED for evaluation of palpitations associated with chest heaviness.  While the patient was waiting for evaluation she did have her serial cardiac enzymes and all of her symptoms spontaneously resolved.   Laboratory tests are reassuring.  No signs to suggest acute cardiac injury.  Doubt ACS.  On my exam the patient is comfortable and in no distress.  She has a normal heart rhythm at this time.  Her vitals are normal.  The patient's initial EKG did show a tachycardic rhythm.  Question whether this was atrial fibrillation versus SVT.  Suspect the former although I will have her follow-up with her cardiologist for further evaluation.  Patient states she has been seeing her cardiologist and also had an outpatient cardiac monitor.  She is not sure of those results.  We will hold off on any anticoagulation until her cardiologist can review her outpatient monitoring and EKG. Final Clinical Impression(s) / ED Diagnoses Final diagnoses:  Tachycardia, unspecified  Palpitations    Rx / DC Orders ED Discharge Orders    None       Dorie Rank, MD 10/02/20 [phone]

## 2020-10-02 NOTE — ED Triage Notes (Signed)
Pt from home, reports palpitations that started at 9pm accompanied by a feeling of heaviness. Reports SOB that started around the same time.  Pt has had her medications adjusted recently.

## 2020-10-02 NOTE — Discharge Instructions (Addendum)
Your initial EKG did show the abnormal heart rhythm that was causing your palpitations.  It resolved spontaneously.  Follow up with your cardiologist as we discussed.  Return to the ED for recurrent palpitations that do not resolve within 15 minutes

## 2020-10-05 NOTE — Progress Notes (Signed)
Cardiology Office Note  Date: 10/06/2020   ID: Julie Jefferson, [place] November 13, 1946, MRN [id number]  PCP:  Galen Manila, MD  Cardiologist:  No primary care provider on file. Electrophysiologist:  None   Chief Complaint: F/U Takotsubo's Cardiomyopathy, syncope  History of Present Illness: Julie Jefferson is a 74 y.o. female with a history of Takotsubo Cardiomyopathy, syncope (near syncopal episode 2/2 volume depletion 2014). Cardiac cath 2008 minimal CAD, Echo 2014 EF 60-65% no WMA's  Last encounter with Dr. Bronson Ing 09/11/2018 she had no c/o CP, palpitations, SOB, lightheadedness, dizziness, leg swelling, orthopnea, PND, or syncope. BP's were normal at home. Takotsubo's had resolved with EF improved to 55-60% on echo 05/2019.  At last visit she had been having issues with some mid epigastric discomfort with some radiation.  She stated she had a CT scan ordered by her primary care provider which said she had a mass but it was of no concern.  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  She had been working approximately 50 hours plus per week.  She had been having issues with her thyroid.  She states her thyroid medication had been adjusted up and down by her primary care provider recently.  She denied any classic anginal symptoms such as chest pain, pressure, tightness, radiation to neck, arm, back, jaw.  No nausea, vomiting, or diaphoresis.  No orthostatic symptoms/presyncope or syncopal episodes, no bleeding in stools.  No orthopnea or paroxysmal nocturnal dyspnea.  No claudication-like symptoms or DVT-like symptoms.  She did have mild lower extremity edema which progresses during the day.  She denied any history of venous insufficiency or venous varicosities.  She stated she had some exertional fatigue, more than usual recently.  Stated she hds not  been referred to GI for the mass in her abdomen by PCP.  History of Takotsubo's cardiomyopathy which had resolved.  Cardiac catheterization in 2008 had minimal CAD.  At last visit patient was here for 44-month follow-up.  She stated she was feeling much better than during the prior visit.  States she started following with an endocrinologist in [place] Dr. Buddy Duty.  She stated endocrinologist was attempting to get her thyroid function under reasonable control.  She stated the swelling in her lower extremities and DOE was much better.  She had pneumonia in the interim and still had a lingering cough.  She denied any significant dyspnea on exertion, anginal symptoms, palpitations or arrhythmias, orthostatic symptoms, stroke or TIA-like symptoms, bleeding issues, lower extremity edema, PND, orthopnea.  No orthostatic symptoms or lightheadedness/dizziness.  Patient is here for follow-up status post recent emergency room visit to [facility] on October 01, 2020 with complaints of palpitations/racing heart.  She had an initial EKG at 2331 October 01, 2020 with evidence of atrial fibrillation with RVR rate of 143.  Atrial fibrillation apparently resolved and discharge EKG on October 31 at 08 49 showed sinus rhythm rate of 63.  She has been having issues with her thyroid recently with getting the thyroid levels adjusted.  She states she believes this irregular rhythm may be due to fluctuations in thyroid hormone levels.  She is seeing Dr. Buddy Duty in [place] for management.  She had a previous 30-day monitor which showed virtually no evidence of atrial fibrillation except for graphic representation of rhythm strip on day 29 or September 18, 2020 with a rapid heart  rate which was irregular.  The monitor itself did not detect atrial fibrillation.  She denies any further palpitations since going to the emergency room.  Currently she denies any anginal or exertional symptoms, palpitations or arrhythmias, orthostatic symptoms,  CVA or TIA-like symptoms, PND, orthopnea.  Past Medical History:  Diagnosis Date  . Aortic atherosclerosis ([place])   . Arthritis    rt knee, back, hands  . Bilateral lower extremity edema   . Breast cancer ([place]) 2005   left  . Chest pain    February, 2014  . Closed right ankle fracture 2016  . Collagen vascular disease ([place])   . Cough    ACE cough 2012  . Ejection fraction    EF 60-65%, echo, February, 2009, (LV function normalized).  . GERD (gastroesophageal reflux disease)   . Glaucoma   . History of anemia    age 5's  . History of breast cancer    left  . HLD (hyperlipidemia)    mixed  . Hypothyroidism   . Pelvic prolapse   . Personal history of chemotherapy 2005  . Personal history of radiation therapy 2005  . Pre-syncope    Hospital, June, 2014, probably dehydration  . Takotsubo cardiomyopathy 2008   cardiac cath 11/08. minimal coronary disease, anteroseptal akinesis with an EF 55%.  echo 2/09 EF 60-65% (LV function normalized)  . Wears glasses     Past Surgical History:  Procedure Laterality Date  . ANTERIOR AND POSTERIOR REPAIR WITH SACROSPINOUS FIXATION N/A 08/20/2019   Procedure: ANTERIOR AND POSTERIOR REPAIR WITH possible SACROSPINOUS FIXATION;  Surgeon: Everlene Farrier, MD;  [facility]  Service: Gynecology;  Laterality: N/A;  need bed  . BACK SURGERY    . BREAST BIOPSY  01/03/2012  . BREAST LUMPECTOMY Left 2005   left breast  . BUNIONECTOMY     left  . CHOLECYSTECTOMY    . COLONOSCOPY    . GANGLION CYST EXCISION     left  . PARTIAL KNEE ARTHROPLASTY Right 12/28/2016   Procedure: RIGHT UNICOMPARTMENTAL KNEE;  Surgeon: Renette Butters, MD;  Location: [facility];  Service: Orthopedics;  Laterality: Right;  . PARTIAL KNEE ARTHROPLASTY Left 09/23/2018   Procedure: UNICOMPARTMENTAL LEFT  KNEE;  Surgeon: Renette Butters, MD;  Location: [facility];  Service: Orthopedics;  Laterality: Left;  . TOTAL ABDOMINAL HYSTERECTOMY   1997  . UPPER GI ENDOSCOPY      Current Outpatient Medications  Medication Sig Dispense Refill  . acetaminophen (TYLENOL) 325 MG tablet Take 2 tablets (650 mg total) by mouth every 6 (six) hours as needed for mild pain (temperature > 101.5.). 60 tablet 0  . amLODipine (NORVASC) 5 MG tablet Take 5 mg by mouth daily.     Marland Kitchen aspirin EC 81 MG tablet Take 1 tablet (81 mg total) by mouth 2 (two) times daily. For DVT prophylaxis for 30 days after surgery. (Patient taking differently: Take 81 mg by mouth every evening. For DVT prophylaxis for 30 days after surgery.) 60 tablet 0  . bimatoprost (LUMIGAN) 0.01 % SOLN Place 1 drop into both eyes at bedtime.     . carvedilol (COREG) 3.125 MG tablet TAKE 1 TABLET BY MOUTH TWICE A DAY 7 tablet 0  . levothyroxine (SYNTHROID) 137 MCG tablet Take 137 mcg by mouth daily.    . meclizine (ANTIVERT) 25 MG tablet Take 25 mg by mouth 3 (three) times daily as needed for dizziness.     . Omeprazole (PRILOSEC PO)  Take 80 mg by mouth daily.    Marland Kitchen OVER THE COUNTER MEDICATION Take 1 capsule by mouth 2 (two) times daily. [employer]    . polyethylene glycol powder (GLYCOLAX/MIRALAX) powder Take 17 g by mouth daily as needed for mild constipation.     . simvastatin (ZOCOR) 10 MG tablet Take 1 tablet (10 mg total) by mouth every evening. 90 tablet 1  . sucralfate (CARAFATE) 1 g tablet Take 1 g by mouth 2 (two) times daily.    . timolol (BETIMOL) 0.5 % ophthalmic solution Place 1 drop into both eyes 2 (two) times daily.      No current facility-administered medications for this visit.   Allergies:  Alphagan [brimonidine], Dorzolamide hcl, Penicillins, and Latex   Social History: The patient  reports that she has never smoked. She has never used smokeless tobacco. She reports that she does not drink alcohol and does not use drugs.   Family History: The patient's family history includes Breast cancer in her maternal aunt, mother, and sister; Cancer in her sister;  Cancer (age of onset: 59) in her maternal aunt and mother; Coronary artery disease in an other family member; Heart disease in her father.   ROS:  Please see the history of present illness. Otherwise, complete review of systems is positive for none.  All other systems are reviewed and negative.   Physical Exam: VS:  BP (!) 148/78   Pulse 68   Ht 5' 5.5" (1.664 m)   Wt 208 lb (94.3 kg)   SpO2 92%   BMI 34.09 kg/m , BMI Body mass index is 34.09 kg/m.  Wt Readings from Last 3 Encounters:  10/06/20 208 lb (94.3 kg)  08/01/20 204 lb (92.5 kg)  04/27/20 208 lb (94.3 kg)    General: Patient appears comfortable at rest. Neck: Supple, no elevated JVP or carotid bruits, no thyromegaly. Lungs: Clear to auscultation, nonlabored breathing at rest. Cardiac: Regular rate and rhythm, no S3 or significant systolic murmur, no pericardial rub. Extremities:  No pitting edema, distal pulses 2+. Skin: Warm and dry. Musculoskeletal: No kyphosis. Neuropsychiatric: Alert and oriented x3, affect grossly appropriate.  ECG:  An ECG dated 04/27/2020 was personally reviewed today and demonstrated:  Normal sinus rhythm rate of 60  Recent Labwork: 10/02/2020: BUN 19; Creatinine, Ser 0.77; Hemoglobin 12.2; Platelets 362; Potassium 3.4; Sodium 141     Component Value Date/Time   CHOL  10/25/2007 0058    161        ATP III CLASSIFICATION:  <200     mg/dL   Desirable  200-239  mg/dL   Borderline High  >=240    mg/dL   High   TRIG 53 10/25/2007 0058   HDL 70 10/25/2007 0058   CHOLHDL 2.3 10/25/2007 0058   VLDL 11 10/25/2007 0058   LDLCALC  10/25/2007 0058    80        Total Cholesterol/HDL:CHD Risk Coronary Heart Disease Risk Table                     Men   Women  1/2 Average Risk   3.4   3.3    Other Studies Reviewed Today:  Day 29 of cardiac monitor 09/18/2020     Echocardiogram 05/04/2020 1. Left ventricular ejection fraction, by estimation, is 60 to 65%. The  left ventricle has normal  function. The left ventricle has no regional  wall motion abnormalities. Left ventricular diastolic parameters are  indeterminate.  2.  Right ventricular systolic function is normal. The right ventricular  size is normal. Tricuspid regurgitation signal is inadequate for assessing  PA pressure.  3. The mitral valve is grossly normal. Trivial mitral valve  regurgitation.  4. The aortic valve is tricuspid. Aortic valve regurgitation is trivial.  5. The inferior vena cava is normal in size with greater than 50%  respiratory variability, suggesting right atrial pressure of 3 mmHg.   MPI Stress test 05/14/2019  No diagnostic ST segment changes to indicate ischemia.  Small, mild intensity, partially reversible apical anteroseptal defect that is most consistent with variable soft tissue attenuation. No large ischemic territories noted.  This is a low risk study.  Nuclear stress EF: 71%.  Echocardiogram 05/14/2019 IMPRESSIONS  1. The left ventricle has normal systolic function, with an ejection  fraction of 55-60%. The cavity size was normal. Left ventricular diastolic  parameters were normal. No evidence of left ventricular regional wall  motion abnormalities.  2. The right ventricle has normal systolic function. The cavity was  normal. There is no increase in right ventricular wall thickness. Right  ventricular systolic pressure could not be assessed.  3. The aortic valve is tricuspid. Mild aortic annular calcification  noted.  4. The mitral valve is grossly normal.  5. The tricuspid valve is grossly normal.  6. The aortic root is normal in size and structure.   Assessment and Plan:  1.  PAF/palpitations Recent visit to the emergency room on October 30 with rapid heart rhythm.  Evidence of atrial fibrillation RVR on EKG rate of 143.  This self resolved and converted back to normal sinus rhythm which was evident on EKG at discharge at 0849 October 02, 2020 showing normal sinus  rhythm with a rate of 63.  Stop carvedilol.  Start metoprolol 12.5 mg p.o. twice daily.  Start Eliquis 5 mg p.o. twice daily.  A 30-day monitor was placed previously which showed no evidence of atrial fibrillation except for 1 strip which did not register on the preventis monitor as atrial fibrillation on day 29 September 18, 2020.  Strip as noted above under other studies reviewed.  2. Essential hypertension BP 148/78 today..  Continue amlodipine 5 mg.  We are stopping carvedilol and starting metoprolol 12.5 mg p.o. twice daily for PAF.  Hopefully this will help with the blood pressure also.  3. Mixed hyperlipidemia Continue simvastatin 10 mg daily.  Follows with PCP for lipid management.  4.  Dyspnea on exertion with leg edema Currently denies any dyspnea or lower extremity edema.  Repeat echocardiogram demonstrated EF of 60 to 65%, no WMA's, trivial MR trivial AR   Medication Adjustments/Labs and Tests Ordered: Current medicines are reviewed at length with the patient today.  Concerns regarding medicines are outlined above.   Disposition: Follow-up with Dr. Domenic Polite or APP 1 month Signed, Levell July, NP 10/06/2020 3:40 PM    [facility] [address] [place], [place], [place] [address] Phone: [phone]; Fax: [phone]

## 2020-10-06 ENCOUNTER — Ambulatory Visit (INDEPENDENT_AMBULATORY_CARE_PROVIDER_SITE_OTHER): Payer: Medicare Other | Admitting: Family Medicine

## 2020-10-06 ENCOUNTER — Other Ambulatory Visit: Payer: Self-pay

## 2020-10-06 ENCOUNTER — Encounter: Payer: Self-pay | Admitting: Family Medicine

## 2020-10-06 VITALS — BP 148/78 | HR 68 | Ht 65.5 in | Wt 208.0 lb

## 2020-10-06 DIAGNOSIS — R002 Palpitations: Secondary | ICD-10-CM | POA: Diagnosis not present

## 2020-10-06 DIAGNOSIS — R0600 Dyspnea, unspecified: Secondary | ICD-10-CM | POA: Diagnosis not present

## 2020-10-06 DIAGNOSIS — I480 Paroxysmal atrial fibrillation: Secondary | ICD-10-CM | POA: Diagnosis not present

## 2020-10-06 DIAGNOSIS — R0609 Other forms of dyspnea: Secondary | ICD-10-CM

## 2020-10-06 MED ORDER — METOPROLOL TARTRATE 25 MG PO TABS: 12.5000 mg | ORAL_TABLET | Freq: Two times a day (BID) | ORAL | 3 refills | Status: DC

## 2020-10-06 MED ORDER — APIXABAN 5 MG PO TABS: 5.0000 mg | ORAL_TABLET | Freq: Two times a day (BID) | ORAL | 3 refills | Status: DC

## 2020-10-06 NOTE — Patient Instructions (Signed)
Your physician recommends that you schedule a follow-up appointment in: [place], NP  Your physician has recommended you make the following change in your medication:   STOP CARVEDILOL   START METOPROLOL 12.5 MG (1/2 TABLET) TWICE DAILY  START ELIQUIS 5 MG TWICE DAILY    Thank you for choosing [facility]!!

## 2020-10-06 NOTE — Addendum Note (Signed)
Addended by: Julian Hy T on: 10/06/2020 04:56 PM   Modules accepted: Orders

## 2020-11-13 NOTE — Progress Notes (Signed)
Cardiology Office Note  Date: 11/14/2020   ID: Julie Jefferson, Julie Jefferson [birth year], MRN [id number]  PCP:  Galen Manila, MD  Cardiologist:  No primary care provider on file. Electrophysiologist:  None   Chief Complaint: F/U Takotsubo's Cardiomyopathy, syncope  History of Present Illness: Julie Jefferson is a 74 y.o. female with a history of Takotsubo Cardiomyopathy, syncope (near syncopal episode 2/2 volume depletion 2014). Cardiac cath 2008 minimal CAD, Echo 2014 EF 60-65% no WMA's  Last encounter with Dr. Bronson Ing 09/11/2018 she had no c/o CP, palpitations, SOB, lightheadedness, dizziness, leg swelling, orthopnea, PND, or syncope. BP's were normal at home. Takotsubo's had resolved with EF improved to 55-60% on echo 05/2019.  At last visit she had been having issues with some mid epigastric discomfort with some radiation.  She stated she had a CT scan ordered by her primary care provider which said she had a mass but it was of no concern.  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  She had been working approximately 50 hours plus per week.  She had been having issues with her thyroid.  She states her thyroid medication had been adjusted up and down by her primary care provider recently.  She denied any classic anginal symptoms such as chest pain, pressure, tightness, radiation to neck, arm, back, jaw.  No nausea, vomiting, or diaphoresis.  No orthostatic symptoms/presyncope or syncopal episodes, no bleeding in stools.  No orthopnea or paroxysmal nocturnal dyspnea.  No claudication-like symptoms or DVT-like symptoms.  She did have mild lower extremity edema which progresses during the day.  She denied any history of venous insufficiency or venous varicosities.  She stated she had some exertional fatigue, more than usual recently.  Stated she hds not  been referred to GI for the mass in her abdomen by PCP.  History of Takotsubo's cardiomyopathy which had resolved.  Cardiac catheterization in 2008 had minimal CAD.  At last visit patient was here for 36-month follow-up.  She stated she was feeling much better than during the prior visit.  States she started following with an endocrinologist in [place] Dr. Buddy Duty.  She stated endocrinologist was attempting to get her thyroid function under reasonable control.  She stated the swelling in her lower extremities and DOE was much better.  She had pneumonia in the interim and still had a lingering cough.  She denied any significant dyspnea on exertion, anginal symptoms, palpitations or arrhythmias, orthostatic symptoms, stroke or TIA-like symptoms, bleeding issues, lower extremity edema, PND, orthopnea.  No orthostatic symptoms or lightheadedness/dizziness.  Patient was here last visit on 10/06/2020 for follow-up status post recent emergency room visit to [facility] on October 01, 2020 with complaints of palpitations/racing heart.  She had an initial EKG at 2331 October 01, 2020 with evidence of atrial fibrillation with RVR rate of 143.  Atrial fibrillation apparently resolved and discharge EKG on October 31 at 08 49 showed sinus rhythm rate of 63.  Was having issues with her thyroid recently with getting the thyroid levels adjusted.  She stated she believed this irregular rhythm may have been due to fluctuations in thyroid hormone levels.  Was seeing Dr. Buddy Duty in [place] for management.  She had a previous 30-day monitor which showed virtually no evidence of atrial fibrillation except for graphic representation of rhythm strip on day 29 or September 18, 2020 with a  rapid heart rate which was irregular.  The monitor itself did not detect atrial fibrillation.  She denies any further palpitations since going to the emergency room.  She denies any anginal or exertional symptoms, palpitations or arrhythmias, orthostatic  symptoms, CVA or TIA-like symptoms, PND, orthopnea.   Patient is here for 1 month follow-up today.  In the interim since last visit she had a fall and impacted her face and head with a large contusion and bruising without fractures.  Daughter who is with her who is a nurse states she believes she may have had a mild concussion associated.  Patient also has some contusions on her left hip and left leg.  Denied any dizziness, lightheadedness, presyncopal or syncopal episode.  Patient states she just stumbled.  She states this is the second time it had occurred.  She presents today states she is feeling okay.  Her heart rate is 47.  She was initially placed on low-dose metoprolol for evidence of atrial fibrillation.  Also on Eliquis.  She denies any bleeding on Eliquis.  No CVA or TIA-like symptoms, PND, orthopnea.  States she continues to have mild transient palpitations which are not bothersome and she is asymptomatic when they occur.  She is working 12-hour days 5 days a week and states she should be retired.  Daughter states her mother needs to quit working but has not had an adequate replacement for her position.  Blood pressure elevated today on arrival 142/70  Past Medical History:  Diagnosis Date  . Aortic atherosclerosis ([place])   . Arthritis    rt knee, back, hands  . Bilateral lower extremity edema   . Breast cancer ([place]) 2005   left  . Chest pain    February, 2014  . Closed right ankle fracture 2016  . Collagen vascular disease ([place])   . Cough    ACE cough 2012  . Ejection fraction    EF 60-65%, echo, February, 2009, (LV function normalized).  . GERD (gastroesophageal reflux disease)   . Glaucoma   . History of anemia    age 25's  . History of breast cancer    left  . HLD (hyperlipidemia)    mixed  . Hypothyroidism   . Pelvic prolapse   . Personal history of chemotherapy 2005  . Personal history of radiation therapy 2005  . Pre-syncope    Hospital, June, 2014, probably  dehydration  . Takotsubo cardiomyopathy 2008   cardiac cath 11/08. minimal coronary disease, anteroseptal akinesis with an EF 55%.  echo 2/09 EF 60-65% (LV function normalized)  . Wears glasses     Past Surgical History:  Procedure Laterality Date  . ANTERIOR AND POSTERIOR REPAIR WITH SACROSPINOUS FIXATION N/A 08/20/2019   Procedure: ANTERIOR AND POSTERIOR REPAIR WITH possible SACROSPINOUS FIXATION;  Surgeon: Everlene Farrier, MD;  Location: [facility];  Service: Gynecology;  Laterality: N/A;  need bed  . BACK SURGERY    . BREAST BIOPSY  01/03/2012  . BREAST LUMPECTOMY Left 2005   left breast  . BUNIONECTOMY     left  . CHOLECYSTECTOMY    . COLONOSCOPY    . GANGLION CYST EXCISION     left  . PARTIAL KNEE ARTHROPLASTY Right 12/28/2016   Procedure: RIGHT UNICOMPARTMENTAL KNEE;  Surgeon: Renette Butters, MD;  Location: [place];  Service: Orthopedics;  Laterality: Right;  . PARTIAL KNEE ARTHROPLASTY Left 09/23/2018   Procedure: UNICOMPARTMENTAL LEFT  KNEE;  Surgeon: Renette Butters, MD;  Location:  [facility];  Service: Orthopedics;  Laterality: Left;  . TOTAL ABDOMINAL HYSTERECTOMY  1997  . UPPER GI ENDOSCOPY      Current Outpatient Medications  Medication Sig Dispense Refill  . acetaminophen (TYLENOL) 325 MG tablet Take 2 tablets (650 mg total) by mouth every 6 (six) hours as needed for mild pain (temperature > 101.5.). 60 tablet 0  . amLODipine (NORVASC) 5 MG tablet Take 5 mg by mouth daily.     Marland Kitchen apixaban (ELIQUIS) 5 MG TABS tablet Take 1 tablet (5 mg total) by mouth 2 (two) times daily. 60 tablet 3  . bimatoprost (LUMIGAN) 0.01 % SOLN Place 1 drop into both eyes at bedtime.    Marland Kitchen levothyroxine (SYNTHROID) 112 MCG tablet Take 112 mcg by mouth daily before breakfast.    . meclizine (ANTIVERT) 25 MG tablet Take 25 mg by mouth 3 (three) times daily as needed for dizziness.    . Omeprazole (PRILOSEC PO) Take 80 mg by mouth daily.    Marland Kitchen OVER THE COUNTER  MEDICATION Take 1 capsule by mouth 2 (two) times daily. [employer]    . polyethylene glycol powder (GLYCOLAX/MIRALAX) powder Take 17 g by mouth daily as needed for mild constipation.     . simvastatin (ZOCOR) 10 MG tablet Take 1 tablet (10 mg total) by mouth every evening. 90 tablet 1  . sucralfate (CARAFATE) 1 g tablet Take 1 g by mouth 2 (two) times daily.    . timolol (BETIMOL) 0.5 % ophthalmic solution Place 1 drop into both eyes 2 (two) times daily.     . metoprolol tartrate (LOPRESSOR) 25 MG tablet Take 0.5 tablets (12.5 mg total) by mouth daily. 45 tablet 0   No current facility-administered medications for this visit.   Allergies:  Alphagan [brimonidine], Dorzolamide hcl, Penicillins, and Latex   Social History: The patient  reports that she has never smoked. She has never used smokeless tobacco. She reports that she does not drink alcohol and does not use drugs.   Family History: The patient's family history includes Breast cancer in her maternal aunt, mother, and sister; Cancer in her sister; Cancer (age of onset: 92) in her maternal aunt and mother; Coronary artery disease in an other family member; Heart disease in her father.   ROS:  Please see the history of present illness. Otherwise, complete review of systems is positive for none.  All other systems are reviewed and negative.   Physical Exam: VS:  BP (!) 142/70   Pulse (!) 47   Ht 5\' 5"  (1.651 m)   Wt 211 lb (95.7 kg)   SpO2 99%   BMI 35.11 kg/m , BMI Body mass index is 35.11 kg/m.  Wt Readings from Last 3 Encounters:  11/14/20 211 lb (95.7 kg)  10/06/20 208 lb (94.3 kg)  08/01/20 204 lb (92.5 kg)    General: Patient appears comfortable at rest. Neck: Supple, no elevated JVP or carotid bruits, no thyromegaly. Lungs: Clear to auscultation, nonlabored breathing at rest. Cardiac: Regular rate and rhythm, no S3 or significant systolic murmur, no pericardial rub. Extremities:  No pitting edema, distal pulses  2+. Skin: Warm and dry. Musculoskeletal: No kyphosis. Neuropsychiatric: Alert and oriented x3, affect grossly appropriate.  ECG:  An ECG dated 04/27/2020 was personally reviewed today and demonstrated:  Normal sinus rhythm rate of 60  Recent Labwork: 10/02/2020: BUN 19; Creatinine, Ser 0.77; Hemoglobin 12.2; Platelets 362; Potassium 3.4; Sodium 141     Component Value Date/Time   CHOL  10/25/2007 0058    161        ATP III CLASSIFICATION:  <200     mg/dL   Desirable  200-239  mg/dL   Borderline High  >=240    mg/dL   High   TRIG 53 10/25/2007 0058   HDL 70 10/25/2007 0058   CHOLHDL 2.3 10/25/2007 0058   VLDL 11 10/25/2007 0058   LDLCALC  10/25/2007 0058    80        Total Cholesterol/HDL:CHD Risk Coronary Heart Disease Risk Table                     Men   Women  1/2 Average Risk   3.4   3.3    Other Studies Reviewed Today:  Day 29 of cardiac monitor 09/18/2020     Echocardiogram 05/04/2020 1. Left ventricular ejection fraction, by estimation, is 60 to 65%. The  left ventricle has normal function. The left ventricle has no regional  wall motion abnormalities. Left ventricular diastolic parameters are  indeterminate.  2. Right ventricular systolic function is normal. The right ventricular  size is normal. Tricuspid regurgitation signal is inadequate for assessing  PA pressure.  3. The mitral valve is grossly normal. Trivial mitral valve  regurgitation.  4. The aortic valve is tricuspid. Aortic valve regurgitation is trivial.  5. The inferior vena cava is normal in size with greater than 50%  respiratory variability, suggesting right atrial pressure of 3 mmHg.   MPI Stress test 05/14/2019  No diagnostic ST segment changes to indicate ischemia.  Small, mild intensity, partially reversible apical anteroseptal defect that is most consistent with variable soft tissue attenuation. No large ischemic territories noted.  This is a low risk study.  Nuclear stress EF:  71%.  Echocardiogram 05/14/2019 IMPRESSIONS  1. The left ventricle has normal systolic function, with an ejection  fraction of 55-60%. The cavity size was normal. Left ventricular diastolic  parameters were normal. No evidence of left ventricular regional wall  motion abnormalities.  2. The right ventricle has normal systolic function. The cavity was  normal. There is no increase in right ventricular wall thickness. Right  ventricular systolic pressure could not be assessed.  3. The aortic valve is tricuspid. Mild aortic annular calcification  noted.  4. The mitral valve is grossly normal.  5. The tricuspid valve is grossly normal.  6. The aortic root is normal in size and structure.   Assessment and Plan:  1.  PAF/palpitations Recent visit to the emergency room on October 30 with rapid heart rhythm.  Evidence of atrial fibrillation RVR on EKG rate of 143.  This self resolved and converted back to normal sinus rhythm which was evident on EKG at discharge at 0849 October 02, 2020 showing normal sinus rhythm with a rate of 63.  Stop carvedilol.  At last visit metoprolol was started at 12.5 mg p.o. twice daily along with Eliquis 5 mg p.o. twice daily. A 30-day monitor was placed previously which showed no evidence of atrial fibrillation except for episode which did not register on the preventis monitor as atrial fibrillation on day 29 September 18, 2020.  Episode as noted above under other studies reviewed.  Due to slow heart rate today with a rate of 47 decreasing metoprolol to 12.5 only once a day.  Advised the patient if palpitations increase in frequency to add back the evening dose to 12.5 mg.  Continue Eliquis for now.  2. Essential hypertension BP 142/70.  Continue amlodipine 5 mg.   Change metoprolol to 12.5 mg only once a day due to slow heart rate.  Continue to monitor blood pressure.  3. Mixed hyperlipidemia Continue simvastatin 10 mg daily.  Follows with PCP for lipid  management.  4.  Dyspnea on exertion with leg edema Currently denies any dyspnea or lower extremity edema.  Repeat echocardiogram demonstrated EF of 60 to 65%, no WMA's, trivial MR, trivial AR  5.  Bradycardia Heart rate is 47 today.  At last visit she was placed on metoprolol 12.5 mg p.o. twice daily for episode of atrial fibrillation on recent cardiac monitor.  We are decreasing the Toprol to 12.5 mg once a day.    Medication Adjustments/Labs and Tests Ordered: Current medicines are reviewed at length with the patient today.  Concerns regarding medicines are outlined above.   Disposition: Follow-up with Dr. Domenic Polite or APP 3 months  signed, Levell July, NP 11/14/2020 12:23 PM    [facility] [place], [place], [place] [address] Phone: [phone]; Fax: [phone]

## 2020-11-14 ENCOUNTER — Ambulatory Visit (INDEPENDENT_AMBULATORY_CARE_PROVIDER_SITE_OTHER): Payer: Medicare Other | Admitting: Family Medicine

## 2020-11-14 ENCOUNTER — Encounter: Payer: Self-pay | Admitting: Family Medicine

## 2020-11-14 ENCOUNTER — Other Ambulatory Visit: Payer: Self-pay

## 2020-11-14 VITALS — BP 142/70 | HR 47 | Ht 65.0 in | Wt 211.0 lb

## 2020-11-14 DIAGNOSIS — E782 Mixed hyperlipidemia: Secondary | ICD-10-CM | POA: Diagnosis not present

## 2020-11-14 DIAGNOSIS — I10 Essential (primary) hypertension: Secondary | ICD-10-CM

## 2020-11-14 DIAGNOSIS — I480 Paroxysmal atrial fibrillation: Secondary | ICD-10-CM

## 2020-11-14 DIAGNOSIS — R0600 Dyspnea, unspecified: Secondary | ICD-10-CM

## 2020-11-14 DIAGNOSIS — R0609 Other forms of dyspnea: Secondary | ICD-10-CM

## 2020-11-14 MED ORDER — METOPROLOL TARTRATE 25 MG PO TABS: 12.5000 mg | ORAL_TABLET | Freq: Every day | ORAL | 0 refills | Status: DC

## 2020-11-14 NOTE — Patient Instructions (Addendum)
Your physician recommends that you schedule a follow-up appointment in: [place], NP  Your physician has recommended you make the following change in your medication:   DECREASE LOPRESSOR 12.5 MG DAILY   Thank you for choosing [facility]!!

## 2020-12-05 ENCOUNTER — Other Ambulatory Visit: Payer: Self-pay

## 2020-12-05 MED ORDER — SIMVASTATIN 10 MG PO TABS: 10.0000 mg | ORAL_TABLET | Freq: Every evening | ORAL | 1 refills | Status: DC

## 2020-12-29 ENCOUNTER — Other Ambulatory Visit: Payer: Self-pay | Admitting: Family Medicine

## 2021-01-31 ENCOUNTER — Other Ambulatory Visit: Payer: Self-pay | Admitting: Family Medicine

## 2021-02-13 NOTE — Progress Notes (Signed)
Cardiology Office Note  Date: 02/14/2021   ID: Julie, Jefferson 07-Oct-1946, MRN [id number]  PCP:  Galen Manila, MD  Cardiologist:  No primary care provider on file. Electrophysiologist:  None   Chief Complaint: F/U Takotsubo's Cardiomyopathy, syncope  History of Present Illness: Julie Jefferson is a 75 y.o. female with a history of Takotsubo Cardiomyopathy, syncope (near syncopal episode 2/2 volume depletion 2014). Cardiac cath 2008 minimal CAD, Echo 2014 EF 60-65% no WMA's  Last encounter with Dr. Bronson Ing 09/11/2018 she had no c/o CP, palpitations, SOB, lightheadedness, dizziness, leg swelling, orthopnea, PND, or syncope. BP's were normal at home. Takotsubo's had resolved with EF improved to 55-60% on echo 05/2019.  She is here today for 64-month follow-up.  She denies any recent acute illnesses or hospitalizations.  Blood pressure is elevated today but patient states it is normally in the 130s over 70s at home.  She denies any anginal or exertional symptoms, lightheadedness, dizziness, presyncopal or syncopal episodes.  Denies any significant palpitations or arrhythmias.  States only occasional episodes of skipped heartbeats.  Denies any CVA or TIA-like symptoms, PND, orthopnea, bleeding.  No claudication-like symptoms, DVT or PE-like symptoms, or lower extremity edema.   Past Medical History:  Diagnosis Date  . Aortic atherosclerosis ([place])   . Arthritis    rt knee, back, hands  . Bilateral lower extremity edema   . Breast cancer ([place]) 2005   left  . Chest pain    February, 2014  . Closed right ankle fracture 2016  . Collagen vascular disease ([place])   . Cough    ACE cough 2012  . Ejection fraction    EF 60-65%, echo, February, 2009, (LV function normalized).  . GERD (gastroesophageal reflux disease)   . Glaucoma   . History of anemia    age 20's  . History of breast cancer    left  . HLD (hyperlipidemia)    mixed  . Hypothyroidism   . Pelvic prolapse   .  Personal history of chemotherapy 2005  . Personal history of radiation therapy 2005  . Pre-syncope    Hospital, June, 2014, probably dehydration  . Takotsubo cardiomyopathy 2008   cardiac cath 11/08. minimal coronary disease, anteroseptal akinesis with an EF 55%.  echo 2/09 EF 60-65% (LV function normalized)  . Wears glasses     Past Surgical History:  Procedure Laterality Date  . ANTERIOR AND POSTERIOR REPAIR WITH SACROSPINOUS FIXATION N/A 08/20/2019   Procedure: ANTERIOR AND POSTERIOR REPAIR WITH possible SACROSPINOUS FIXATION;  Surgeon: Everlene Farrier, MD;  Location: [facility];  Service: Gynecology;  Laterality: N/A;  need bed  . BACK SURGERY    . BREAST BIOPSY  01/03/2012  . BREAST LUMPECTOMY Left 2005   left breast  . BUNIONECTOMY     left  . CHOLECYSTECTOMY    . COLONOSCOPY    . GANGLION CYST EXCISION     left  . PARTIAL KNEE ARTHROPLASTY Right 12/28/2016   Procedure: RIGHT UNICOMPARTMENTAL KNEE;  Surgeon: Renette Butters, MD;  Location: [place];  Service: Orthopedics;  Laterality: Right;  . PARTIAL KNEE ARTHROPLASTY Left 09/23/2018   Procedure: UNICOMPARTMENTAL LEFT  KNEE;  Surgeon: Renette Butters, MD;  Location: [facility];  Service: Orthopedics;  Laterality: Left;  . TOTAL ABDOMINAL HYSTERECTOMY  1997  . UPPER GI ENDOSCOPY      Current Outpatient Medications  Medication Sig Dispense Refill  . acetaminophen (TYLENOL) 325 MG tablet Take 2 tablets (  650 mg total) by mouth every 6 (six) hours as needed for mild pain (temperature > 101.5.). 60 tablet 0  . amLODipine (NORVASC) 5 MG tablet Take 5 mg by mouth daily.     . bimatoprost (LUMIGAN) 0.01 % SOLN Place 1 drop into both eyes at bedtime.    Marland Kitchen ELIQUIS 5 MG TABS tablet TAKE 1 TABLET BY MOUTH TWICE A DAY 60 tablet 3  . levothyroxine (SYNTHROID) 112 MCG tablet Take 112 mcg by mouth daily before breakfast.    . meclizine (ANTIVERT) 25 MG tablet Take 25 mg by mouth 3 (three) times daily as  needed for dizziness.    . metoprolol tartrate (LOPRESSOR) 25 MG tablet TAKE 0.5 TABLETS BY MOUTH 2 TIMES DAILY. 90 tablet 1  . Omeprazole (PRILOSEC PO) Take 80 mg by mouth daily.    Marland Kitchen OVER THE COUNTER MEDICATION Take 1 capsule by mouth 2 (two) times daily. [employer]    . polyethylene glycol powder (GLYCOLAX/MIRALAX) powder Take 17 g by mouth daily as needed for mild constipation.     . simvastatin (ZOCOR) 10 MG tablet Take 1 tablet (10 mg total) by mouth every evening. 90 tablet 1  . sucralfate (CARAFATE) 1 g tablet Take 1 g by mouth 2 (two) times daily.    . timolol (BETIMOL) 0.5 % ophthalmic solution Place 1 drop into both eyes 2 (two) times daily.      No current facility-administered medications for this visit.   Allergies:  Alphagan [brimonidine], Dorzolamide hcl, Penicillins, and Latex   Social History: The patient  reports that she has never smoked. She has never used smokeless tobacco. She reports that she does not drink alcohol and does not use drugs.   Family History: The patient's family history includes Breast cancer in her maternal aunt, mother, and sister; Cancer in her sister; Cancer (age of onset: 15) in her maternal aunt and mother; Coronary artery disease in an other family member; Heart disease in her father.   ROS:  Please see the history of present illness. Otherwise, complete review of systems is positive for none.  All other systems are reviewed and negative.   Physical Exam: VS:  BP (!) 150/78   Pulse (!) 58   Ht 5\' 5"  (1.651 m)   Wt 209 lb (94.8 kg)   SpO2 91%   BMI 34.78 kg/m , BMI Body mass index is 34.78 kg/m.  Wt Readings from Last 3 Encounters:  02/14/21 209 lb (94.8 kg)  11/14/20 211 lb (95.7 kg)  10/06/20 208 lb (94.3 kg)    General: Patient appears comfortable at rest. Neck: Supple, no elevated JVP or carotid bruits, no thyromegaly. Lungs: Clear to auscultation, nonlabored breathing at rest. Cardiac: Regular rate and rhythm, no S3 or  significant systolic murmur, no pericardial rub. Extremities:  No pitting edema, distal pulses 2+. Skin: Warm and dry. Musculoskeletal: No kyphosis. Neuropsychiatric: Alert and oriented x3, affect grossly appropriate.  ECG:  An ECG dated 04/27/2020 was personally reviewed today and demonstrated:  Normal sinus rhythm rate of 60  Recent Labwork: 10/02/2020: BUN 19; Creatinine, Ser 0.77; Hemoglobin 12.2; Platelets 362; Potassium 3.4; Sodium 141     Component Value Date/Time   CHOL  10/25/2007 0058    161        ATP III CLASSIFICATION:  <200     mg/dL   Desirable  200-239  mg/dL   Borderline High  >=240    mg/dL   High   TRIG 53 10/25/2007  0058   HDL 70 10/25/2007 0058   CHOLHDL 2.3 10/25/2007 0058   VLDL 11 10/25/2007 0058   LDLCALC  10/25/2007 0058    80        Total Cholesterol/HDL:CHD Risk Coronary Heart Disease Risk Table                     Men   Women  1/2 Average Risk   3.4   3.3    Other Studies Reviewed Today:  Day 29 of cardiac monitor 09/18/2020     Echocardiogram 05/04/2020 1. Left ventricular ejection fraction, by estimation, is 60 to 65%. The  left ventricle has normal function. The left ventricle has no regional  wall motion abnormalities. Left ventricular diastolic parameters are  indeterminate.  2. Right ventricular systolic function is normal. The right ventricular  size is normal. Tricuspid regurgitation signal is inadequate for assessing  PA pressure.  3. The mitral valve is grossly normal. Trivial mitral valve  regurgitation.  4. The aortic valve is tricuspid. Aortic valve regurgitation is trivial.  5. The inferior vena cava is normal in size with greater than 50%  respiratory variability, suggesting right atrial pressure of 3 mmHg.   MPI Stress test 05/14/2019  No diagnostic ST segment changes to indicate ischemia.  Small, mild intensity, partially reversible apical anteroseptal defect that is most consistent with variable soft tissue  attenuation. No large ischemic territories noted.  This is a low risk study.  Nuclear stress EF: 71%.  Echocardiogram 05/14/2019 IMPRESSIONS  1. The left ventricle has normal systolic function, with an ejection  fraction of 55-60%. The cavity size was normal. Left ventricular diastolic  parameters were normal. No evidence of left ventricular regional wall  motion abnormalities.  2. The right ventricle has normal systolic function. The cavity was  normal. There is no increase in right ventricular wall thickness. Right  ventricular systolic pressure could not be assessed.  3. The aortic valve is tricuspid. Mild aortic annular calcification  noted.  4. The mitral valve is grossly normal.  5. The tricuspid valve is grossly normal.  6. The aortic root is normal in size and structure.   Assessment and Plan:  1.  PAF/palpitations Heart rate today is 58.  Metoprolol previously decreased to 12.5 only once a day due to bradycardia.  Advised the patient if palpitations increase in frequency to add back the evening dose to 12.5 mg.  Continue Eliquis for now.  2. Essential hypertension Blood pressure 150/78 today.  Patient states at home her blood pressures are usually 130s over 70s.  Continue amlodipine 5 mg.  Continue to monitor blood pressure.  3. Mixed hyperlipidemia Continue simvastatin 10 mg daily.  Follows with PCP for lipid management.  4.  Dyspnea on exertion with leg edema Currently denies any dyspnea or lower extremity edema.  Repeat echocardiogram demonstrated EF of 60 to 65%, no WMA's, trivial MR, trivial AR  5.  Bradycardia Heart rate today is 58.  Continue metoprolol to 12.5 mg d/t bradycardia once a day with the understanding she could increase back to bid if needed for palpitations.  Medication Adjustments/Labs and Tests Ordered: Current medicines are reviewed at length with the patient today.  Concerns regarding medicines are outlined above.   Disposition: Follow-up  with Dr. Harl Bowie or APP 6 months signed, Levell July, NP 02/14/2021 10:37 AM    [facility] at [place], [place], [place] [address] Phone: [phone]; Fax: [phone]

## 2021-02-14 ENCOUNTER — Ambulatory Visit (INDEPENDENT_AMBULATORY_CARE_PROVIDER_SITE_OTHER): Payer: Medicare Other | Admitting: Family Medicine

## 2021-02-14 ENCOUNTER — Encounter: Payer: Self-pay | Admitting: Family Medicine

## 2021-02-14 VITALS — BP 150/78 | HR 58 | Ht 65.0 in | Wt 209.0 lb

## 2021-02-14 DIAGNOSIS — R001 Bradycardia, unspecified: Secondary | ICD-10-CM

## 2021-02-14 DIAGNOSIS — I10 Essential (primary) hypertension: Secondary | ICD-10-CM | POA: Diagnosis not present

## 2021-02-14 DIAGNOSIS — R0609 Other forms of dyspnea: Secondary | ICD-10-CM

## 2021-02-14 DIAGNOSIS — R0600 Dyspnea, unspecified: Secondary | ICD-10-CM

## 2021-02-14 DIAGNOSIS — I480 Paroxysmal atrial fibrillation: Secondary | ICD-10-CM

## 2021-02-14 DIAGNOSIS — E782 Mixed hyperlipidemia: Secondary | ICD-10-CM | POA: Diagnosis not present

## 2021-02-14 NOTE — Patient Instructions (Signed)
Your physician recommends that you schedule a follow-up appointment in: 6 MONTHS WITH MD  Your physician recommends that you continue on your current medications as directed. Please refer to the Current Medication list given to you today.  Thank you for choosing [facility]!!      

## 2021-03-28 ENCOUNTER — Other Ambulatory Visit: Payer: Self-pay | Admitting: *Deleted

## 2021-03-28 MED ORDER — METOPROLOL TARTRATE 25 MG PO TABS: 25.0000 mg | ORAL_TABLET | Freq: Every morning | ORAL | 6 refills | Status: DC

## 2021-05-12 ENCOUNTER — Other Ambulatory Visit: Payer: Self-pay | Admitting: Family Medicine

## 2021-05-15 NOTE — Telephone Encounter (Signed)
Prescription refill request for Eliquis received. Indication: PAF Last office visit: 02/14/21  A. Quinn Scr: 0.77 on 10/02/20 Age: 75 Weight: 94.8kg  Based on above findings Eliquis 5mg  twice daily is the appropriate dose.  Refill approved.

## 2021-05-31 ENCOUNTER — Telehealth: Payer: Self-pay | Admitting: Family Medicine

## 2021-05-31 MED ORDER — METOPROLOL TARTRATE 25 MG PO TABS: 25.0000 mg | ORAL_TABLET | Freq: Two times a day (BID) | ORAL | 3 refills | Status: DC

## 2021-05-31 NOTE — Telephone Encounter (Signed)
Spoke to pt who stated that she's been having to take a whole 25 mg tablet in the am and a whole 25 mg tablet in the pm. Pt states that she has better rate control this way and would like a prescription to reflect this. Please advise.

## 2021-05-31 NOTE — Telephone Encounter (Signed)
Medication changes made. Medication list updated to reflect changes

## 2021-05-31 NOTE — Telephone Encounter (Signed)
Pt called stating that she's supposed to be taking 50 mg daily of the metoprolol tartrate (LOPRESSOR) 25 MG tablet [[id number]]   stated that Jonni Sanger increased it at her last visit and said there wasn't a new Rx sent in.   She's leaving for a cruise today and is going to run out while on the cruise.   [facility] [place]

## 2021-06-19 ENCOUNTER — Telehealth: Payer: Self-pay | Admitting: Cardiology

## 2021-06-19 DIAGNOSIS — I480 Paroxysmal atrial fibrillation: Secondary | ICD-10-CM

## 2021-06-19 NOTE — Telephone Encounter (Signed)
Appointment scheduled in [facility]ib Clinic with Malka So on Wednesday 06/21/21 @3 :00 pm located inside of [place]. Patient aware and advised if symptoms return or get worse, to go to the ED for an evaluation. Verbalized understanding of plan.

## 2021-06-19 NOTE — Telephone Encounter (Signed)
Reports around 1:00 am Saturday morning she was in a-fib, felt like heart was quivering, along with chest pain rated 6/10. Reports taking aspirin 650 mg and around 7:00 am, heart went back into rhythm. Says chest pain did not last long. Reports having a little SOB and dizziness during episode of a-fib and chest pain. Reports HR was 112 and then dropped to 60.  Currently in [place] for eye appointment and says she feels better today Offered 3:30 pm appointment today in A-Fib but declined due to not wanting to wait around and needed to get back to [place] Denies active chest pain, sob or dizziness Advised that appointment in [facility] would be arranged on Wednesday this week if available and she would be contacted with appointment information Advised if symptoms returned or got worse, to go to the ED for an evaluation Verbalized understanding of plan

## 2021-06-19 NOTE — Telephone Encounter (Signed)
New message    Patient is having a bad episode of AFIB , she is having chest pain with it and she took some aspirin and it subsided. This has been going on since Friday

## 2021-06-19 NOTE — Telephone Encounter (Signed)
Agree with a fib clinic eval   Zandra Abts MD

## 2021-06-21 ENCOUNTER — Ambulatory Visit (HOSPITAL_COMMUNITY)
Admission: RE | Admit: 2021-06-21 | Discharge: 2021-06-21 | Disposition: A | Discharge status: Home / Self Care | Payer: Medicare Other | Source: Ambulatory Visit | Attending: Physician Assistant | Admitting: Physician Assistant

## 2021-06-21 ENCOUNTER — Encounter (HOSPITAL_COMMUNITY): Payer: Self-pay | Admitting: Physician Assistant

## 2021-06-21 ENCOUNTER — Other Ambulatory Visit: Payer: Self-pay

## 2021-06-21 VITALS — BP 128/72 | HR 62 | Ht 65.0 in | Wt 216.0 lb

## 2021-06-21 DIAGNOSIS — Z79899 Other long term (current) drug therapy: Secondary | ICD-10-CM | POA: Diagnosis not present

## 2021-06-21 DIAGNOSIS — Z8249 Family history of ischemic heart disease and other diseases of the circulatory system: Secondary | ICD-10-CM | POA: Insufficient documentation

## 2021-06-21 DIAGNOSIS — Z7901 Long term (current) use of anticoagulants: Secondary | ICD-10-CM | POA: Insufficient documentation

## 2021-06-21 DIAGNOSIS — Z90710 Acquired absence of both cervix and uterus: Secondary | ICD-10-CM | POA: Diagnosis not present

## 2021-06-21 DIAGNOSIS — I480 Paroxysmal atrial fibrillation: Secondary | ICD-10-CM | POA: Diagnosis present

## 2021-06-21 DIAGNOSIS — R0683 Snoring: Secondary | ICD-10-CM | POA: Diagnosis not present

## 2021-06-21 DIAGNOSIS — E669 Obesity, unspecified: Secondary | ICD-10-CM | POA: Diagnosis not present

## 2021-06-21 DIAGNOSIS — I700 Atherosclerosis of aorta: Secondary | ICD-10-CM | POA: Insufficient documentation

## 2021-06-21 DIAGNOSIS — I10 Essential (primary) hypertension: Secondary | ICD-10-CM | POA: Diagnosis not present

## 2021-06-21 DIAGNOSIS — D6869 Other thrombophilia: Secondary | ICD-10-CM | POA: Diagnosis not present

## 2021-06-21 DIAGNOSIS — E785 Hyperlipidemia, unspecified: Secondary | ICD-10-CM | POA: Diagnosis not present

## 2021-06-21 DIAGNOSIS — Z6835 Body mass index (BMI) 35.0-35.9, adult: Secondary | ICD-10-CM | POA: Diagnosis not present

## 2021-06-21 NOTE — Progress Notes (Signed)
Primary Care Physician: Galen Manila, MD Primary Cardiologist: Dr Domenic Polite (new) Primary Electrophysiologist: none Referring Physician: Dr Rolly Pancake is a 75 y.o. female with a history of HTN, HLD, aortic atherosclerosis, stress CM 2008, atrial fibrillation who presents for consultation in the [facility].  The patient was initially diagnosed with atrial fibrillation 09/2020 after presenting to the ED with symptoms of palpitations and chest heaviness. She spontaneously converted before leaving the ED. Patient is on Eliquis for a CHADS2VASC score of 5. Since that time, she has had increasing frequency of episodes with palpitations and exercise intolerance. She has taken extra PRN doses of BB which help to convert her to SR but also make her presyncopal and extremely fatigued. She denies alcohol use but does admit to snoring and witnessed apnea.   Today, she denies symptoms of chest pain, shortness of breath, orthopnea, PND, dizziness, syncope, bleeding, or neurologic sequela. The patient is tolerating medications without difficulties and is otherwise without complaint today.    Atrial Fibrillation Risk Factors:  she does have symptoms or diagnosis of sleep apnea. she does not have a history of rheumatic fever. she does not have a history of alcohol use. The patient does not have a history of early familial atrial fibrillation or other arrhythmias.  she has a BMI of Body mass index is 35.94 kg/m.Marland Kitchen Filed Weights   06/21/21 1519  Weight: 98 kg    Family History  Problem Relation Age of Onset   Cancer Mother 70       breast   Breast cancer Mother    Heart disease Father    Coronary artery disease Other        family hx   Cancer Sister        LCIS 63   Breast cancer Sister    Cancer Maternal Aunt 75       breast   Breast cancer Maternal Aunt      Atrial Fibrillation Management history:  Previous antiarrhythmic drugs:  none Previous cardioversions: none Previous ablations: none CHADS2VASC score: 5 Anticoagulation history: Eliquis   Past Medical History:  Diagnosis Date   Aortic atherosclerosis ([facility])    Arthritis    rt knee, back, hands   Bilateral lower extremity edema    Breast cancer ([place]) 2005   left   Chest pain    February, 2014   Closed right ankle fracture 2016   Collagen vascular disease ([place])    Cough    ACE cough 2012   Ejection fraction    EF 60-65%, echo, February, 2009, (LV function normalized).   GERD (gastroesophageal reflux disease)    Glaucoma    History of anemia    age 28's   History of breast cancer    left   HLD (hyperlipidemia)    mixed   Hypothyroidism    Pelvic prolapse    Personal history of chemotherapy 2005   Personal history of radiation therapy 2005   Pre-syncope    Hospital, June, 2014, probably dehydration   Takotsubo cardiomyopathy 2008   cardiac cath 11/08. minimal coronary disease, anteroseptal akinesis with an EF 55%.  echo 2/09 EF 60-65% (LV function normalized)   Wears glasses    Past Surgical History:  Procedure Laterality Date   ANTERIOR AND POSTERIOR REPAIR WITH SACROSPINOUS FIXATION N/A 08/20/2019   Procedure: ANTERIOR AND POSTERIOR REPAIR WITH possible SACROSPINOUS FIXATION;  Surgeon: Everlene Farrier, MD;  Location: [facility];  Service: Gynecology;  Laterality: N/A;  need bed   BACK SURGERY     BREAST BIOPSY  01/03/2012   BREAST LUMPECTOMY Left 2005   left breast   BUNIONECTOMY     left   CHOLECYSTECTOMY     COLONOSCOPY     GANGLION CYST EXCISION     left   PARTIAL KNEE ARTHROPLASTY Right 12/28/2016   Procedure: RIGHT UNICOMPARTMENTAL KNEE;  Surgeon: Renette Butters, MD;  Location: [facility];  Service: Orthopedics;  Laterality: Right;   PARTIAL KNEE ARTHROPLASTY Left 09/23/2018   Procedure: UNICOMPARTMENTAL LEFT  KNEE;  Surgeon: Renette Butters, MD;  Location: [facility];  Service: Orthopedics;   Laterality: Left;   TOTAL ABDOMINAL HYSTERECTOMY  1997   UPPER GI ENDOSCOPY      Current Outpatient Medications  Medication Sig Dispense Refill   amLODipine (NORVASC) 5 MG tablet Take 5 mg by mouth daily.      apixaban (ELIQUIS) 5 MG TABS tablet TAKE 1 TABLET BY MOUTH TWICE A DAY 60 tablet 6   bimatoprost (LUMIGAN) 0.01 % SOLN Place 1 drop into both eyes at bedtime.     levothyroxine (SYNTHROID) 112 MCG tablet Take 112 mcg by mouth daily before breakfast.     meclizine (ANTIVERT) 25 MG tablet Take 25 mg by mouth 3 (three) times daily as needed for dizziness.     metoprolol tartrate (LOPRESSOR) 25 MG tablet Take 1 tablet (25 mg total) by mouth 2 (two) times daily. 180 tablet 3   Omeprazole (PRILOSEC PO) Take 40 mg by mouth 2 (two) times daily.     polyethylene glycol powder (GLYCOLAX/MIRALAX) powder Take 17 g by mouth daily as needed for mild constipation.      simvastatin (ZOCOR) 10 MG tablet TAKE 1 TABLET BY MOUTH EVERY DAY IN THE EVENING 90 tablet 1   sucralfate (CARAFATE) 1 g tablet Take 1 g by mouth 2 (two) times daily.     timolol (BETIMOL) 0.5 % ophthalmic solution Place 1 drop into both eyes 2 (two) times daily.      acetaminophen (TYLENOL) 325 MG tablet Take 2 tablets (650 mg total) by mouth every 6 (six) hours as needed for mild pain (temperature > 101.5.). 60 tablet 0   OVER THE COUNTER MEDICATION Take 1 capsule by mouth 2 (two) times daily. [employer] Supplement     No current facility-administered medications for this encounter.    Allergies  Allergen Reactions   Alphagan [Brimonidine] Other (See Comments)    Dries eyes out.    Dorzolamide Hcl Other (See Comments)    Dried eyes out.   Penicillins Other (See Comments)    Has patient had a PCN reaction causing immediate rash, facial/tongue/throat swelling, SOB or lightheadedness with hypotension: Unknown childhood reaction Has patient had a PCN reaction causing severe rash involving mucus membranes or skin necrosis: No Has  patient had a PCN reaction that required hospitalization No Has patient had a PCN reaction occurring within the last 10 years: No If all of the above answers are "NO", then may proceed with Cephalosporin use.    Latex Rash    Social History   Socioeconomic History   Marital status: Widowed    Spouse name: Not on file   Number of children: Not on file   Years of education: Not on file   Highest education level: Not on file  Occupational History   Not on file  Tobacco Use   Smoking status: Never   Smokeless  tobacco: Never   Tobacco comments:    tobacco use - no   Vaping Use   Vaping Use: Never used  Substance and Sexual Activity   Alcohol use: No    Alcohol/week: 0.0 standard drinks   Drug use: No   Sexual activity: Not on file  Other Topics Concern   Not on file  Social History Narrative   Widowed, works full time, gets regular exercise.    Social Determinants of Health   Financial Resource Strain: Not on file  Food Insecurity: Not on file  Transportation Needs: Not on file  Physical Activity: Not on file  Stress: Not on file  Social Connections: Not on file  Intimate Partner Violence: Not on file     ROS- All systems are reviewed and negative except as per the HPI above.  Physical Exam: Vitals:   06/21/21 1519  BP: 128/72  Pulse: 62  Weight: 98 kg  Height: 5\' 5"  (1.651 m)    GEN- The patient is a well appearing obese female, alert and oriented x 3 today.   Head- normocephalic, atraumatic Eyes-  Sclera clear, conjunctiva pink Ears- hearing intact Oropharynx- clear Neck- supple  Lungs- Clear to ausculation bilaterally, normal work of breathing Heart- Regular rate and rhythm, no murmurs, rubs or gallops  GI- soft, NT, ND, + BS Extremities- no clubbing, cyanosis. Non pitting edema bilaterally MS- no significant deformity or atrophy Skin- no rash or lesion Psych- euthymic mood, full affect Neuro- strength and sensation are intact  Wt Readings from  Last 3 Encounters:  06/21/21 98 kg  02/14/21 94.8 kg  11/14/20 95.7 kg    EKG today demonstrates  SR Vent. rate 62 BPM PR interval 126 ms QRS duration 98 ms QT/QTcB 440/446 ms  Echo 05/04/20 demonstrated   1. Left ventricular ejection fraction, by estimation, is 60 to 65%. The  left ventricle has normal function. The left ventricle has no regional  wall motion abnormalities. Left ventricular diastolic parameters are  indeterminate.   2. Right ventricular systolic function is normal. The right ventricular  size is normal. Tricuspid regurgitation signal is inadequate for assessing  PA pressure.   3. The mitral valve is grossly normal. Trivial mitral valve  regurgitation.   4. The aortic valve is tricuspid. Aortic valve regurgitation is trivial.   5. The inferior vena cava is normal in size with greater than 50%  respiratory variability, suggesting right atrial pressure of 3 mmHg.   Epic records are reviewed at length today  CHA2DS2-VASc Score = 5  The patient's score is based upon: CHF History: No HTN History: Yes Diabetes History: No Stroke History: No Vascular Disease History: Yes (aortic atherosclerosis) Age Score: 2 Gender Score: 1     ASSESSMENT AND PLAN: 1. Paroxysmal Atrial Fibrillation (ICD10:  I48.0) The patient's CHA2DS2-VASc score is 5, indicating a 7.2% annual risk of stroke.   Patient having more frequent episodes of symptomatic afib. We discussed therapeutic options including AAD vs ablation. She would like to avoid medication if possible. She is agreeable to referral for ablation.   Continue Eliquis 5 mg BID Continue Lopressor 25 mg BID, may take an extra 12.5 mg for heart racing.  2. Secondary Hypercoagulable State (ICD10:  D68.69) The patient is at significant risk for stroke/thromboembolism based upon her CHA2DS2-VASc Score of 5.  Continue Apixaban (Eliquis).   3. Obesity Body mass index is 35.94 kg/m. Lifestyle modification was discussed at length  including regular exercise and weight reduction.  4. Snoring/witness  apnea  The importance of adequate treatment of sleep apnea was discussed today in order to improve our ability to maintain sinus rhythm long term. Will refer for sleep study.   5. HTN Stable, no changes today.   Follow up with EP for ablation consideration. Follow up with Dr Domenic Polite as scheduled.    [facility] [address] [place], [place] [address] [phone] 06/21/2021 3:27 PM

## 2021-07-17 ENCOUNTER — Encounter: Payer: Medicare Other | Admitting: Cardiology

## 2021-07-18 ENCOUNTER — Telehealth: Payer: Self-pay | Admitting: *Deleted

## 2021-07-18 NOTE — Telephone Encounter (Signed)
Patient is scheduled for lab study on 07/17/21. Patient understands her sleep study will be done at [facility] sleep lab. Patient understands she will receive a sleep packet in a week or so. Patient understands to call if he does not receive the sleep packet in a timely manner.  Patient cancelled her appointment.

## 2021-07-21 ENCOUNTER — Other Ambulatory Visit: Payer: Self-pay

## 2021-07-21 ENCOUNTER — Ambulatory Visit: Payer: Medicare Other | Attending: Physician Assistant | Admitting: Cardiology

## 2021-07-21 DIAGNOSIS — G4733 Obstructive sleep apnea (adult) (pediatric): Secondary | ICD-10-CM | POA: Diagnosis not present

## 2021-07-21 DIAGNOSIS — I480 Paroxysmal atrial fibrillation: Secondary | ICD-10-CM | POA: Diagnosis present

## 2021-07-23 NOTE — Procedures (Signed)
   Patient Name: Julie Jefferson, Maggiacomo Date: 07/21/2021 Gender: Female D.O.B: 03/09/1946 Age (years): 2 Referring Provider: Malka So PA Height (inches): 65 Interpreting Physician: Fransico Him MD, ABSM Weight (lbs): 209 RPSGT: Peak, Robert BMI: 35 MRN: [id number] Neck Size: 14.00  CLINICAL INFORMATION Sleep Study Type: NPSG  Indication for sleep study: N/A  Epworth Sleepiness Score: 13  SLEEP STUDY TECHNIQUE As per the AASM Manual for the Scoring of Sleep and Associated Events v2.3 (April 2016) with a hypopnea requiring 4% desaturations.  The channels recorded and monitored were frontal, central and occipital EEG, electrooculogram (EOG), submentalis EMG (chin), nasal and oral airflow, thoracic and abdominal wall motion, anterior tibialis EMG, snore microphone, electrocardiogram, and pulse oximetry.  MEDICATIONS Medications self-administered by patient taken the night of the study : N/A  SLEEP ARCHITECTURE The study was initiated at 9:35:14 PM and ended at 4:37:37 AM.  Sleep onset time was 2.1 minutes and the sleep efficiency was 76.5%. The total sleep time was 323.3 minutes.  Stage REM latency was 123.5 minutes.  The patient spent 6.80% of the night in stage N1 sleep, 67.99% in stage N2 sleep, 8.35% in stage N3 and 16.9% in REM.  Alpha intrusion was absent.  Supine sleep was 15.12%.  RESPIRATORY PARAMETERS The overall apnea/hypopnea index (AHI) was 7.8 per hour. There were 4 total apneas, including 3 obstructive, 1 central and 0 mixed apneas. There were 38 hypopneas and 2 RERAs.  The AHI during Stage REM sleep was 22.0 per hour.  AHI while supine was 38.1 per hour.  The mean oxygen saturation was 94.55%. The minimum SpO2 during sleep was 82.00%.  soft snoring was noted during this study.  CARDIAC DATA The 2 lead EKG demonstrated sinus rhythm. The mean heart rate was 57.97 beats per minute. Other EKG findings include: PVCs.  LEG MOVEMENT DATA The total  PLMS were 0 with a resulting PLMS index of 0.00. Associated arousal with leg movement index was 0.0 .  IMPRESSIONS - Mild obstructive sleep apnea occurred during this study (AHI = 7.8/h) overall but moderate dyring REM sleep. - No significant central sleep apnea occurred during this study (CAI = 0.2/h). - Mild oxygen desaturation was noted during this study (Min O2 = 82.00%). - The patient snored with soft snoring volume. - EKG findings include PVCs. - Clinically significant periodic limb movements did not occur during sleep. No significant associated arousals.  DIAGNOSIS - Obstructive Sleep Apnea (G47.33)  RECOMMENDATIONS - Recommend in lab CPAP titration.  - Positional therapy avoiding supine position during sleep. - Avoid alcohol, sedatives and other CNS depressants that may worsen sleep apnea and disrupt normal sleep architecture. - Sleep hygiene should be reviewed to assess factors that may improve sleep quality. - Weight management and regular exercise should be initiated or continued if appropriate.  [Electronically signed] 07/23/2021 09:40 PM  Fransico Him MD, ABSM Diplomate, American Board of Sleep Medicine

## 2021-07-24 ENCOUNTER — Ambulatory Visit (INDEPENDENT_AMBULATORY_CARE_PROVIDER_SITE_OTHER): Payer: Medicare Other | Admitting: Internal Medicine

## 2021-07-24 ENCOUNTER — Other Ambulatory Visit: Payer: Self-pay

## 2021-07-24 ENCOUNTER — Encounter: Payer: Self-pay | Admitting: *Deleted

## 2021-07-24 VITALS — BP 128/72 | HR 57 | Ht 65.0 in | Wt 207.0 lb

## 2021-07-24 DIAGNOSIS — I10 Essential (primary) hypertension: Secondary | ICD-10-CM

## 2021-07-24 DIAGNOSIS — I480 Paroxysmal atrial fibrillation: Secondary | ICD-10-CM | POA: Diagnosis not present

## 2021-07-24 DIAGNOSIS — D6869 Other thrombophilia: Secondary | ICD-10-CM

## 2021-07-24 NOTE — Addendum Note (Signed)
Addended by: Darrell Jewel on: 07/24/2021 01:36 PM   Modules accepted: Orders

## 2021-07-24 NOTE — Progress Notes (Signed)
Electrophysiology Office Note   Date:  07/24/2021   ID:  Aluel, Buist 1946/03/18, MRN [id number]  PCP:  Galen Manila, MD  Cardiologist:  Dr Domenic Polite Primary Electrophysiologist: Thompson Grayer, MD    CC: afib   History of Present Illness: Julie Jefferson is a 75 y.o. female who presents today for electrophysiology evaluation.   He is referred by Dr Domenic Polite and Adline Peals Julie Jefferson was diagnosed with afib 10/21 after presenting with tachypalpitations.  Julie Jefferson was placed on eliquis.   Julie Jefferson has done reasonably well. Julie Jefferson continues to have episodes of afib.  + SOB and fatigue with episodes. + snores Today, Julie Jefferson denies symptoms of palpitations, chest pain,  orthopnea, PND, lower extremity edema, claudication, dizziness, presyncope, syncope, bleeding, or neurologic sequela. The patient is tolerating medications without difficulties and is otherwise without complaint today.    Past Medical History:  Diagnosis Date   Aortic atherosclerosis (HCC)    Arthritis    rt knee, back, hands   Bilateral lower extremity edema    Breast cancer ([place]) 2005   left   Chest pain    February, 2014   Closed right ankle fracture 2016   Collagen vascular disease ([place])    Cough    ACE cough 2012   Ejection fraction    EF 60-65%, echo, February, 2009, (LV function normalized).   GERD (gastroesophageal reflux disease)    Glaucoma    History of anemia    age 40's   History of breast cancer    left   HLD (hyperlipidemia)    mixed   Hypothyroidism    Pelvic prolapse    Personal history of chemotherapy 2005   Personal history of radiation therapy 2005   Pre-syncope    Hospital, June, 2014, probably dehydration   Takotsubo cardiomyopathy 2008   cardiac cath 11/08. minimal coronary disease, anteroseptal akinesis with an EF 55%.  echo 2/09 EF 60-65% (LV function normalized)   Wears glasses    Past Surgical History:  Procedure Laterality Date   ANTERIOR AND POSTERIOR REPAIR WITH SACROSPINOUS  FIXATION N/A 08/20/2019   Procedure: ANTERIOR AND POSTERIOR REPAIR WITH possible SACROSPINOUS FIXATION;  Jefferson: Everlene Farrier, MD;  Location: [facility];  Service: Gynecology;  Laterality: N/A;  need bed   BACK SURGERY     BREAST BIOPSY  01/03/2012   BREAST LUMPECTOMY Left 2005   left breast   BUNIONECTOMY     left   CHOLECYSTECTOMY     COLONOSCOPY     GANGLION CYST EXCISION     left   PARTIAL KNEE ARTHROPLASTY Right 12/28/2016   Procedure: RIGHT UNICOMPARTMENTAL KNEE;  Jefferson: Renette Butters, MD;  Location: [facility];  Service: Orthopedics;  Laterality: Right;   PARTIAL KNEE ARTHROPLASTY Left 09/23/2018   Procedure: UNICOMPARTMENTAL LEFT  KNEE;  Jefferson: Renette Butters, MD;  Location: [facility];  Service: Orthopedics;  Laterality: Left;   TOTAL ABDOMINAL HYSTERECTOMY  1997   UPPER GI ENDOSCOPY       Current Outpatient Medications  Medication Sig Dispense Refill   cycloSPORINE (RESTASIS) 0.05 % ophthalmic emulsion Apply 1 drop to eye daily.     acetaminophen (TYLENOL) 325 MG tablet Take 2 tablets (650 mg total) by mouth every 6 (six) hours as needed for mild pain (temperature > 101.5.). 60 tablet 0   amLODipine (NORVASC) 5 MG tablet Take 5 mg by mouth daily.      apixaban (ELIQUIS) 5 MG TABS tablet  TAKE 1 TABLET BY MOUTH TWICE A DAY 60 tablet 6   bimatoprost (LUMIGAN) 0.01 % SOLN Place 1 drop into both eyes at bedtime.     levothyroxine (SYNTHROID) 112 MCG tablet Take 112 mcg by mouth daily before breakfast.     meclizine (ANTIVERT) 25 MG tablet Take 25 mg by mouth 3 (three) times daily as needed for dizziness.     metoprolol tartrate (LOPRESSOR) 25 MG tablet Take 1 tablet (25 mg total) by mouth 2 (two) times daily. 180 tablet 3   Omeprazole (PRILOSEC PO) Take 40 mg by mouth 2 (two) times daily.     OVER THE COUNTER MEDICATION Take 1 capsule by mouth 2 (two) times daily. Hydro Eye Supplement     polyethylene glycol powder (GLYCOLAX/MIRALAX) powder  Take 17 g by mouth daily as needed for mild constipation.      simvastatin (ZOCOR) 10 MG tablet TAKE 1 TABLET BY MOUTH EVERY DAY IN THE EVENING 90 tablet 1   sucralfate (CARAFATE) 1 g tablet Take 1 g by mouth 2 (two) times daily.     timolol (BETIMOL) 0.5 % ophthalmic solution Place 1 drop into both eyes 2 (two) times daily.      No current facility-administered medications for this visit.    Allergies:   Alphagan [brimonidine], Dorzolamide hcl, Penicillins, and Latex   Social History:  The patient  reports that Julie Jefferson has never smoked. Julie Jefferson has never used smokeless tobacco. Julie Jefferson reports that Julie Jefferson does not drink alcohol and does not use drugs.   Family History:  The patient's family history includes Breast cancer in her maternal aunt, mother, and sister; Cancer in her sister; Cancer (age of onset: 57) in her maternal aunt and mother; Coronary artery disease in an other family member; Heart disease in her father.    ROS:  Please see the history of present illness.   All other systems are personally reviewed and negative.    PHYSICAL EXAM: VS:  There were no vitals taken for this visit. , BMI There is no height or weight on file to calculate BMI. GEN: Well nourished, well developed, in no acute distress HEENT: normal Neck: no JVD, carotid bruits, or masses Cardiac: RRR; no murmurs, rubs, or gallops,no edema  Respiratory:  clear to auscultation bilaterally, normal work of breathing GI: soft, nontender, nondistended, + BS MS: no deformity or atrophy Skin: warm and dry  Neuro:  Strength and sensation are intact Psych: euthymic mood, full affect  EKG:  EKG is ordered today. The ekg ordered today is personally reviewed and shows sinus rhythm 57 bpm, PR 146 msec, QRS 80 msec, Qtc 420 msec   Recent Labs: 10/02/2020: BUN 19; Creatinine, Ser 0.77; Hemoglobin 12.2; Platelets 362; Potassium 3.4; Sodium 141  personally reviewed   Lipid Panel     Component Value Date/Time   CHOL  10/25/2007 0058     161        ATP III CLASSIFICATION:  <200     mg/dL   Desirable  200-239  mg/dL   Borderline High  >=240    mg/dL   High   TRIG 53 10/25/2007 0058   HDL 70 10/25/2007 0058   CHOLHDL 2.3 10/25/2007 0058   VLDL 11 10/25/2007 0058   LDLCALC  10/25/2007 0058    80        Total Cholesterol/HDL:CHD Risk Coronary Heart Disease Risk Table  Men   Women  1/2 Average Risk   3.4   3.3   personally reviewed   Wt Readings from Last 3 Encounters:  06/21/21 216 lb (98 kg)  02/14/21 209 lb (94.8 kg)  11/14/20 211 lb (95.7 kg)      Other studies personally reviewed: Additional studies/ records that were reviewed today include: AF clinic notes, prior echo  Review of the above records today demonstrates: as above  Echo 05/04/20- EF 60%,   ASSESSMENT AND PLAN:  1.  Paroxysmal atrial fibrillation The patient has symptomatic, recurrent  atrial fibrillation.   Chads2vasc score is 5.  Julie Jefferson is anticoagulated with eliquis . Therapeutic strategies for afib including medicine (tikosyn, flecainide, amiodarone) and ablation were discussed in detail with the patient today. Risk, benefits, and alternatives to EP study and radiofrequency ablation for afib were also discussed in detail today. These risks include but are not limited to stroke, bleeding, vascular damage, tamponade, perforation, damage to the esophagus, lungs, and other structures, pulmonary vein stenosis, worsening renal function, and death. The patient understands these risk and wishes to proceed.  We will therefore proceed with catheter ablation at the next available time.  Carto, ICE, anesthesia are requested for the procedure.  Will also obtain cardiac CT prior to the procedure to exclude LAA thrombus and further evaluate atrial anatomy.  2. Obesity Body mass index is 34.45 kg/m. Lifestyle modification is advised  3. Snoring Sleep study per Dr Radford Pax  4. HTN Stable No change required today   Risks, benefits and  potential toxicities for medications prescribed and/or refilled reviewed with patient today.      Army Fossa, MD  07/24/2021 12:42 PM     [place] [address] [address] [phone] (office) [phone] (fax)

## 2021-07-24 NOTE — Patient Instructions (Addendum)
Medication Instructions:  Your physician recommends that you continue on your current medications as directed. Please refer to the Current Medication list given to you today.  Labwork: None ordered.  Testing/Procedures: Your physician has requested that you have cardiac CT. Cardiac computed tomography (CT) is a painless test that uses an x-ray machine to take clear, detailed pictures of your heart. For further information please visit [facility]. Please follow instruction sheet as given. Your physician has recommended that you have an ablation. Catheter ablation is a medical procedure used to treat some cardiac arrhythmias (irregular heartbeats). During catheter ablation, a long, thin, flexible tube is put into a blood vessel in your groin (upper thigh), or neck. This tube is called an ablation catheter. It is then guided to your heart through the blood vessel. Radio frequency waves destroy small areas of heart tissue where abnormal heartbeats may cause an arrhythmia to start. Please see the instruction sheet given to you today.   Any Other Special Instructions Will Be Listed Below (If Applicable).  If you need a refill on your cardiac medications before your next appointment, please call your pharmacy.   Cardiac Ablation Cardiac ablation is a procedure to destroy (ablate) some heart tissue that is sending bad signals. These bad signals causeproblems in heart rhythm. The heart has many areas that make these signals. If there are problems in these areas, they can make the heart beat in a way that is not normal.Destroying some tissues can help make the heart rhythm normal. Tell your doctor about: Any allergies you have. All medicines you are taking. These include vitamins, herbs, eye drops, creams, and over-the-counter medicines. Any problems you or family members have had with medicines that make you fall asleep (anesthetics). Any blood disorders you have. Any surgeries you have  had. Any medical conditions you have, such as kidney failure. Whether you are pregnant or may be pregnant. What are the risks? This is a safe procedure. But problems may occur, including: Infection. Bruising and bleeding. Bleeding into the chest. Stroke or blood clots. Damage to nearby areas of your body. Allergies to medicines or dyes. The need for a pacemaker if the normal system is damaged. Failure of the procedure to treat the problem. What happens before the procedure? Medicines Ask your doctor about: Changing or stopping your normal medicines. This is important. Taking aspirin and ibuprofen. Do not take these medicines unless your doctor tells you to take them. Taking other medicines, vitamins, herbs, and supplements. General instructions Follow instructions from your doctor about what you cannot eat or drink. Plan to have someone take you home from the hospital or clinic. If you will be going home right after the procedure, plan to have someone with you for 24 hours. Ask your doctor what steps will be taken to prevent infection. What happens during the procedure?  An IV tube will be put into one of your veins. You will be given a medicine to help you relax. The skin on your neck or groin will be numbed. A cut (incision) will be made in your neck or groin. A needle will be put through your cut and into a large vein. A tube (catheter) will be put into the needle. The tube will be moved to your heart. Dye may be put through the tube. This helps your doctor see your heart. Small devices (electrodes) on the tube will send out signals. A type of energy will be used to destroy some heart tissue. The tube will be taken  out. Pressure will be held on your cut. This helps stop bleeding. A bandage will be put over your cut. The exact procedure may vary among doctors and hospitals. What happens after the procedure? You will be watched until you leave the hospital or clinic. This  includes checking your heart rate, breathing rate, oxygen, and blood pressure. Your cut will be watched for bleeding. You will need to lie still for a few hours. Do not drive for 24 hours or as long as your doctor tells you. Summary Cardiac ablation is a procedure to destroy some heart tissue. This is done to treat heart rhythm problems. Tell your doctor about any medical conditions you may have. Tell him or her about all medicines you are taking to treat them. This is a safe procedure. But problems may occur. These include infection, bruising, bleeding, and damage to nearby areas of your body. Follow what your doctor tells you about food and drink. You may also be told to change or stop some of your medicines. After the procedure, do not drive for 24 hours or as long as your doctor tells you. This information is not intended to replace advice given to you by your health care provider. Make sure you discuss any questions you have with your healthcare provider. Document Revised: 10/22/2019 Document Reviewed: 10/22/2019 Elsevier Patient Education  2022 [employer].

## 2021-08-03 ENCOUNTER — Telehealth: Payer: Self-pay | Admitting: *Deleted

## 2021-08-03 DIAGNOSIS — G4733 Obstructive sleep apnea (adult) (pediatric): Secondary | ICD-10-CM

## 2021-08-03 NOTE — Telephone Encounter (Signed)
-----   Message from Sueanne Margarita, MD sent at 07/23/2021  9:42 PM EDT ----- Please let patient know that they have sleep apnea.  Recommend therapeutic CPAP titration for treatment of patient's sleep disordered breathing.  If unable to perform an in lab titration then initiate ResMed auto CPAP from 4 to 15cm H2O with heated humidity and mask of choice and overnight pulse ox on CPAP.

## 2021-08-03 NOTE — Telephone Encounter (Signed)
The patient has been notified of the result and verbalized understanding.  All questions (if any) were answered. Marolyn Hammock, [place] 08/03/2021 3:44 PM     Titration to be scheduled No PA REQUIRED

## 2021-08-22 NOTE — Telephone Encounter (Signed)
Patient is scheduled for lab study on 08/31/21. Patient understands the sleep study will be done at [facility] sleep lab. Patient understands he will receive a sleep packet in a week or so. Patient understands to call if he does not receive the sleep packet in a timely manner.   @ [phone] OR [phone].

## 2021-08-23 ENCOUNTER — Ambulatory Visit (INDEPENDENT_AMBULATORY_CARE_PROVIDER_SITE_OTHER): Payer: Medicare Other | Admitting: Cardiology

## 2021-08-23 ENCOUNTER — Encounter: Payer: Self-pay | Admitting: Cardiology

## 2021-08-23 VITALS — BP 132/74 | HR 66 | Ht 65.0 in | Wt 216.6 lb

## 2021-08-23 DIAGNOSIS — E782 Mixed hyperlipidemia: Secondary | ICD-10-CM | POA: Diagnosis not present

## 2021-08-23 DIAGNOSIS — I480 Paroxysmal atrial fibrillation: Secondary | ICD-10-CM

## 2021-08-23 DIAGNOSIS — Z8679 Personal history of other diseases of the circulatory system: Secondary | ICD-10-CM | POA: Diagnosis not present

## 2021-08-23 NOTE — Progress Notes (Signed)
Cardiology Office Note  Date: 08/23/2021   ID: Stephanee, Barcomb [birth year], MRN [id number]  PCP:  Galen Manila, MD  Cardiologist:  Rozann Lesches, MD Electrophysiologist:  None   Chief Complaint  Patient presents with   Cardiac follow-up     History of Present Illness: Julie Jefferson is a 75 y.o. female former patient of Dr. Bronson Ing now presenting to establish follow-up with me.  I reviewed her records and updated the chart.  She was most recently seen by Dr. Rayann Heman in August.  She anticipates atrial fibrillation ablation with Dr. Rayann Heman in October.  She is scheduled for a preprocedure cardiac CT to investigate left atrial appendage. CHA2DS2-VASc score is 5.  She is on Eliquis for stroke prophylaxis and also stable dose of Lopressor.  She states that she has had 3 episodes of atrial fibrillation since her last encounter, she usually takes an extra Lopressor dose when this happens but it takes a few hours to several hours for the symptoms to resolve.  I reviewed her lab work from July as noted below.  Past Medical History:  Diagnosis Date   ACE-inhibitor cough    Aortic atherosclerosis (HCC)    Arthritis    Closed right ankle fracture 2016   Collagen vascular disease (HCC)    GERD (gastroesophageal reflux disease)    Glaucoma    History of breast cancer 2005   Left   Hypothyroidism    Mixed hyperlipidemia    Paroxysmal atrial fibrillation [facility])    Pelvic prolapse    Personal history of chemotherapy 2005   Personal history of radiation therapy 2005   Takotsubo cardiomyopathy 2008   cardiac cath 11/08. minimal coronary disease, anteroseptal akinesis with an EF 55%.  echo 2/09 EF 60-65% (LV function normalized)   Wears glasses     Past Surgical History:  Procedure Laterality Date   ANTERIOR AND POSTERIOR REPAIR WITH SACROSPINOUS FIXATION N/A 08/20/2019   Procedure: ANTERIOR AND POSTERIOR REPAIR WITH possible SACROSPINOUS FIXATION;  Surgeon: Everlene Farrier,  MD;  Location: [place];  Service: Gynecology;  Laterality: N/A;  need bed   BACK SURGERY     BREAST BIOPSY  01/03/2012   BREAST LUMPECTOMY Left 2005   left breast   BUNIONECTOMY     left   CHOLECYSTECTOMY     COLONOSCOPY     GANGLION CYST EXCISION     left   PARTIAL KNEE ARTHROPLASTY Right 12/28/2016   Procedure: RIGHT UNICOMPARTMENTAL KNEE;  Surgeon: Renette Butters, MD;  Location: [place];  Service: Orthopedics;  Laterality: Right;   PARTIAL KNEE ARTHROPLASTY Left 09/23/2018   Procedure: UNICOMPARTMENTAL LEFT  KNEE;  Surgeon: Renette Butters, MD;  Location: [facility];  Service: Orthopedics;  Laterality: Left;   TOTAL ABDOMINAL HYSTERECTOMY  1997   UPPER GI ENDOSCOPY      Current Outpatient Medications  Medication Sig Dispense Refill   acetaminophen (TYLENOL) 650 MG CR tablet Take 650 mg by mouth every 8 (eight) hours as needed for pain.     amLODipine (NORVASC) 5 MG tablet Take 5 mg by mouth daily.      apixaban (ELIQUIS) 5 MG TABS tablet TAKE 1 TABLET BY MOUTH TWICE A DAY 60 tablet 6   bimatoprost (LUMIGAN) 0.01 % SOLN Place 1 drop into both eyes at bedtime.     cycloSPORINE (RESTASIS) 0.05 % ophthalmic emulsion Apply 1 drop to eye daily.     levothyroxine (SYNTHROID) 112 MCG tablet  Take 112 mcg by mouth daily before breakfast.     meclizine (ANTIVERT) 25 MG tablet Take 25 mg by mouth 3 (three) times daily as needed for dizziness.     metoprolol tartrate (LOPRESSOR) 25 MG tablet Take 1 tablet (25 mg total) by mouth 2 (two) times daily. 180 tablet 3   Omeprazole (PRILOSEC PO) Take 40 mg by mouth 2 (two) times daily.     OVER THE COUNTER MEDICATION Take 1 capsule by mouth 2 (two) times daily. Hydro Eye Supplement     polyethylene glycol powder (GLYCOLAX/MIRALAX) powder Take 17 g by mouth daily as needed for mild constipation.      simvastatin (ZOCOR) 10 MG tablet TAKE 1 TABLET BY MOUTH EVERY DAY IN THE EVENING 90 tablet 1   sucralfate (CARAFATE) 1  g tablet Take 1 g by mouth 2 (two) times daily.     timolol (BETIMOL) 0.5 % ophthalmic solution Place 1 drop into both eyes 2 (two) times daily.      acetaminophen (TYLENOL) 325 MG tablet Take 2 tablets (650 mg total) by mouth every 6 (six) hours as needed for mild pain (temperature > 101.5.). (Patient not taking: Reported on 08/23/2021) 60 tablet 0   No current facility-administered medications for this visit.   Allergies:  Alphagan [brimonidine], Dorzolamide hcl, Penicillins, and Latex   ROS: No orthopnea or PND.  Physical Exam: VS:  BP 132/74   Pulse 66   Ht 5\' 5"  (1.651 m)   Wt 216 lb 9.6 oz (98.2 kg)   SpO2 96%   BMI 36.04 kg/m , BMI Body mass index is 36.04 kg/m.  Wt Readings from Last 3 Encounters:  08/23/21 216 lb 9.6 oz (98.2 kg)  07/24/21 207 lb (93.9 kg)  06/21/21 216 lb (98 kg)    General: Patient appears comfortable at rest. HEENT: Conjunctiva and lids normal, wearing a mask. Neck: Supple, no elevated JVP or carotid bruits, no thyromegaly. Lungs: Clear to auscultation, nonlabored breathing at rest. Cardiac: Regular rate and rhythm, no S3, 2/6 systolic murmur. Extremities: No pitting edema.  ECG:  An ECG dated 07/24/2021 was personally reviewed today and demonstrated:  Sinus rhythm.  Recent Labwork: 10/02/2020: BUN 19; Creatinine, Ser 0.77; Hemoglobin 12.2; Platelets 362; Potassium 3.4; Sodium 141  July 2022: Hemoglobin A1c 6.2%, hemoglobin 10.9, platelets 315, BUN 16, creatinine 0.85, potassium 4.0, AST 23, ALT 21, cholesterol 186, HDL 68, LDL 87, triglycerides 154, TSH 1.33  Other Studies Reviewed Today:  Lexiscan Myoview 05/14/2019: No diagnostic ST segment changes to indicate ischemia. Small, mild intensity, partially reversible apical anteroseptal defect that is most consistent with variable soft tissue attenuation. No large ischemic territories noted. This is a low risk study. Nuclear stress EF: 71%.  Echocardiogram 05/04/2020:  1. Left ventricular ejection  fraction, by estimation, is 60 to 65%. The  left ventricle has normal function. The left ventricle has no regional  wall motion abnormalities. Left ventricular diastolic parameters are  indeterminate.   2. Right ventricular systolic function is normal. The right ventricular  size is normal. Tricuspid regurgitation signal is inadequate for assessing  PA pressure.   3. The mitral valve is grossly normal. Trivial mitral valve  regurgitation.   4. The aortic valve is tricuspid. Aortic valve regurgitation is trivial.   5. The inferior vena cava is normal in size with greater than 50%  respiratory variability, suggesting right atrial pressure of 3 mmHg.   Assessment and Plan:  1.  Paroxysmal atrial fibrillation with CHA2DS2-VASc score of  5.  Plan is for her to undergo a cardiac CTA to assess left atrial appendage and then catheter ablation with Dr. Rayann Heman in October.  Continue Eliquis for stroke prophylaxis, no change in present Lopressor dose.  She is symptomatically stable and heart rate is regular today.  2.  History of Takotsubo cardiomyopathy in 2008 with subsequent normalization of function.  Echocardiogram from last June revealed LVEF 60 to 65%.  3.  Mixed hyperlipidemia, on Zocor.  Recent LDL 87.  Medication Adjustments/Labs and Tests Ordered: Current medicines are reviewed at length with the patient today.  Concerns regarding medicines are outlined above.   Tests Ordered: No orders of the defined types were placed in this encounter.   Medication Changes: No orders of the defined types were placed in this encounter.   Disposition:  Follow up  6 months.  Signed, Satira Sark, MD, [facility] 08/23/2021 2:16 PM    [facility] [place], [place], [place] [address] Phone: [phone]; Fax: [phone]

## 2021-08-23 NOTE — Patient Instructions (Signed)

## 2021-08-31 ENCOUNTER — Other Ambulatory Visit: Payer: Self-pay

## 2021-08-31 ENCOUNTER — Ambulatory Visit: Payer: Medicare Other | Admitting: Cardiology

## 2021-09-01 ENCOUNTER — Other Ambulatory Visit: Payer: Medicare Other | Admitting: *Deleted

## 2021-09-01 DIAGNOSIS — I10 Essential (primary) hypertension: Secondary | ICD-10-CM

## 2021-09-01 DIAGNOSIS — D6869 Other thrombophilia: Secondary | ICD-10-CM

## 2021-09-01 DIAGNOSIS — I480 Paroxysmal atrial fibrillation: Secondary | ICD-10-CM

## 2021-09-01 LAB — BASIC METABOLIC PANEL
BUN/Creatinine Ratio: 23 (ref 12–28)
BUN: 19 mg/dL (ref 8–27)
CO2: 21 mmol/L (ref 20–29)
Calcium: 9.7 mg/dL (ref 8.7–10.3)
Chloride: 101 mmol/L (ref 96–106)
Creatinine, Ser: 0.84 mg/dL (ref 0.57–1.00)
Glucose: 89 mg/dL (ref 70–99)
Potassium: 4.3 mmol/L (ref 3.5–5.2)
Sodium: 140 mmol/L (ref 134–144)
eGFR: 72 mL/min/{1.73_m2} (ref >59)

## 2021-09-01 LAB — CBC WITH DIFFERENTIAL/PLATELET
Basophils Absolute: 0.1 10*3/uL (ref 0.0–0.2)
Basos: 1 %
EOS (ABSOLUTE): 0.1 10*3/uL (ref 0.0–0.4)
Eos: 2 %
Hematocrit: 37.4 % (ref 34.0–46.6)
Hemoglobin: 11.7 g/dL (ref 11.1–15.9)
Immature Grans (Abs): 0 10*3/uL (ref 0.0–0.1)
Immature Granulocytes: 0 %
Lymphocytes Absolute: 2.3 10*3/uL (ref 0.7–3.1)
Lymphs: 43 %
MCH: 25.4 pg — ABNORMAL LOW (ref 26.6–33.0)
MCHC: 31.3 g/dL — ABNORMAL LOW (ref 31.5–35.7)
MCV: 81 fL (ref 79–97)
Monocytes Absolute: 0.4 10*3/uL (ref 0.1–0.9)
Monocytes: 8 %
Neutrophils Absolute: 2.5 10*3/uL (ref 1.4–7.0)
Neutrophils: 46 %
Platelets: 354 10*3/uL (ref 150–450)
RBC: 4.6 x10E6/uL (ref 3.77–5.28)
RDW: 14.3 % (ref 11.7–15.4)
WBC: 5.4 10*3/uL (ref 3.4–10.8)

## 2021-09-13 ENCOUNTER — Telehealth (HOSPITAL_COMMUNITY): Payer: Self-pay | Admitting: *Deleted

## 2021-09-13 NOTE — Telephone Encounter (Signed)
Reaching out to patient to offer assistance regarding upcoming cardiac imaging study; pt verbalizes understanding of appt date/time, parking situation and where to check in, pre-test NPO status and medications ordered, and verified current allergies; name and call back number provided for further questions should they arise  Verneice Caspers RN Navigator Cardiac Imaging [facility] [phone] office [phone] cell  Patient to take 25mg metoprolol tartrate two hours prior to cardiac CT scan. 

## 2021-09-14 ENCOUNTER — Ambulatory Visit (HOSPITAL_COMMUNITY)
Admission: RE | Admit: 2021-09-14 | Discharge: 2021-09-14 | Disposition: A | Discharge status: Home / Self Care | Payer: Medicare Other | Source: Ambulatory Visit | Attending: Internal Medicine | Admitting: Internal Medicine

## 2021-09-14 ENCOUNTER — Other Ambulatory Visit: Payer: Self-pay

## 2021-09-14 DIAGNOSIS — I480 Paroxysmal atrial fibrillation: Secondary | ICD-10-CM | POA: Insufficient documentation

## 2021-09-14 MED ORDER — IOHEXOL 350 MG/ML SOLN
95.0000 mL | Freq: Once | INTRAVENOUS | Status: AC | PRN
  Administered 2021-09-14: 95 mL via INTRAVENOUS

## 2021-09-17 ENCOUNTER — Other Ambulatory Visit: Payer: Self-pay

## 2021-09-17 ENCOUNTER — Ambulatory Visit: Payer: Medicare Other | Attending: Cardiology | Admitting: Cardiology

## 2021-09-17 DIAGNOSIS — G4733 Obstructive sleep apnea (adult) (pediatric): Secondary | ICD-10-CM

## 2021-09-20 NOTE — Anesthesia Preprocedure Evaluation (Addendum)
Anesthesia Evaluation  Patient identified by MRN, date of birth, ID band Patient awake    Reviewed: Allergy & Precautions, H&P , NPO status , Patient's Chart, lab work & pertinent test results  Airway Mallampati: II  TM Distance: >3 FB Neck ROM: Full    Dental no notable dental hx. (+) Teeth Intact, Dental Advisory Given   Pulmonary neg pulmonary ROS,    Pulmonary exam normal breath sounds clear to auscultation       Cardiovascular Exercise Tolerance: Good hypertension, Pt. on medications + dysrhythmias Atrial Fibrillation  Rhythm:Regular Rate:Normal     Neuro/Psych negative neurological ROS  negative psych ROS   GI/Hepatic Neg liver ROS, GERD  Medicated,  Endo/Other  Hypothyroidism   Renal/GU negative Renal ROS  negative genitourinary   Musculoskeletal  (+) Arthritis , Osteoarthritis,    Abdominal   Peds  Hematology negative hematology ROS (+)   Anesthesia Other Findings   Reproductive/Obstetrics negative OB ROS                            Anesthesia Physical Anesthesia Plan  ASA: 3  Anesthesia Plan: General   Post-op Pain Management:    Induction: Intravenous  PONV Risk Score and Plan: 4 or greater and Dexamethasone, Ondansetron and Midazolam  Airway Management Planned: Oral ETT  Additional Equipment:   Intra-op Plan:   Post-operative Plan: Extubation in OR  Informed Consent: I have reviewed the patients History and Physical, chart, labs and discussed the procedure including the risks, benefits and alternatives for the proposed anesthesia with the patient or authorized representative who has indicated his/her understanding and acceptance.     Dental advisory given  Plan Discussed with: CRNA  Anesthesia Plan Comments:        Anesthesia Quick Evaluation

## 2021-09-20 NOTE — Pre-Procedure Instructions (Signed)
Attempted to call patient regarding procedure instructions.  Let voice mail on the following items: Arrival time 0530 Nothing to eat or drink after midnight No meds AM of procedure Responsible person to drive you home and stay with you for 24 hrs  Have you missed any doses of anti-coagulant Eliquis- take both doses today, none in the morning

## 2021-09-21 ENCOUNTER — Encounter (HOSPITAL_COMMUNITY)
Admission: RE | Disposition: A | Discharge status: Home / Self Care | Payer: Medicare Other | Source: Home / Self Care | Attending: Internal Medicine

## 2021-09-21 ENCOUNTER — Ambulatory Visit (HOSPITAL_COMMUNITY): Payer: Medicare Other | Admitting: Anesthesiology

## 2021-09-21 ENCOUNTER — Other Ambulatory Visit: Payer: Self-pay

## 2021-09-21 ENCOUNTER — Ambulatory Visit (HOSPITAL_COMMUNITY)
Admission: RE | Admit: 2021-09-21 | Discharge: 2021-09-21 | Disposition: A | Discharge status: Home / Self Care | Payer: Medicare Other | Attending: Internal Medicine | Admitting: Internal Medicine

## 2021-09-21 ENCOUNTER — Encounter (HOSPITAL_COMMUNITY): Payer: Self-pay | Admitting: Internal Medicine

## 2021-09-21 DIAGNOSIS — Z8249 Family history of ischemic heart disease and other diseases of the circulatory system: Secondary | ICD-10-CM | POA: Insufficient documentation

## 2021-09-21 DIAGNOSIS — Z79890 Hormone replacement therapy (postmenopausal): Secondary | ICD-10-CM | POA: Insufficient documentation

## 2021-09-21 DIAGNOSIS — Z880 Allergy status to penicillin: Secondary | ICD-10-CM | POA: Insufficient documentation

## 2021-09-21 DIAGNOSIS — I480 Paroxysmal atrial fibrillation: Secondary | ICD-10-CM

## 2021-09-21 DIAGNOSIS — Z888 Allergy status to other drugs, medicaments and biological substances status: Secondary | ICD-10-CM | POA: Diagnosis not present

## 2021-09-21 DIAGNOSIS — Z91040 Latex allergy status: Secondary | ICD-10-CM | POA: Insufficient documentation

## 2021-09-21 DIAGNOSIS — Z7901 Long term (current) use of anticoagulants: Secondary | ICD-10-CM | POA: Diagnosis not present

## 2021-09-21 DIAGNOSIS — Z79899 Other long term (current) drug therapy: Secondary | ICD-10-CM | POA: Insufficient documentation

## 2021-09-21 HISTORY — PX: ATRIAL FIBRILLATION ABLATION: EP1191

## 2021-09-21 LAB — POCT ACTIVATED CLOTTING TIME
Activated Clotting Time: 341 seconds
Activated Clotting Time: 329 seconds
Activated Clotting Time: 341 seconds

## 2021-09-21 SURGERY — ATRIAL FIBRILLATION ABLATION
Anesthesia: General

## 2021-09-21 MED ORDER — HEPARIN SODIUM (PORCINE) 1000 UNIT/ML IJ SOLN
INTRAMUSCULAR | Status: DC | PRN
  Administered 2021-09-21: 1000 [IU] via INTRAVENOUS
  Administered 2021-09-21: 15000 [IU] via INTRAVENOUS

## 2021-09-21 MED ORDER — ROCURONIUM BROMIDE 10 MG/ML (PF) SYRINGE
PREFILLED_SYRINGE | INTRAVENOUS | Status: DC | PRN
  Administered 2021-09-21: 20 mg via INTRAVENOUS
  Administered 2021-09-21: 60 mg via INTRAVENOUS

## 2021-09-21 MED ORDER — EPHEDRINE SULFATE-NACL 50-0.9 MG/10ML-% IV SOSY
PREFILLED_SYRINGE | INTRAVENOUS | Status: DC | PRN
  Administered 2021-09-21 (×2): 5 mg via INTRAVENOUS

## 2021-09-21 MED ORDER — HEPARIN (PORCINE) IN NACL 1000-0.9 UT/500ML-% IV SOLN
INTRAVENOUS | Status: AC
  Filled 2021-09-21: qty 1500

## 2021-09-21 MED ORDER — FENTANYL CITRATE (PF) 250 MCG/5ML IJ SOLN
INTRAMUSCULAR | Status: DC | PRN
  Administered 2021-09-21: 100 ug via INTRAVENOUS

## 2021-09-21 MED ORDER — HEPARIN (PORCINE) IN NACL 1000-0.9 UT/500ML-% IV SOLN
INTRAVENOUS | Status: DC | PRN
  Administered 2021-09-21 (×3): 500 mL

## 2021-09-21 MED ORDER — APIXABAN 5 MG PO TABS
5.0000 mg | ORAL_TABLET | Freq: Once | ORAL | Status: AC
  Administered 2021-09-21: 5 mg via ORAL
  Filled 2021-09-21: qty 1

## 2021-09-21 MED ORDER — ISOPROTERENOL HCL 0.2 MG/ML IJ SOLN
INTRAMUSCULAR | Status: AC
  Filled 2021-09-21: qty 5

## 2021-09-21 MED ORDER — SUGAMMADEX SODIUM 200 MG/2ML IV SOLN
INTRAVENOUS | Status: DC | PRN
  Administered 2021-09-21: 200 mg via INTRAVENOUS

## 2021-09-21 MED ORDER — ONDANSETRON HCL 4 MG/2ML IJ SOLN
INTRAMUSCULAR | Status: DC | PRN
Start: 2021-09-21 — End: 2021-09-21
  Administered 2021-09-21: 4 mg via INTRAVENOUS

## 2021-09-21 MED ORDER — ONDANSETRON HCL 4 MG/2ML IJ SOLN: 4.0000 mg | Freq: Four times a day (QID) | INTRAMUSCULAR | Status: DC | PRN

## 2021-09-21 MED ORDER — PROPOFOL 10 MG/ML IV BOLUS
INTRAVENOUS | Status: DC | PRN
  Administered 2021-09-21: 120 mg via INTRAVENOUS

## 2021-09-21 MED ORDER — HEPARIN SODIUM (PORCINE) 1000 UNIT/ML IJ SOLN
INTRAMUSCULAR | Status: AC
  Filled 2021-09-21: qty 2

## 2021-09-21 MED ORDER — HYDROCODONE-ACETAMINOPHEN 5-325 MG PO TABS: 1.0000 | ORAL_TABLET | ORAL | Status: DC | PRN

## 2021-09-21 MED ORDER — ISOPROTERENOL HCL 0.2 MG/ML IJ SOLN
INTRAVENOUS | Status: DC | PRN
  Administered 2021-09-21: 20 ug/min via INTRAVENOUS

## 2021-09-21 MED ORDER — PHENYLEPHRINE HCL-NACL 20-0.9 MG/250ML-% IV SOLN
INTRAVENOUS | Status: DC | PRN
  Administered 2021-09-21: 40 ug/min via INTRAVENOUS

## 2021-09-21 MED ORDER — SODIUM CHLORIDE 0.9 % IV SOLN: INTRAVENOUS | Status: DC

## 2021-09-21 MED ORDER — PROTAMINE SULFATE 10 MG/ML IV SOLN
INTRAVENOUS | Status: DC | PRN
  Administered 2021-09-21: 40 mg via INTRAVENOUS

## 2021-09-21 MED ORDER — ACETAMINOPHEN 325 MG PO TABS: 650.0000 mg | ORAL_TABLET | ORAL | Status: DC | PRN

## 2021-09-21 MED ORDER — HEPARIN SODIUM (PORCINE) 1000 UNIT/ML IJ SOLN
INTRAMUSCULAR | Status: DC | PRN
  Administered 2021-09-21 (×2): 1000 [IU] via INTRAVENOUS

## 2021-09-21 MED ORDER — SODIUM CHLORIDE 0.9% FLUSH: 3.0000 mL | Freq: Two times a day (BID) | INTRAVENOUS | Status: DC

## 2021-09-21 MED ORDER — LIDOCAINE 2% (20 MG/ML) 5 ML SYRINGE
INTRAMUSCULAR | Status: DC | PRN
  Administered 2021-09-21: 60 mg via INTRAVENOUS

## 2021-09-21 MED ORDER — DEXAMETHASONE SODIUM PHOSPHATE 10 MG/ML IJ SOLN
INTRAMUSCULAR | Status: DC | PRN
  Administered 2021-09-21: 10 mg via INTRAVENOUS

## 2021-09-21 MED ORDER — SODIUM CHLORIDE 0.9 % IV SOLN: 250.0000 mL | INTRAVENOUS | Status: DC | PRN

## 2021-09-21 MED ORDER — ACETAMINOPHEN 500 MG PO TABS
1000.0000 mg | ORAL_TABLET | Freq: Once | ORAL | Status: AC
  Administered 2021-09-21: 1000 mg via ORAL
  Filled 2021-09-21: qty 2

## 2021-09-21 MED ORDER — SODIUM CHLORIDE 0.9% FLUSH: 3.0000 mL | INTRAVENOUS | Status: DC | PRN

## 2021-09-21 SURGICAL SUPPLY — 17 items
BLANKET WARM UNDERBOD FULL ACC (MISCELLANEOUS) ×2 IMPLANT
CATH OCTARAY 2.0 F 3-3-3-3-3 (CATHETERS) ×1 IMPLANT
CATH SMTCH THERMOCOOL SF DF (CATHETERS) ×1 IMPLANT
CATH SOUNDSTAR ECO 8FR (CATHETERS) ×1 IMPLANT
CATH WEB BI DIR CSDF CRV REPRO (CATHETERS) ×1 IMPLANT
CLOSURE PERCLOSE PROSTYLE (VASCULAR PRODUCTS) ×3 IMPLANT
COVER SWIFTLINK CONNECTOR (BAG) ×2 IMPLANT
NDL BAYLIS TRANSSEPTAL 71CM (NEEDLE) IMPLANT
NEEDLE BAYLIS TRANSSEPTAL 71CM (NEEDLE) ×2 IMPLANT
PACK EP LATEX FREE (CUSTOM PROCEDURE TRAY) ×2
PACK EP LF (CUSTOM PROCEDURE TRAY) ×1 IMPLANT
PAD PRO RADIOLUCENT 2001M-C (PAD) ×2 IMPLANT
PATCH CARTO3 (PAD) ×1 IMPLANT
SHEATH PINNACLE 7F 10CM (SHEATH) ×2 IMPLANT
SHEATH PINNACLE 9F 10CM (SHEATH) ×1 IMPLANT
SHEATH SWARTZ TS SL2 63CM 8.5F (SHEATH) ×1 IMPLANT
TUBING SMART ABLATE COOLFLOW (TUBING) ×1 IMPLANT

## 2021-09-21 NOTE — Anesthesia Procedure Notes (Addendum)
Procedure Name: Intubation Date/Time: 09/21/2021 7:45 AM Performed by: Ardyth Harps, CRNA Pre-anesthesia Checklist: Patient identified, Emergency Drugs available, Suction available and Patient being monitored Patient Re-evaluated:Patient Re-evaluated prior to induction Oxygen Delivery Method: Circle System Utilized Preoxygenation: Pre-oxygenation with 100% oxygen Induction Type: IV induction Ventilation: Mask ventilation without difficulty and Oral airway inserted - appropriate to patient size Laryngoscope Size: Mac and 4 Grade View: Grade I Tube type: Oral Tube size: 7.0 mm Number of attempts: 1 Airway Equipment and Method: Stylet and Oral airway Placement Confirmation: ETT inserted through vocal cords under direct vision, positive ETCO2 and breath sounds checked- equal and bilateral Secured at: 21 cm Tube secured with: Tape Dental Injury: Teeth and Oropharynx as per pre-operative assessment

## 2021-09-21 NOTE — Discharge Instructions (Signed)

## 2021-09-21 NOTE — Anesthesia Postprocedure Evaluation (Signed)
Anesthesia Post Note  Patient: Julie Jefferson  Procedure(s) Performed: ATRIAL FIBRILLATION ABLATION     Patient location during evaluation: PACU Anesthesia Type: General Level of consciousness: awake and alert Pain management: pain level controlled Vital Signs Assessment: post-procedure vital signs reviewed and stable Respiratory status: spontaneous breathing, nonlabored ventilation and respiratory function stable Cardiovascular status: blood pressure returned to baseline and stable Postop Assessment: no apparent nausea or vomiting Anesthetic complications: no   No notable events documented.  Last Vitals:  Vitals:   09/21/21 1130 09/21/21 1200  BP: (!) 153/66 (!) 142/64  Pulse: (!) 58 65  Resp: 14 13  Temp:    SpO2: 99% 96%    Last Pain:  Vitals:   09/21/21 1054  TempSrc:   PainSc: 1                  Shahil Speegle,W. EDMOND

## 2021-09-21 NOTE — Transfer of Care (Signed)
Immediate Anesthesia Transfer of Care Note  Patient: Julie Jefferson  Procedure(s) Performed: ATRIAL FIBRILLATION ABLATION  Patient Location: Cath Lab  Anesthesia Type:General  Level of Consciousness: awake and alert   Airway & Oxygen Therapy: Patient Spontanous Breathing  Post-op Assessment: Report given to RN and Post -op Vital signs reviewed and stable  Post vital signs: Reviewed and stable  Last Vitals:  Vitals Value Taken Time  BP 154/59 09/21/21 0948  Temp    Pulse 69 09/21/21 0949  Resp 9 09/21/21 0948  SpO2 96 % 09/21/21 0949  Vitals shown include unvalidated device data.  Last Pain:  Vitals:   09/21/21 0615  TempSrc:   PainSc: 0-No pain         Complications: No notable events documented.

## 2021-09-21 NOTE — H&P (Signed)
CC: afib   History of Present Illness: Julie Jefferson is a 75 y.o. female who presents today for electrophysiology study and ablation for afib.    She was diagnosed with afib 10/21 after presenting with tachypalpitations.  She was placed on eliquis.   She has done reasonably well. She continues to have episodes of afib.  + SOB and fatigue with episodes. + snores Today, she denies symptoms of palpitations, chest pain,  orthopnea, PND, lower extremity edema, claudication, dizziness, presyncope, syncope, bleeding, or neurologic sequela. The patient is tolerating medications without difficulties and is otherwise without complaint today.          Past Medical History:  Diagnosis Date   Aortic atherosclerosis (HCC)     Arthritis      rt knee, back, hands   Bilateral lower extremity edema     Breast cancer ([place]) 2005    left   Chest pain      February, 2014   Closed right ankle fracture 2016   Collagen vascular disease ([place])     Cough      ACE cough 2012   Ejection fraction      EF 60-65%, echo, February, 2009, (LV function normalized).   GERD (gastroesophageal reflux disease)     Glaucoma     History of anemia      age 41's   History of breast cancer      left   HLD (hyperlipidemia)      mixed   Hypothyroidism     Pelvic prolapse     Personal history of chemotherapy 2005   Personal history of radiation therapy 2005   Pre-syncope      Hospital, June, 2014, probably dehydration   Takotsubo cardiomyopathy 2008    cardiac cath 11/08. minimal coronary disease, anteroseptal akinesis with an EF 55%.  echo 2/09 EF 60-65% (LV function normalized)   Wears glasses           Past Surgical History:  Procedure Laterality Date   ANTERIOR AND POSTERIOR REPAIR WITH SACROSPINOUS FIXATION N/A 08/20/2019    Procedure: ANTERIOR AND POSTERIOR REPAIR WITH possible SACROSPINOUS FIXATION;  Surgeon: Everlene Farrier, MD;  Location: [place];  Service: Gynecology;  Laterality: N/A;   need bed   BACK SURGERY       BREAST BIOPSY   01/03/2012   BREAST LUMPECTOMY Left 2005    left breast   BUNIONECTOMY        left   CHOLECYSTECTOMY       COLONOSCOPY       GANGLION CYST EXCISION        left   PARTIAL KNEE ARTHROPLASTY Right 12/28/2016    Procedure: RIGHT UNICOMPARTMENTAL KNEE;  Surgeon: Renette Butters, MD;  Location: [place];  Service: Orthopedics;  Laterality: Right;   PARTIAL KNEE ARTHROPLASTY Left 09/23/2018    Procedure: UNICOMPARTMENTAL LEFT  KNEE;  Surgeon: Renette Butters, MD;  Location[facility];  Service: Orthopedics;  Laterality: Left;   TOTAL ABDOMINAL HYSTERECTOMY   1997   UPPER GI ENDOSCOPY                  Current Outpatient Medications  Medication Sig Dispense Refill   cycloSPORINE (RESTASIS) 0.05 % ophthalmic emulsion Apply 1 drop to eye daily.       acetaminophen (TYLENOL) 325 MG tablet Take 2 tablets (650 mg total) by mouth every 6 (six) hours as needed for mild pain (temperature > 101.5.). 60 tablet  0   amLODipine (NORVASC) 5 MG tablet Take 5 mg by mouth daily.        apixaban (ELIQUIS) 5 MG TABS tablet TAKE 1 TABLET BY MOUTH TWICE A DAY 60 tablet 6   bimatoprost (LUMIGAN) 0.01 % SOLN Place 1 drop into both eyes at bedtime.       levothyroxine (SYNTHROID) 112 MCG tablet Take 112 mcg by mouth daily before breakfast.       meclizine (ANTIVERT) 25 MG tablet Take 25 mg by mouth 3 (three) times daily as needed for dizziness.       metoprolol tartrate (LOPRESSOR) 25 MG tablet Take 1 tablet (25 mg total) by mouth 2 (two) times daily. 180 tablet 3   Omeprazole (PRILOSEC PO) Take 40 mg by mouth 2 (two) times daily.       OVER THE COUNTER MEDICATION Take 1 capsule by mouth 2 (two) times daily. Hydro Eye Supplement       polyethylene glycol powder (GLYCOLAX/MIRALAX) powder Take 17 g by mouth daily as needed for mild constipation.        simvastatin (ZOCOR) 10 MG tablet TAKE 1 TABLET BY MOUTH EVERY DAY IN THE EVENING 90 tablet 1    sucralfate (CARAFATE) 1 g tablet Take 1 g by mouth 2 (two) times daily.       timolol (BETIMOL) 0.5 % ophthalmic solution Place 1 drop into both eyes 2 (two) times daily.         No current facility-administered medications for this visit.      Allergies:   Alphagan [brimonidine], Dorzolamide hcl, Penicillins, and Latex    Social History:  The patient  reports that she has never smoked. She has never used smokeless tobacco. She reports that she does not drink alcohol and does not use drugs.    Family History:  The patient's family history includes Breast cancer in her maternal aunt, mother, and sister; Cancer in her sister; Cancer (age of onset: 72) in her maternal aunt and mother; Coronary artery disease in an other family member; Heart disease in her father.      ROS:  Please see the history of present illness.   All other systems are personally reviewed and negative.      PHYSICAL EXAM: Vitals:   09/21/21 0553  BP: (!) 143/70  Pulse: 61  Temp: 97.8 F (36.6 C)  SpO2: 98%    GEN: Well nourished, well developed, in no acute distress HEENT: normal Neck: no JVD, carotid bruits, or masses Cardiac: RRR; no murmurs, rubs, or gallops,no edema  Respiratory:  clear to auscultation bilaterally, normal work of breathing GI: soft, nontender, nondistended, + BS MS: no deformity or atrophy Skin: warm and dry  Neuro:  Strength and sensation are intact Psych: euthymic mood, full affect       Other studies personally reviewed: Additional studies/ records that were reviewed today include: AF clinic notes, prior echo  Review of the above records today demonstrates: as above   Echo 05/04/20- EF 60%,    ASSESSMENT AND PLAN:   1.  Paroxysmal atrial fibrillation The patient has symptomatic, recurrent  atrial fibrillation.   Chads2vasc score is 5.  she is anticoagulated with eliquis .    Risk, benefits, and alternatives to EP study and radiofrequency ablation for afib were again  discussed in detail today. These risks include but are not limited to stroke, bleeding, vascular damage, tamponade, perforation, damage to the esophagus, lungs, and other structures, pulmonary vein stenosis, worsening renal function,  and death. The patient understands these risk and wishes to proceed.    Cardiac CT reviewed at length with the patient today.  she reports compliance with [place] without interruption.  Thompson Grayer MD, [place] 09/21/2021 7:24 AM

## 2021-09-22 ENCOUNTER — Encounter (HOSPITAL_COMMUNITY): Payer: Self-pay | Admitting: Internal Medicine

## 2021-09-25 NOTE — Procedures (Signed)
   Patient Name: Julie Jefferson, [place] Date: 09/17/2021 Gender: Female D.O.B: 02-03-1946 Age (years): 29 Referring Provider: Malka So PA Height (inches): 65 Interpreting Physician: Fransico Him MD, ABSM Weight (lbs): 209 RPSGT: Rosebud Poles BMI: 35 MRN: [id number] Neck Size: 14.00  CLINICAL INFORMATION The patient is referred for a CPAP titration to treat sleep apnea.  SLEEP STUDY TECHNIQUE As per the AASM Manual for the Scoring of Sleep and Associated Events v2.3 (April 2016) with a hypopnea requiring 4% desaturations.  The channels recorded and monitored were frontal, central and occipital EEG, electrooculogram (EOG), submentalis EMG (chin), nasal and oral airflow, thoracic and abdominal wall motion, anterior tibialis EMG, snore microphone, electrocardiogram, and pulse oximetry. Continuous positive airway pressure (CPAP) was initiated at the beginning of the study and titrated to treat sleep-disordered breathing.  MEDICATIONS Medications self-administered by patient taken the night of the study : N/A  TECHNICIAN COMMENTS Comments added by technician: Patient tolerated CPAP very well. CPAP therapy started at 4 cm of H2O and increased to 6 cm of H2O due to events and snoring episodes. PLMS noticed, associated with significant EEG arousals at times. Soft snoring noticed throughout Comments added by scorer: N/A  RESPIRATORY PARAMETERS Optimal PAP Pressure (cm): 6  AHI at Optimal Pressure (/hr):N/A Overall Minimal O2 (%):90.00  Supine % at Optimal Pressure (%):N/A Minimal O2 at Optimal Pressure (%): 90.00   SLEEP ARCHITECTURE The study was initiated at 10:54:01 PM and ended at 5:32:35 AM.  Sleep onset time was 18.3 minutes and the sleep efficiency was 88.1%. The total sleep time was 351.3 minutes.  The patient spent 5.27% of the night in stage N1 sleep, 44.55% in stage N2 sleep, 21.49% in stage N3 and 28.7% in REM.Stage REM latency was 65.0 minutes  Wake after sleep  onset was 29.0. Alpha intrusion was absent. Supine sleep was 48.73%.  CARDIAC DATA The 2 lead EKG demonstrated sinus rhythm. The mean heart rate was 55.59 beats per minute. Other EKG findings include: None.  LEG MOVEMENT DATA The total Periodic Limb Movements of Sleep (PLMS) were 359. The PLMS index was 61.32. A PLMS index of <15 is considered normal in adults.  IMPRESSIONS - The optimal PAP pressure was 6 cm of water. - Central sleep apnea was not noted during this titration (CAI = 0.7/h). - Significant oxygen desaturations were not observed during this titration (min O2 = 90.00%). - The patient snored with soft snoring volume during this titration study. - No cardiac abnormalities were observed during this study. - Severe periodic limb movements were observed during this study. Arousals associated with PLMs were rare.  DIAGNOSIS - Obstructive Sleep Apnea (G47.33)  RECOMMENDATIONS - Trial of CPAP therapy on 6 cm H2O with a Small size Resmed Nasal Cradle Mask AirFit N30 mask and heated humidification. - Avoid alcohol, sedatives and other CNS depressants that may worsen sleep apnea and disrupt normal sleep architecture. - Sleep hygiene should be reviewed to assess factors that may improve sleep quality. - Weight management and regular exercise should be initiated or continued. - Return to Sleep Center for re-evaluation after 4 weeks of therapy  [Electronically signed] 09/25/2021 08:29 AM  Fransico Him MD, ABSM Diplomate, American Board of Sleep Medicine

## 2021-09-27 ENCOUNTER — Telehealth: Payer: Self-pay | Admitting: *Deleted

## 2021-09-27 NOTE — Telephone Encounter (Signed)
The patient has been notified of the result and verbalized understanding.  All questions (if any) were answered. Marolyn Hammock, [place] 09/27/2021 4:25 PM    Upon patient request DME selection is [facility] Patient understands he will be contacted by [facility] to set up his cpap. Patient understands to call if [facility] does not contact him with new setup in a timely manner. Patient understands they will be called once confirmation has been received from [facility] that they have received their new machine to schedule 10 week follow up appointment.   [facility] notified of new cpap order  Please add to airview Patient was grateful for the call and thanked me.

## 2021-09-27 NOTE — Telephone Encounter (Signed)
-----   Message from Sueanne Margarita, MD sent at 09/25/2021  8:32 AM EDT ----- Please let patient know that they had a successful PAP titration and let DME know that orders are in EPIC.  Please set up 6 week OV with me.

## 2021-10-19 ENCOUNTER — Other Ambulatory Visit: Payer: Self-pay

## 2021-10-19 ENCOUNTER — Ambulatory Visit (HOSPITAL_COMMUNITY)
Admission: RE | Admit: 2021-10-19 | Discharge: 2021-10-19 | Disposition: A | Discharge status: Home / Self Care | Payer: Medicare Other | Source: Ambulatory Visit | Attending: Physician Assistant | Admitting: Physician Assistant

## 2021-10-19 ENCOUNTER — Encounter (HOSPITAL_COMMUNITY): Payer: Self-pay | Admitting: Physician Assistant

## 2021-10-19 VITALS — BP 136/70 | HR 64 | Ht 65.0 in | Wt 213.0 lb

## 2021-10-19 DIAGNOSIS — E785 Hyperlipidemia, unspecified: Secondary | ICD-10-CM | POA: Insufficient documentation

## 2021-10-19 DIAGNOSIS — Z6835 Body mass index (BMI) 35.0-35.9, adult: Secondary | ICD-10-CM | POA: Insufficient documentation

## 2021-10-19 DIAGNOSIS — I5181 Takotsubo syndrome: Secondary | ICD-10-CM | POA: Insufficient documentation

## 2021-10-19 DIAGNOSIS — I480 Paroxysmal atrial fibrillation: Secondary | ICD-10-CM | POA: Diagnosis present

## 2021-10-19 DIAGNOSIS — G4733 Obstructive sleep apnea (adult) (pediatric): Secondary | ICD-10-CM | POA: Diagnosis not present

## 2021-10-19 DIAGNOSIS — E669 Obesity, unspecified: Secondary | ICD-10-CM | POA: Insufficient documentation

## 2021-10-19 DIAGNOSIS — Z7182 Exercise counseling: Secondary | ICD-10-CM | POA: Diagnosis not present

## 2021-10-19 DIAGNOSIS — Z79899 Other long term (current) drug therapy: Secondary | ICD-10-CM | POA: Insufficient documentation

## 2021-10-19 DIAGNOSIS — I10 Essential (primary) hypertension: Secondary | ICD-10-CM | POA: Insufficient documentation

## 2021-10-19 DIAGNOSIS — I700 Atherosclerosis of aorta: Secondary | ICD-10-CM | POA: Diagnosis not present

## 2021-10-19 DIAGNOSIS — D6869 Other thrombophilia: Secondary | ICD-10-CM | POA: Insufficient documentation

## 2021-10-19 DIAGNOSIS — R002 Palpitations: Secondary | ICD-10-CM | POA: Insufficient documentation

## 2021-10-19 DIAGNOSIS — Z7901 Long term (current) use of anticoagulants: Secondary | ICD-10-CM | POA: Insufficient documentation

## 2021-10-19 NOTE — Progress Notes (Signed)
Primary Care Physician: Galen Manila, MD Primary Cardiologist: Dr Domenic Polite Primary Electrophysiologist: Dr Rayann Heman Referring Physician: Dr Rolly Pancake is a 75 y.o. female with a history of HTN, OSA, HLD, aortic atherosclerosis, stress CM 2008, atrial fibrillation who presents for follow up in the [facility].  The patient was initially diagnosed with atrial fibrillation 09/2020 after presenting to the ED with symptoms of palpitations and chest heaviness. She spontaneously converted before leaving the ED. Patient is on Eliquis for a CHADS2VASC score of 5. Since that time, she has had increasing frequency of episodes with palpitations and exercise intolerance. She has taken extra PRN doses of BB which help to convert her to SR but also make her presyncopal and extremely fatigued.   On follow up today, patient is s/p afib ablation with Dr Rayann Heman on 09/21/21. She reports that she has done well since her procedure with only rare and very brief palpitations, greatly improved since ablation. She denies CP, swallowing pain, or groin issues.   Today, she denies symptoms of palpitations, chest pain, shortness of breath, orthopnea, PND, dizziness, syncope, bleeding, or neurologic sequela. The patient is tolerating medications without difficulties and is otherwise without complaint today.    Atrial Fibrillation Risk Factors:  she does have symptoms or diagnosis of sleep apnea. She is waiting on her CPAP machine. she does not have a history of rheumatic fever. she does not have a history of alcohol use. The patient does not have a history of early familial atrial fibrillation or other arrhythmias.  she has a BMI of Body mass index is 35.45 kg/m.Marland Kitchen Filed Weights   10/19/21 1116  Weight: 96.6 kg     Family History  Problem Relation Age of Onset   Cancer Mother 63       breast   Breast cancer Mother    Heart disease Father    Coronary artery disease  Other        family hx   Cancer Sister        LCIS 23   Breast cancer Sister    Cancer Maternal Aunt 75       breast   Breast cancer Maternal Aunt      Atrial Fibrillation Management history:  Previous antiarrhythmic drugs: none Previous cardioversions: none Previous ablations: 09/21/21 CHADS2VASC score: 5 Anticoagulation history: Eliquis   Past Medical History:  Diagnosis Date   ACE-inhibitor cough    Aortic atherosclerosis (HCC)    Arthritis    Closed right ankle fracture 2016   Collagen vascular disease ([place])    GERD (gastroesophageal reflux disease)    Glaucoma    History of breast cancer 2005   Left   Hypothyroidism    Mixed hyperlipidemia    Paroxysmal atrial fibrillation ([place])    Pelvic prolapse    Personal history of chemotherapy 2005   Personal history of radiation therapy 2005   Takotsubo cardiomyopathy 2008   cardiac cath 11/08. minimal coronary disease, anteroseptal akinesis with an EF 55%.  echo 2/09 EF 60-65% (LV function normalized)   Wears glasses    Past Surgical History:  Procedure Laterality Date   ANTERIOR AND POSTERIOR REPAIR WITH SACROSPINOUS FIXATION N/A 08/20/2019   Procedure: ANTERIOR AND POSTERIOR REPAIR WITH possible SACROSPINOUS FIXATION;  Surgeon: Everlene Farrier, MD;  Location: [place];  Service: Gynecology;  Laterality: N/A;  need bed   ATRIAL FIBRILLATION ABLATION N/A 09/21/2021   Procedure: ATRIAL FIBRILLATION ABLATION;  Surgeon:  Thompson Grayer, MD;  Location: [facility];  Service: Cardiovascular;  Laterality: N/A;   BACK SURGERY     BREAST BIOPSY  01/03/2012   BREAST LUMPECTOMY Left 2005   left breast   BUNIONECTOMY     left   CHOLECYSTECTOMY     COLONOSCOPY     GANGLION CYST EXCISION     left   PARTIAL KNEE ARTHROPLASTY Right 12/28/2016   Procedure: RIGHT UNICOMPARTMENTAL KNEE;  Surgeon: Renette Butters, MD;  Location: [place];  Service: Orthopedics;  Laterality: Right;   PARTIAL  KNEE ARTHROPLASTY Left 09/23/2018   Procedure: UNICOMPARTMENTAL LEFT  KNEE;  Surgeon: Renette Butters, MD;  Location: [facility];  Service: Orthopedics;  Laterality: Left;   TOTAL ABDOMINAL HYSTERECTOMY  1997   UPPER GI ENDOSCOPY      Current Outpatient Medications  Medication Sig Dispense Refill   acetaminophen (TYLENOL) 650 MG CR tablet Take 650 mg by mouth at bedtime.     amLODipine (NORVASC) 5 MG tablet Take 5 mg by mouth daily.      apixaban (ELIQUIS) 5 MG TABS tablet TAKE 1 TABLET BY MOUTH TWICE A DAY 60 tablet 6   bimatoprost (LUMIGAN) 0.01 % SOLN Place 1 drop into both eyes at bedtime.     Biotin 10000 MCG TABS Take 1,000 mcg by mouth daily.     levothyroxine (SYNTHROID) 112 MCG tablet Take 112 mcg by mouth daily before breakfast.     meclizine (ANTIVERT) 25 MG tablet Take 25 mg by mouth 3 (three) times daily as needed for dizziness.     metoprolol tartrate (LOPRESSOR) 25 MG tablet Take 1 tablet (25 mg total) by mouth 2 (two) times daily. 180 tablet 3   Omeprazole (PRILOSEC PO) Take 40 mg by mouth 2 (two) times daily.     OVER THE COUNTER MEDICATION Take 1 capsule by mouth 2 (two) times daily. Hydro Eye Supplement     polyethylene glycol powder (GLYCOLAX/MIRALAX) powder Take 17 g by mouth daily as needed for mild constipation.      simvastatin (ZOCOR) 10 MG tablet TAKE 1 TABLET BY MOUTH EVERY DAY IN THE EVENING 90 tablet 1   sucralfate (CARAFATE) 1 g tablet Take 1 g by mouth daily as needed (Indigestion).     timolol (BETIMOL) 0.5 % ophthalmic solution Place 1 drop into both eyes 2 (two) times daily.      No current facility-administered medications for this encounter.    Allergies  Allergen Reactions   Alphagan [Brimonidine] Other (See Comments)    Dries eyes out.    Dorzolamide Hcl Other (See Comments)    Dried eyes out.   Penicillins Other (See Comments)    Has patient had a PCN reaction causing immediate rash, facial/tongue/throat swelling, SOB or lightheadedness with  hypotension: Unknown childhood reaction Has patient had a PCN reaction causing severe rash involving mucus membranes or skin necrosis: No Has patient had a PCN reaction that required hospitalization No Has patient had a PCN reaction occurring within the last 10 years: No If all of the above answers are "NO", then may proceed with Cephalosporin use.    Latex Rash    Social History   Socioeconomic History   Marital status: Widowed    Spouse name: Not on file   Number of children: Not on file   Years of education: Not on file   Highest education level: Not on file  Occupational History   Not on file  Tobacco Use  Smoking status: Never   Smokeless tobacco: Never   Tobacco comments:    tobacco use - no   Vaping Use   Vaping Use: Never used  Substance and Sexual Activity   Alcohol use: No    Alcohol/week: 0.0 standard drinks   Drug use: No   Sexual activity: Not on file  Other Topics Concern   Not on file  Social History Narrative   Widowed, works full time, gets regular exercise.    Social Determinants of Health   Financial Resource Strain: Not on file  Food Insecurity: Not on file  Transportation Needs: Not on file  Physical Activity: Not on file  Stress: Not on file  Social Connections: Not on file  Intimate Partner Violence: Not on file     ROS- All systems are reviewed and negative except as per the HPI above.  Physical Exam: Vitals:   10/19/21 1116  BP: 136/70  Pulse: 64  Weight: 96.6 kg  Height: 5\' 5"  (1.651 m)    GEN- The patient is a well appearing obese elderly female, alert and oriented x 3 today.   HEENT-head normocephalic, atraumatic, sclera clear, conjunctiva pink, hearing intact, trachea midline. Lungs- Clear to ausculation bilaterally, normal work of breathing Heart- Regular rate and rhythm, no murmurs, rubs or gallops  GI- soft, NT, ND, + BS Extremities- no clubbing, cyanosis, or edema MS- no significant deformity or atrophy Skin- no rash  or lesion Psych- euthymic mood, full affect Neuro- strength and sensation are intact   Wt Readings from Last 3 Encounters:  10/19/21 96.6 kg  09/21/21 97.5 kg  08/23/21 98.2 kg    EKG today demonstrates  SR Vent. rate 64 BPM PR interval 130 ms QRS duration 92 ms QT/QTcB 418/431 ms  Echo 05/04/20 demonstrated   1. Left ventricular ejection fraction, by estimation, is 60 to 65%. The  left ventricle has normal function. The left ventricle has no regional  wall motion abnormalities. Left ventricular diastolic parameters are  indeterminate.   2. Right ventricular systolic function is normal. The right ventricular  size is normal. Tricuspid regurgitation signal is inadequate for assessing  PA pressure.   3. The mitral valve is grossly normal. Trivial mitral valve  regurgitation.   4. The aortic valve is tricuspid. Aortic valve regurgitation is trivial.   5. The inferior vena cava is normal in size with greater than 50%  respiratory variability, suggesting right atrial pressure of 3 mmHg.   Epic records are reviewed at length today  CHA2DS2-VASc Score = 5  The patient's score is based upon: CHF History: 0 HTN History: 1 Diabetes History: 0 Stroke History: 0 Vascular Disease History: 1 (aortic atherosclerosis) Age Score: 2 Gender Score: 1     ASSESSMENT AND PLAN: 1. Paroxysmal Atrial Fibrillation (ICD10:  I48.0) The patient's CHA2DS2-VASc score is 5, indicating a 7.2% annual risk of stroke.   S/p afib ablation 09/21/21 Patient appears to be maintaining SR. Continue Eliquis 5 mg BID with no missed doses for 3 months post ablation.  Continue Lopressor 25 mg BID, may take an extra 12.5 mg for heart racing.  2. Secondary Hypercoagulable State (ICD10:  D68.69) The patient is at significant risk for stroke/thromboembolism based upon her CHA2DS2-VASc Score of 5.  Continue Apixaban (Eliquis).   3. Obesity Body mass index is 35.45 kg/m. Lifestyle modification was discussed and  encouraged including regular physical activity and weight reduction.  4. OSA Waiting for CPAP to arrive.  Followed by Dr Radford Pax.  5.  HTN Stable, no changes today.   Follow up with Dr Rayann Heman and Dr Domenic Polite as scheduled.    [facility] [address] [place], [place] [address] [phone] 10/19/2021 11:32 AM

## 2021-11-06 ENCOUNTER — Telehealth (HOSPITAL_COMMUNITY): Payer: Self-pay | Admitting: *Deleted

## 2021-11-06 MED ORDER — FUROSEMIDE 20 MG PO TABS: ORAL_TABLET | ORAL | 0 refills | Status: DC

## 2021-11-06 NOTE — Telephone Encounter (Signed)
Patient called in stating developed shortness of breath and abdominal bloating over the weekend. Pt recently started CPAP was worried related but symptoms are during day. Per Adline Peals PA will order lasix 20mg  daily for the next 3 days then as needed for weight gain. Pt verbalized understanding and will call in tomorrow with update.

## 2021-11-07 ENCOUNTER — Encounter: Payer: Self-pay | Admitting: Internal Medicine

## 2021-11-08 NOTE — Telephone Encounter (Signed)
Spoke with the patient who reports that she has been using her CPAP for a little over a week and had been doing fine. Over the weekend however she woke up in the middle of the night gasping for air and ripped the machine off. She states that the next day she was completely exhausted. She states that she tried to use her machine again the next night but once again was gasping for air. She was again fatigued the next day. She has not used the machine since Sunday night. She states that she has not had any trouble at night since and has felt much better during the day. She states that she is scared to use her machine again.  She reports that she has followed all instructions from [place].  She does report that since she had her CPAP titration on 10/16 and then on 10/20 she had an ablation. She states that she has remained in NSR rhythm other than one minor episode of A fib since her ablation.   Patient is in [place] (see report below):  Compliance Report Usage 10/06/2021 - 11/04/2021 Usage days 11/30 days (37%) >= 4 hours 10 days (33%) < 4 hours 1 days (3%) Usage hours 98 hours 25 minutes Average usage (total days) 3 hours 17 minutes Average usage (days used) 8 hours 57 minutes Median usage (days used) 9 hours 34 minutes Total used hours (value since last reset - 11/04/2021) 98 hours AirSense 11 AutoSet Serial number [id number] Mode CPAP Set pressure 6 cmH2O EPR Fulltime EPR level 3 Therapy Leaks - L/min Median: 0.0 95th percentile: 9.6 Maximum: 57.7 Events per hour AI: 2.4 HI: 0.1 AHI: 2.5 Apnea Index Central: 0.4 Obstructive: 1.9 Unknown: 0.1 RERA Index 0.1 Cheyne-Stokes respiration (average duration per night) 0 minutes (0%)

## 2021-11-13 ENCOUNTER — Other Ambulatory Visit (HOSPITAL_COMMUNITY): Payer: Self-pay | Admitting: Physician Assistant

## 2021-11-15 ENCOUNTER — Telehealth: Payer: Self-pay | Admitting: *Deleted

## 2021-11-15 ENCOUNTER — Encounter: Payer: Self-pay | Admitting: *Deleted

## 2021-11-15 DIAGNOSIS — G4733 Obstructive sleep apnea (adult) (pediatric): Secondary | ICD-10-CM

## 2021-11-15 NOTE — Telephone Encounter (Signed)
This encounter was created in error - please disregard.

## 2021-11-15 NOTE — Telephone Encounter (Signed)
change her from set CPAP pressure to auto CPAP from 4 to 15cm H2O and get a download in 2 weeks

## 2021-11-15 NOTE — Telephone Encounter (Signed)
Order faxed to [facility] 

## 2021-12-13 ENCOUNTER — Other Ambulatory Visit: Payer: Self-pay | Admitting: *Deleted

## 2021-12-13 MED ORDER — SIMVASTATIN 10 MG PO TABS: 10.0000 mg | ORAL_TABLET | Freq: Every evening | ORAL | 1 refills | Status: DC

## 2021-12-14 ENCOUNTER — Other Ambulatory Visit: Payer: Self-pay | Admitting: *Deleted

## 2021-12-14 MED ORDER — APIXABAN 5 MG PO TABS: 5.0000 mg | ORAL_TABLET | Freq: Two times a day (BID) | ORAL | 5 refills | Status: DC

## 2021-12-14 NOTE — Telephone Encounter (Signed)
Prescription refill request for Eliquis received. Indication: PAF Last office visit: 10/19/21  C Fenton PA Scr: 0.84 on 09/01/21 Age: 76 Weight: 96.6kg  Based on above findings Eliquis 5mg  twice daily is the appropriate dose.  Refill approved.

## 2022-01-05 ENCOUNTER — Ambulatory Visit (INDEPENDENT_AMBULATORY_CARE_PROVIDER_SITE_OTHER): Payer: Medicare Other | Admitting: Internal Medicine

## 2022-01-05 ENCOUNTER — Encounter: Payer: Self-pay | Admitting: Internal Medicine

## 2022-01-05 ENCOUNTER — Ambulatory Visit: Payer: Medicare Other | Admitting: Internal Medicine

## 2022-01-05 ENCOUNTER — Other Ambulatory Visit: Payer: Self-pay

## 2022-01-05 VITALS — BP 126/62 | HR 60 | Ht 65.0 in | Wt 220.0 lb

## 2022-01-05 DIAGNOSIS — D6869 Other thrombophilia: Secondary | ICD-10-CM | POA: Diagnosis not present

## 2022-01-05 DIAGNOSIS — I480 Paroxysmal atrial fibrillation: Secondary | ICD-10-CM | POA: Diagnosis not present

## 2022-01-05 DIAGNOSIS — I10 Essential (primary) hypertension: Secondary | ICD-10-CM

## 2022-01-05 DIAGNOSIS — G4733 Obstructive sleep apnea (adult) (pediatric): Secondary | ICD-10-CM

## 2022-01-05 MED ORDER — METOPROLOL TARTRATE 25 MG PO TABS: 12.5000 mg | ORAL_TABLET | Freq: Two times a day (BID) | ORAL | Status: DC

## 2022-01-05 NOTE — Patient Instructions (Signed)
Medication Instructions:  Decrease Metoprolol to 12.5mg  twice a day x 2 months, then STOP. Continue all other medications.     Labwork: none  Testing/Procedures: none  Follow-Up: 6 months   Any Other Special Instructions Will Be Listed Below (If Applicable).   If you need a refill on your cardiac medications before your next appointment, please call your pharmacy.

## 2022-01-05 NOTE — Progress Notes (Signed)
PCP: Galen Manila, MD Primary Cardiologist: Dr Ilene Qua is a 76 y.o. female who presents today for routine electrophysiology followup.  Since his recent afib ablation, the patient reports doing very well.  she denies procedure related complications and is pleased with the results of the procedure.  Today, she denies symptoms of palpitations, chest pain, shortness of breath,  lower extremity edema, dizziness, presyncope, or syncope.  The patient is otherwise without complaint today.   Past Medical History:  Diagnosis Date   ACE-inhibitor cough    Aortic atherosclerosis (HCC)    Arthritis    Closed right ankle fracture 2016   Collagen vascular disease (HCC)    GERD (gastroesophageal reflux disease)    Glaucoma    History of breast cancer 2005   Left   Hypothyroidism    Mixed hyperlipidemia    Paroxysmal atrial fibrillation [facility])    Pelvic prolapse    Personal history of chemotherapy 2005   Personal history of radiation therapy 2005   Takotsubo cardiomyopathy 2008   cardiac cath 11/08. minimal coronary disease, anteroseptal akinesis with an EF 55%.  echo 2/09 EF 60-65% (LV function normalized)   Wears glasses    Past Surgical History:  Procedure Laterality Date   ANTERIOR AND POSTERIOR REPAIR WITH SACROSPINOUS FIXATION N/A 08/20/2019   Procedure: ANTERIOR AND POSTERIOR REPAIR WITH possible SACROSPINOUS FIXATION;  Surgeon: Everlene Farrier, MD;  Location: [place];  Service: Gynecology;  Laterality: N/A;  need bed   ATRIAL FIBRILLATION ABLATION N/A 09/21/2021   Procedure: ATRIAL FIBRILLATION ABLATION;  Surgeon: Thompson Grayer, MD;  Location: [place] [facility];  Service: Cardiovascular;  Laterality: N/A;   BACK SURGERY     BREAST BIOPSY  01/03/2012   BREAST LUMPECTOMY Left 2005   left breast   BUNIONECTOMY     left   CHOLECYSTECTOMY     COLONOSCOPY     GANGLION CYST EXCISION     left   PARTIAL KNEE ARTHROPLASTY Right 12/28/2016    Procedure: RIGHT UNICOMPARTMENTAL KNEE;  Surgeon: Renette Butters, MD;  Location: [place];  Service: Orthopedics;  Laterality: Right;   PARTIAL KNEE ARTHROPLASTY Left 09/23/2018   Procedure: UNICOMPARTMENTAL LEFT  KNEE;  Surgeon: Renette Butters, MD;  Location: [facility];  Service: Orthopedics;  Laterality: Left;   TOTAL ABDOMINAL HYSTERECTOMY  1997   UPPER GI ENDOSCOPY      ROS- all systems are personally reviewed and negatives except as per HPI above  Current Outpatient Medications  Medication Sig Dispense Refill   acetaminophen (TYLENOL) 650 MG CR tablet Take 650 mg by mouth at bedtime.     amLODipine (NORVASC) 5 MG tablet Take 5 mg by mouth daily.      apixaban (ELIQUIS) 5 MG TABS tablet Take 1 tablet (5 mg total) by mouth 2 (two) times daily. 60 tablet 5   bimatoprost (LUMIGAN) 0.01 % SOLN Place 1 drop into both eyes at bedtime.     Biotin 10000 MCG TABS Take 1,000 mcg by mouth daily.     furosemide (LASIX) 20 MG tablet TAKE 1 TABLET BY MOUTH DAILY FOR THE NEXT 3 DAYS THEN ONLY AS NEEDED FOR WEIGHT GAIN/SWELLING 30 tablet 2   levothyroxine (SYNTHROID) 112 MCG tablet Take 112 mcg by mouth daily before breakfast.     meclizine (ANTIVERT) 25 MG tablet Take 25 mg by mouth 3 (three) times daily as needed for dizziness.     Omeprazole (PRILOSEC PO) Take 40  mg by mouth 2 (two) times daily.     OVER THE COUNTER MEDICATION Take 1 capsule by mouth 2 (two) times daily. Hydro Eye Supplement     polyethylene glycol powder (GLYCOLAX/MIRALAX) powder Take 17 g by mouth daily as needed for mild constipation.      simvastatin (ZOCOR) 10 MG tablet Take 1 tablet (10 mg total) by mouth every evening. 90 tablet 1   sucralfate (CARAFATE) 1 g tablet Take 1 g by mouth daily as needed (Indigestion).     timolol (BETIMOL) 0.5 % ophthalmic solution Place 1 drop into both eyes 2 (two) times daily.      metoprolol tartrate (LOPRESSOR) 25 MG tablet Take 1 tablet (25 mg total) by mouth 2 (two) times  daily. 180 tablet 3   No current facility-administered medications for this visit.    Physical Exam: Vitals:   01/05/22 1408  BP: 126/62  Pulse: 60  SpO2: 99%  Weight: 220 lb (99.8 kg)  Height: 5\' 5"  (1.651 m)    GEN- The patient is well appearing, alert and oriented x 3 today.   Head- normocephalic, atraumatic Eyes-  Sclera clear, conjunctiva pink Ears- hearing intact Oropharynx- clear Lungs- Clear to ausculation bilaterally, normal work of breathing Heart- Regular rate and rhythm, no murmurs, rubs or gallops, PMI not laterally displaced GI- soft, NT, ND, + BS Extremities- no clubbing, cyanosis, or edema  EKG tracing ordered today is personally reviewed and shows sinus rhythm  Assessment and Plan:  1. Paroxysmal atrial fibrillation Doing well s/p ablation chads2vasc score is 5.  She is on eliquis Reduce metoprolol to 12.5mg  BID x 2 months then discontinue  2. HTN Stable No change required today  3. Obesity Body mass index is 36.61 kg/m. Lifestyle modification advised  4. OSA Using CPAP We discussed at length today.  She is not finding use to be very comfortable but is willing to continue  Return to see me in 6 months  Thompson Grayer MD, [facility] 01/05/2022 2:27 PM

## 2022-01-14 NOTE — Progress Notes (Signed)
Virtual Visit via Video Note   This visit type was conducted due to national recommendations for restrictions regarding the COVID-19 Pandemic (e.g. social distancing) in an effort to limit this patient's exposure and mitigate transmission in our community.  Due to her co-morbid illnesses, this patient is at least at moderate risk for complications without adequate follow up.  This format is felt to be most appropriate for this patient at this time.  All issues noted in this document were discussed and addressed.  A limited physical exam was performed with this format.  Please refer to the patient's chart for her consent to telehealth for [facility].  Date:  01/15/2022   ID:  Julie Jefferson, DOB [birth year], MRN [id number] The patient was identified using 2 identifiers.  Patient Location: Home Provider Location: Home Office   PCP:  Galen Manila, MD   [facility] Providers Cardiologist:  Rozann Lesches, MD Sleep Medicine:  Fransico Him, MD     Evaluation Performed:  Follow-Up Visit  Chief Complaint:  OSA  History of Present Illness:    Julie Jefferson is a 76 y.o. female with a history of GERD, hyperlipidemia, PAF, Takotsubo cardiomyopathy was referred for PSG.  She underwent PSG showing mild obstructive sleep apnea with an AHI of 7.8/h with no significant central events.  She did have mild oxygen desaturations as low as 82% but did not qualify as nocturnal hypoxemia.  She underwent CPAP titration to 6 cm H2O but did not tolerate a set pressure and was change to auto PAP and is now here for follow-up.  She is doing well with her CPAP device and thinks that she has gotten used to it.  She tolerates the mask and feels the pressure is adequate.  Since going on CPAP she feels rested in the am and has no significant daytime sleepiness.  She denies any significant mouth or nasal dryness or nasal congestion.  She does not think that she snores.     The patient does not have  symptoms concerning for COVID-19 infection (fever, chills, cough, or new shortness of breath).    Past Medical History:  Diagnosis Date   ACE-inhibitor cough    Aortic atherosclerosis (HCC)    Arthritis    Closed right ankle fracture 2016   Collagen vascular disease (HCC)    GERD (gastroesophageal reflux disease)    Glaucoma    History of breast cancer 2005   Left   Hypothyroidism    Mixed hyperlipidemia    OSA (obstructive sleep apnea)    mild with AHI 7.8/hr on auto CPAP   Paroxysmal atrial fibrillation (HCC)    Pelvic prolapse    Personal history of chemotherapy 2005   Personal history of radiation therapy 2005   Takotsubo cardiomyopathy 2008   cardiac cath 11/08. minimal coronary disease, anteroseptal akinesis with an EF 55%.  echo 2/09 EF 60-65% (LV function normalized)   Wears glasses    Past Surgical History:  Procedure Laterality Date   ANTERIOR AND POSTERIOR REPAIR WITH SACROSPINOUS FIXATION N/A 08/20/2019   Procedure: ANTERIOR AND POSTERIOR REPAIR WITH possible SACROSPINOUS FIXATION;  Surgeon: Everlene Farrier, MD;  Location: [place];  Service: Gynecology;  Laterality: N/A;  need bed   ATRIAL FIBRILLATION ABLATION N/A 09/21/2021   Procedure: ATRIAL FIBRILLATION ABLATION;  Surgeon: Thompson Grayer, MD;  Location: [place] [facility];  Service: Cardiovascular;  Laterality: N/A;   BACK SURGERY     BREAST BIOPSY  01/03/2012  BREAST LUMPECTOMY Left 2005   left breast   BUNIONECTOMY     left   CHOLECYSTECTOMY     COLONOSCOPY     GANGLION CYST EXCISION     left   PARTIAL KNEE ARTHROPLASTY Right 12/28/2016   Procedure: RIGHT UNICOMPARTMENTAL KNEE;  Surgeon: Renette Butters, MD;  Location: [place];  Service: Orthopedics;  Laterality: Right;   PARTIAL KNEE ARTHROPLASTY Left 09/23/2018   Procedure: UNICOMPARTMENTAL LEFT  KNEE;  Surgeon: Renette Butters, MD;  [facility];  Service: Orthopedics;  Laterality: Left;   TOTAL ABDOMINAL  HYSTERECTOMY  1997   UPPER GI ENDOSCOPY       Current Meds  Medication Sig   acetaminophen (TYLENOL) 650 MG CR tablet Take 650 mg by mouth at bedtime.   amLODipine (NORVASC) 5 MG tablet Take 5 mg by mouth daily.    apixaban (ELIQUIS) 5 MG TABS tablet Take 1 tablet (5 mg total) by mouth 2 (two) times daily.   bimatoprost (LUMIGAN) 0.01 % SOLN Place 1 drop into both eyes at bedtime.   Biotin 10000 MCG TABS Take 1,000 mcg by mouth daily.   furosemide (LASIX) 20 MG tablet TAKE 1 TABLET BY MOUTH DAILY FOR THE NEXT 3 DAYS THEN ONLY AS NEEDED FOR WEIGHT GAIN/SWELLING   levothyroxine (SYNTHROID) 112 MCG tablet Take 112 mcg by mouth daily before breakfast.   meclizine (ANTIVERT) 25 MG tablet Take 25 mg by mouth 3 (three) times daily as needed for dizziness.   metoprolol tartrate (LOPRESSOR) 25 MG tablet Take 0.5 tablets (12.5 mg total) by mouth 2 (two) times daily.   Omeprazole (PRILOSEC PO) Take 40 mg by mouth 2 (two) times daily.   OVER THE COUNTER MEDICATION Take 1 capsule by mouth 2 (two) times daily. Hydro Eye Supplement   polyethylene glycol powder (GLYCOLAX/MIRALAX) powder Take 17 g by mouth daily as needed for mild constipation.    simvastatin (ZOCOR) 10 MG tablet Take 1 tablet (10 mg total) by mouth every evening.   sucralfate (CARAFATE) 1 g tablet Take 1 g by mouth daily as needed (Indigestion).   timolol (BETIMOL) 0.5 % ophthalmic solution Place 1 drop into both eyes 2 (two) times daily.      Allergies:   Alphagan [brimonidine], Dorzolamide hcl, Penicillins, and Latex   Social History   Tobacco Use   Smoking status: Never   Smokeless tobacco: Never   Tobacco comments:    tobacco use - no   Vaping Use   Vaping Use: Never used  Substance Use Topics   Alcohol use: No    Alcohol/week: 0.0 standard drinks   Drug use: No     Family Hx: The patient's family history includes Breast cancer in her maternal aunt, mother, and sister; Cancer in her sister; Cancer (age of onset: 85) in  her maternal aunt and mother; Coronary artery disease in an other family member; Heart disease in her father.  ROS:   Please see the history of present illness.     All other systems reviewed and are negative.   Prior CV studies:   The following studies were reviewed today:  PSG, CPAP titration, PAP compliance download  Labs/Other Tests and Data Reviewed:    EKG:  No ECG reviewed.  Recent Labs: 09/01/2021: BUN 19; Creatinine, Ser 0.84; Hemoglobin 11.7; Platelets 354; Potassium 4.3; Sodium 140   Recent Lipid Panel Lab Results  Component Value Date/Time   CHOL  10/25/2007 12:58 AM    161  ATP III CLASSIFICATION:  <200     mg/dL   Desirable  200-239  mg/dL   Borderline High  >=240    mg/dL   High   TRIG 53 10/25/2007 12:58 AM   HDL 70 10/25/2007 12:58 AM   CHOLHDL 2.3 10/25/2007 12:58 AM   LDLCALC  10/25/2007 12:58 AM    80        Total Cholesterol/HDL:CHD Risk Coronary Heart Disease Risk Table                     Men   Women  1/2 Average Risk   3.4   3.3    Wt Readings from Last 3 Encounters:  01/15/22 215 lb (97.5 kg)  01/05/22 220 lb (99.8 kg)  10/19/21 213 lb (96.6 kg)     Risk Assessment/Calculations:    CHA2DS2-VASc Score = 5 This indicates a 7.2% annual risk of stroke. The patient's score is based upon: CHF History: 0 HTN History: 1 Diabetes History: 0 Stroke History: 0 Vascular Disease History: 1 (aortic atherosclerosis) Age Score: 2 Gender Score: 1      Objective:    Vital Signs:  Pulse 64    Ht 5\' 5"  (1.651 m)    Wt 215 lb (97.5 kg)    BMI 35.78 kg/m    VITAL SIGNS:  reviewed GEN:  no acute distress EYES:  sclerae anicteric, EOMI - Extraocular Movements Intact RESPIRATORY:  normal respiratory effort, symmetric expansion CARDIOVASCULAR:  no peripheral edema SKIN:  no rash, lesions or ulcers. MUSCULOSKELETAL:  no obvious deformities. NEURO:  alert and oriented x 3, no obvious focal deficit PSYCH:  normal affect  ASSESSMENT & PLAN:     OSA - The patient is tolerating PAP therapy well without any problems. The PAP download performed by his DME was personally reviewed and interpreted by me today and showed an AHI of 2.6/hr on auto PAP with 90% compliance in using more than 4 hours nightly.  The patient has been using and benefiting from PAP use and will continue to benefit from therapy.   Hypertension -BP is controlled at home -Continue prescription drug management with metoprolol tartrate 12.5 mg twice daily and amlodipine 5 mg daily with as needed refills     COVID-19 Education: The signs and symptoms of COVID-19 were discussed with the patient and how to seek care for testing (follow up with PCP or arrange E-visit).  The importance of social distancing was discussed today.  Time:   Today, I have spent 15 minutes with the patient with telehealth technology discussing the above problems.     Medication Adjustments/Labs and Tests Ordered: Current medicines are reviewed at length with the patient today.  Concerns regarding medicines are outlined above.   Tests Ordered: No orders of the defined types were placed in this encounter.   Medication Changes: No orders of the defined types were placed in this encounter.   Follow Up:  In Person in 1 year(s)  Signed, Fransico Him, MD  01/15/2022 8:32 AM    [place]

## 2022-01-15 ENCOUNTER — Encounter: Payer: Self-pay | Admitting: Cardiology

## 2022-01-15 ENCOUNTER — Other Ambulatory Visit: Payer: Self-pay

## 2022-01-15 ENCOUNTER — Telehealth (INDEPENDENT_AMBULATORY_CARE_PROVIDER_SITE_OTHER): Payer: Medicare Other | Admitting: Cardiology

## 2022-01-15 VITALS — HR 64 | Ht 65.0 in | Wt 215.0 lb

## 2022-01-15 DIAGNOSIS — G4733 Obstructive sleep apnea (adult) (pediatric): Secondary | ICD-10-CM

## 2022-01-15 DIAGNOSIS — I10 Essential (primary) hypertension: Secondary | ICD-10-CM

## 2022-01-17 ENCOUNTER — Telehealth: Payer: Self-pay | Admitting: *Deleted

## 2022-01-17 NOTE — Telephone Encounter (Signed)
-----   Message from Antonieta Iba, RN sent at 01/15/2022  8:37 AM EST ----- Per Dr. Radford Pax: let DME know that they need to send her supplies so she can change out her cushion every 4-6 weeks and she has no filters Thanks!

## 2022-01-17 NOTE — Telephone Encounter (Signed)
Orders placed to [employer] [facility] via community message

## 2022-02-28 ENCOUNTER — Ambulatory Visit (INDEPENDENT_AMBULATORY_CARE_PROVIDER_SITE_OTHER): Payer: Medicare Other | Admitting: Cardiology

## 2022-02-28 ENCOUNTER — Encounter: Payer: Self-pay | Admitting: Cardiology

## 2022-02-28 VITALS — BP 144/80 | HR 69 | Ht 65.0 in | Wt 225.0 lb

## 2022-02-28 DIAGNOSIS — Z8679 Personal history of other diseases of the circulatory system: Secondary | ICD-10-CM | POA: Diagnosis not present

## 2022-02-28 DIAGNOSIS — R0602 Shortness of breath: Secondary | ICD-10-CM

## 2022-02-28 DIAGNOSIS — I480 Paroxysmal atrial fibrillation: Secondary | ICD-10-CM

## 2022-02-28 NOTE — Progress Notes (Signed)
? ? ?Cardiology Office Note ? ?Date: 02/28/2022  ? ?[name], DOB August 23, 1946, MRN [id number] ? ?PCP:  Galen Manila, MD  ?Cardiologist:  Rozann Lesches, MD ?Electrophysiologist:  None  ? ?Chief Complaint  ?Patient presents with  ? Cardiac follow-up  ? ? ?History of Present Illness: ?Julie Jefferson is a 76 y.o. female last seen in September 2022.  She is here today with her daughter for a follow-up visit. She is status post atrial fibrillation ablation with Dr. Rayann Heman in October 2022.  CHA2DS2-VASc score is 5.  She has continued on Eliquis with weaning of beta-blocker to some degree.  Overall doing much better in terms of dyspnea on exertion, still has a brief sense of palpitations at times.  No dizziness or syncope.  No chest pain.  She has been trying to lose some weight as well and gradually increase her activity. ? ?She has also had evaluation by Dr. Radford Pax for management of OSA. ? ?I went over her medications which are noted below.  We discussed continuing low-dose Lopressor for now. ? ?Past Medical History:  ?Diagnosis Date  ? ACE-inhibitor cough   ? Aortic atherosclerosis ([place])   ? Arthritis   ? Closed right ankle fracture 2016  ? Collagen vascular disease ([place])   ? GERD (gastroesophageal reflux disease)   ? Glaucoma   ? History of breast cancer 2005  ? Left  ? Hypothyroidism   ? Mixed hyperlipidemia   ? OSA (obstructive sleep apnea)   ? mild with AHI 7.8/hr on auto CPAP  ? Paroxysmal atrial fibrillation (HCC)   ? Pelvic prolapse   ? Personal history of chemotherapy 2005  ? Personal history of radiation therapy 2005  ? Takotsubo cardiomyopathy 2008  ? cardiac cath 11/08. minimal coronary disease, anteroseptal akinesis with an EF 55%.  echo 2/09 EF 60-65% (LV function normalized)  ? Wears glasses   ? ? ?Past Surgical History:  ?Procedure Laterality Date  ? ANTERIOR AND POSTERIOR REPAIR WITH SACROSPINOUS FIXATION N/A 08/20/2019  ? Procedure: ANTERIOR AND POSTERIOR REPAIR WITH possible SACROSPINOUS  FIXATION;  Surgeon: Everlene Farrier, MD;  [facility];  Service: Gynecology;  Laterality: N/A;  need bed  ? ATRIAL FIBRILLATION ABLATION N/A 09/21/2021  ? Procedure: ATRIAL FIBRILLATION ABLATION;  Surgeon: Thompson Grayer, MD;  Location: [place] [facility];  Service: Cardiovascular;  Laterality: N/A;  ? BACK SURGERY    ? BREAST BIOPSY  01/03/2012  ? BREAST LUMPECTOMY Left 2005  ? left breast  ? BUNIONECTOMY    ? left  ? CHOLECYSTECTOMY    ? COLONOSCOPY    ? GANGLION CYST EXCISION    ? left  ? PARTIAL KNEE ARTHROPLASTY Right 12/28/2016  ? Procedure: RIGHT UNICOMPARTMENTAL KNEE;  Surgeon: Renette Butters, MD;  Location: [place];  Service: Orthopedics;  Laterality: Right;  ? PARTIAL KNEE ARTHROPLASTY Left 09/23/2018  ? Procedure: UNICOMPARTMENTAL LEFT  KNEE;  Surgeon: Renette Butters, MD;  [facility];  Service: Orthopedics;  Laterality: Left;  ? TOTAL ABDOMINAL HYSTERECTOMY  1997  ? UPPER GI ENDOSCOPY    ? ? ?Current Outpatient Medications  ?Medication Sig Dispense Refill  ? acetaminophen (TYLENOL) 650 MG CR tablet Take 650 mg by mouth at bedtime.    ? amLODipine (NORVASC) 5 MG tablet Take 5 mg by mouth daily.     ? apixaban (ELIQUIS) 5 MG TABS tablet Take 1 tablet (5 mg total) by mouth 2 (two) times daily. 60 tablet  5  ? bimatoprost (LUMIGAN) 0.01 % SOLN Place 1 drop into both eyes at bedtime.    ? Biotin 10000 MCG TABS Take 1,000 mcg by mouth daily.    ? furosemide (LASIX) 20 MG tablet TAKE 1 TABLET BY MOUTH DAILY FOR THE NEXT 3 DAYS THEN ONLY AS NEEDED FOR WEIGHT GAIN/SWELLING 30 tablet 2  ? levothyroxine (SYNTHROID) 112 MCG tablet Take 112 mcg by mouth daily before breakfast.    ? meclizine (ANTIVERT) 25 MG tablet Take 25 mg by mouth 3 (three) times daily as needed for dizziness.    ? metoprolol tartrate (LOPRESSOR) 25 MG tablet Take 0.5 tablets (12.5 mg total) by mouth 2 (two) times daily.    ? Omeprazole (PRILOSEC PO) Take 40 mg by mouth 2 (two) times daily.    ?  OVER THE COUNTER MEDICATION Take 1 capsule by mouth 2 (two) times daily. Hydro Eye Supplement    ? polyethylene glycol powder (GLYCOLAX/MIRALAX) powder Take 17 g by mouth daily as needed for mild constipation.     ? simvastatin (ZOCOR) 10 MG tablet Take 1 tablet (10 mg total) by mouth every evening. 90 tablet 1  ? sucralfate (CARAFATE) 1 g tablet Take 1 g by mouth daily as needed (Indigestion).    ? timolol (BETIMOL) 0.5 % ophthalmic solution Place 1 drop into both eyes 2 (two) times daily.     ? ?No current facility-administered medications for this visit.  ? ?Allergies:  Alphagan [brimonidine], Dorzolamide hcl, Penicillins, and Latex  ? ?ROS: No orthopnea or PND. ? ?Physical Exam: ?VS:  BP (!) 144/80   Pulse 69   Ht '5\' 5"'$  (1.651 m)   Wt 225 lb (102.1 kg)   SpO2 97%   BMI 37.44 kg/m? , BMI Body mass index is 37.44 kg/m?. ? ?Wt Readings from Last 3 Encounters:  ?02/28/22 225 lb (102.1 kg)  ?01/15/22 215 lb (97.5 kg)  ?01/05/22 220 lb (99.8 kg)  ?  ?General: Patient appears comfortable at rest. ?HEENT: Conjunctiva and lids normal. ?Neck: Supple, no elevated JVP or carotid bruits, no thyromegaly. ?Lungs: Clear to auscultation, nonlabored breathing at rest. ?Cardiac: Regular rate and rhythm, no S3 or significant systolic murmur, no pericardial rub. ?Extremities: No pitting edema. ? ?ECG:  An ECG dated 01/05/2022 was personally reviewed today and demonstrated:  Sinus rhythm with low voltage. ? ?Recent Labwork: ?09/01/2021: BUN 19; Creatinine, Ser 0.84; Hemoglobin 11.7; Platelets 354; Potassium 4.3; Sodium 140  ?December 2022: TSH 2.06 ? ?Other Studies Reviewed Today: ? ?Echocardiogram 05/04/2020: ? 1. Left ventricular ejection fraction, by estimation, is 60 to 65%. The  ?left ventricle has normal function. The left ventricle has no regional  ?wall motion abnormalities. Left ventricular diastolic parameters are  ?indeterminate.  ? 2. Right ventricular systolic function is normal. The right ventricular  ?size is normal.  Tricuspid regurgitation signal is inadequate for assessing  ?PA pressure.  ? 3. The mitral valve is grossly normal. Trivial mitral valve  ?regurgitation.  ? 4. The aortic valve is tricuspid. Aortic valve regurgitation is trivial.  ? 5. The inferior vena cava is normal in size with greater than 50%  ?respiratory variability, suggesting right atrial pressure of 3 mmHg.  ? ?Assessment and Plan: ? ?1.  Paroxysmal atrial fibrillation with CHA2DS2-VASc score of 5.  She is status post successful atrial fibrillation ablation with Dr. Rayann Heman.  Heart rate regular today.  Plan to continue Eliquis for stroke prophylaxis, also remain on low-dose Lopressor for now in light of intermittent sense  of brief palpitations.  Shortness of breath overall is better but not resolved. With prior history of Takotsubo cardiomyopathy we will also obtain a follow-up echocardiogram to ensure normal LVEF compared to 2021. ? ?2.  Mixed hyperlipidemia, she remains on Zocor with follow-up by PCP. ? ?Medication Adjustments/Labs and Tests Ordered: ?Current medicines are reviewed at length with the patient today.  Concerns regarding medicines are outlined above.  ? ?Tests Ordered: ?Orders Placed This Encounter  ?Procedures  ? ECHOCARDIOGRAM COMPLETE  ? ? ?Medication Changes: ?No orders of the defined types were placed in this encounter. ? ? ?Disposition:  Follow up  6 months. ? ?Signed, ?Satira Sark, MD, [facility] ?02/28/2022 4:16 PM    ?[place] [facility] ?[place], [place], [place] [address] ?Phone: [phone]; Fax: [phone]  ?

## 2022-02-28 NOTE — Patient Instructions (Addendum)

## 2022-03-25 ENCOUNTER — Other Ambulatory Visit: Payer: Self-pay | Admitting: Cardiology

## 2022-03-27 ENCOUNTER — Ambulatory Visit (INDEPENDENT_AMBULATORY_CARE_PROVIDER_SITE_OTHER): Payer: Medicare Other

## 2022-03-27 DIAGNOSIS — R0602 Shortness of breath: Secondary | ICD-10-CM | POA: Diagnosis not present

## 2022-03-27 LAB — ECHOCARDIOGRAM COMPLETE
AR max vel: 3.19 cm2
AV Area VTI: 3.54 cm2
AV Area mean vel: 2.97 cm2
AV Mean grad: 3 mmHg
AV Peak grad: 5.9 mmHg
Ao pk vel: 1.21 m/s
Area-P 1/2: 3.24 cm2
Calc EF: 69.1 %
S' Lateral: 2.16 cm
Single Plane A2C EF: 66.9 %
Single Plane A4C EF: 71.1 %

## 2022-06-22 ENCOUNTER — Other Ambulatory Visit: Payer: Self-pay | Admitting: Cardiology

## 2022-06-22 NOTE — Telephone Encounter (Signed)
Prescription refill request for Eliquis received. Indication:Afib Last office visit:3/23 Scr:0.8 Age: 76 Weight:102.1 kg  Prescription refilled

## 2022-07-06 ENCOUNTER — Encounter: Payer: Self-pay | Admitting: Internal Medicine

## 2022-07-06 ENCOUNTER — Ambulatory Visit (INDEPENDENT_AMBULATORY_CARE_PROVIDER_SITE_OTHER): Payer: Medicare Other | Admitting: Internal Medicine

## 2022-07-06 VITALS — BP 138/70 | HR 64 | Ht 65.0 in | Wt 222.6 lb

## 2022-07-06 DIAGNOSIS — I10 Essential (primary) hypertension: Secondary | ICD-10-CM

## 2022-07-06 DIAGNOSIS — D6869 Other thrombophilia: Secondary | ICD-10-CM

## 2022-07-06 DIAGNOSIS — E782 Mixed hyperlipidemia: Secondary | ICD-10-CM

## 2022-07-06 DIAGNOSIS — Z79899 Other long term (current) drug therapy: Secondary | ICD-10-CM | POA: Diagnosis not present

## 2022-07-06 MED ORDER — METOPROLOL TARTRATE 25 MG PO TABS: 12.5000 mg | ORAL_TABLET | Freq: Two times a day (BID) | ORAL | Status: DC

## 2022-07-06 NOTE — Patient Instructions (Signed)
Medication Instructions:  Continue all current medications.  Labwork: FLP, CMET, CBC - orders given today  Reminder:  Nothing to eat or drink after 12 midnight prior to labs. Office will contact with results via phone, letter or mychart.     Testing/Procedures: none  Follow-Up: Your physician wants you to follow up in:  1 year.  You should receive a call from the office when due.  If you don't receive this call, please call our office to schedule the follow up appointment    Any Other Special Instructions Will Be Listed Below (If Applicable).   If you need a refill on your cardiac medications before your next appointment, please call your pharmacy.

## 2022-07-06 NOTE — Progress Notes (Signed)
PCP: Galen Manila, MD   Primary EP: Dr Rayann Heman  Julie Jefferson is a 76 y.o. female who presents today for routine electrophysiology followup.  Since last being seen in our clinic, the patient reports doing very well.  Today, she denies symptoms of palpitations, chest pain,   lower extremity edema, dizziness, presyncope, or syncope.   She finds that she has some fatigue.  + SOB/ reduced exercise tolerance with moderate activity. The patient is otherwise without complaint today.   Past Medical History:  Diagnosis Date   ACE-inhibitor cough    Aortic atherosclerosis (HCC)    Arthritis    Closed right ankle fracture 2016   Collagen vascular disease (HCC)    GERD (gastroesophageal reflux disease)    Glaucoma    History of breast cancer 2005   Left   Hypothyroidism    Mixed hyperlipidemia    OSA (obstructive sleep apnea)    mild with AHI 7.8/hr on auto CPAP   Paroxysmal atrial fibrillation (HCC)    Pelvic prolapse    Personal history of chemotherapy 2005   Personal history of radiation therapy 2005   Takotsubo cardiomyopathy 2008   cardiac cath 11/08. minimal coronary disease, anteroseptal akinesis with an EF 55%.  echo 2/09 EF 60-65% (LV function normalized)   Wears glasses    Past Surgical History:  Procedure Laterality Date   ANTERIOR AND POSTERIOR REPAIR WITH SACROSPINOUS FIXATION N/A 08/20/2019   Procedure: ANTERIOR AND POSTERIOR REPAIR WITH possible SACROSPINOUS FIXATION;  Surgeon: Everlene Farrier, MD;  Location: [place];  Service: Gynecology;  Laterality: N/A;  need bed   ATRIAL FIBRILLATION ABLATION N/A 09/21/2021   Procedure: ATRIAL FIBRILLATION ABLATION;  Surgeon: Thompson Grayer, MD;  Location: [place] [facility];  Service: Cardiovascular;  Laterality: N/A;   BACK SURGERY     BREAST BIOPSY  01/03/2012   BREAST LUMPECTOMY Left 2005   left breast   BUNIONECTOMY     left   CHOLECYSTECTOMY     COLONOSCOPY     GANGLION CYST EXCISION     left    PARTIAL KNEE ARTHROPLASTY Right 12/28/2016   Procedure: RIGHT UNICOMPARTMENTAL KNEE;  Surgeon: Renette Butters, MD;  Location: [place];  Service: Orthopedics;  Laterality: Right;   PARTIAL KNEE ARTHROPLASTY Left 09/23/2018   Procedure: UNICOMPARTMENTAL LEFT  KNEE;  Surgeon: Renette Butters, MD;  [facility];  Service: Orthopedics;  Laterality: Left;   TOTAL ABDOMINAL HYSTERECTOMY  1997   UPPER GI ENDOSCOPY      ROS- all systems are reviewed and negatives except as per HPI above  Current Outpatient Medications  Medication Sig Dispense Refill   acetaminophen (TYLENOL) 650 MG CR tablet Take 650 mg by mouth at bedtime.     amLODipine (NORVASC) 5 MG tablet Take 5 mg by mouth daily.      Biotin 10000 MCG TABS Take 1,000 mcg by mouth daily.     ELIQUIS 5 MG TABS tablet TAKE 1 TABLET BY MOUTH TWICE A DAY 60 tablet 5   levothyroxine (SYNTHROID) 112 MCG tablet Take 112 mcg by mouth daily before breakfast.     meclizine (ANTIVERT) 25 MG tablet Take 25 mg by mouth 3 (three) times daily as needed for dizziness.     metoprolol tartrate (LOPRESSOR) 25 MG tablet TAKE 1 TABLET BY MOUTH TWICE A DAY (Patient taking differently: Take 12.5 mg by mouth 2 (two) times daily.) 180 tablet 3   Omeprazole (PRILOSEC PO) Take 40  mg by mouth 2 (two) times daily.     OVER THE COUNTER MEDICATION Take 1 capsule by mouth 2 (two) times daily. Hydro Eye Supplement     polyethylene glycol powder (GLYCOLAX/MIRALAX) powder Take 17 g by mouth daily as needed for mild constipation.      simvastatin (ZOCOR) 10 MG tablet TAKE 1 TABLET BY MOUTH EVERY DAY IN THE EVENING 90 tablet 1   sucralfate (CARAFATE) 1 g tablet Take 1 g by mouth daily as needed (Indigestion).     timolol (BETIMOL) 0.5 % ophthalmic solution Place 1 drop into both eyes 2 (two) times daily.      No current facility-administered medications for this visit.    Physical Exam: Vitals:   07/06/22 1333  Weight: 222 lb 9.6 oz (101 kg)   Height: '5\' 5"'$  (1.651 m)    GEN- The patient is well appearing, alert and oriented x 3 today.   Head- normocephalic, atraumatic Eyes-  Sclera clear, conjunctiva pink Ears- hearing intact Oropharynx- clear Lungs- Clear to ausculation bilaterally, normal work of breathing Heart- Regular rate and rhythm, no murmurs, rubs or gallops, PMI not laterally displaced GI- soft, NT, ND, + BS Extremities- no clubbing, cyanosis, or edema  Wt Readings from Last 3 Encounters:  07/06/22 222 lb 9.6 oz (101 kg)  02/28/22 225 lb (102.1 kg)  01/15/22 215 lb (97.5 kg)   Echo 03/27/22- EF 70%, mild MR, mod PI    Assessment and Plan:  Paroxysmal atrial fibrillation Doing well post ablation Chads2vasc score is 5.  She is on eliquis Labs 9/22 reviewed Order bmet, cbc Given fatigue and decreased exercise tolerance, I have advised that she can try to come off of her metoprolol.  If symptoms resolve/ improve then she can stay off of this long term.  2. HTN Stable No change required today Bmet ordered  3. Obesity Body mass index is 37.04 kg/m. Lifestyle modification is advised I do think that her fatigue and reduced exercise tolerance would improve with regular exercise and weight loss  4. OSA Usess CPAP  5. HL Order fasting lipids and lfts  Risks, benefits and potential toxicities for medications prescribed and/or refilled reviewed with patient today.   Return in a year Follow-up with Dr Domenic Polite as scheduled  Thompson Grayer MD, [facility] 07/06/2022 1:38 PM

## 2022-07-11 ENCOUNTER — Other Ambulatory Visit: Payer: Self-pay | Admitting: Cardiology

## 2022-07-12 LAB — COMPREHENSIVE METABOLIC PANEL
ALT: 19 IU/L (ref 0–32)
AST: 22 IU/L (ref 0–40)
Albumin/Globulin Ratio: 1.9 (ref 1.2–2.2)
Albumin: 4.9 g/dL — ABNORMAL HIGH (ref 3.8–4.8)
Alkaline Phosphatase: 71 IU/L (ref 44–121)
BUN/Creatinine Ratio: 19 (ref 12–28)
BUN: 17 mg/dL (ref 8–27)
Bilirubin Total: 0.2 mg/dL (ref 0.0–1.2)
CO2: 21 mmol/L (ref 20–29)
Calcium: 9.8 mg/dL (ref 8.7–10.3)
Chloride: 100 mmol/L (ref 96–106)
Creatinine, Ser: 0.88 mg/dL (ref 0.57–1.00)
Globulin, Total: 2.6 g/dL (ref 1.5–4.5)
Glucose: 94 mg/dL (ref 70–99)
Potassium: 4.4 mmol/L (ref 3.5–5.2)
Sodium: 138 mmol/L (ref 134–144)
Total Protein: 7.5 g/dL (ref 6.0–8.5)
eGFR: 68 mL/min/{1.73_m2} (ref >59)

## 2022-07-12 LAB — LIPID PANEL W/O CHOL/HDL RATIO
Cholesterol, Total: 188 mg/dL (ref 100–199)
HDL: 60 mg/dL (ref >39)
LDL Chol Calc (NIH): 97 mg/dL (ref 0–99)
Triglycerides: 184 mg/dL — ABNORMAL HIGH (ref 0–149)
VLDL Cholesterol Cal: 31 mg/dL (ref 5–40)

## 2022-07-12 LAB — CBC/DIFF AMBIGUOUS DEFAULT
Basophils Absolute: 0.1 10*3/uL (ref 0.0–0.2)
Basos: 2 %
EOS (ABSOLUTE): 0.2 10*3/uL (ref 0.0–0.4)
Eos: 3 %
Hematocrit: 33.3 % — ABNORMAL LOW (ref 34.0–46.6)
Hemoglobin: 10.2 g/dL — ABNORMAL LOW (ref 11.1–15.9)
Immature Grans (Abs): 0 10*3/uL (ref 0.0–0.1)
Immature Granulocytes: 0 %
Lymphocytes Absolute: 2 10*3/uL (ref 0.7–3.1)
Lymphs: 37 %
MCH: 22.7 pg — ABNORMAL LOW (ref 26.6–33.0)
MCHC: 30.6 g/dL — ABNORMAL LOW (ref 31.5–35.7)
MCV: 74 fL — ABNORMAL LOW (ref 79–97)
Monocytes Absolute: 0.4 10*3/uL (ref 0.1–0.9)
Monocytes: 6 %
Neutrophils Absolute: 2.9 10*3/uL (ref 1.4–7.0)
Neutrophils: 52 %
Platelets: 394 10*3/uL (ref 150–450)
RBC: 4.5 x10E6/uL (ref 3.77–5.28)
RDW: 16.2 % — ABNORMAL HIGH (ref 11.7–15.4)
WBC: 5.5 10*3/uL (ref 3.4–10.8)

## 2022-07-12 LAB — SPECIMEN STATUS REPORT

## 2022-07-20 ENCOUNTER — Encounter: Payer: Self-pay | Admitting: Internal Medicine

## 2022-07-20 DIAGNOSIS — E782 Mixed hyperlipidemia: Secondary | ICD-10-CM

## 2022-07-20 DIAGNOSIS — Z79899 Other long term (current) drug therapy: Secondary | ICD-10-CM

## 2022-07-20 MED ORDER — SIMVASTATIN 20 MG PO TABS: 20.0000 mg | ORAL_TABLET | Freq: Every day | ORAL | 2 refills | Status: DC

## 2022-07-20 NOTE — Telephone Encounter (Signed)
-----   Message from Erma Heritage, [place] sent at 07/20/2022 12:52 PM EDT ----- Covering for Dr. Domenic Polite - For unclear reasons, her labs were obtained over 9 days ago but just now resulted to [place]. Her kidney function, liver function and electrolytes are within a normal range. Platelet count is within a normal range but her hemoglobin is low at 10.2. Would recommend follow-up labs with her PCP for further evaluation of anemia as she would likely need an iron panel, folate and B12. Want to make sure this is addressed given the use of Eliquis. Her total cholesterol is at 188 but LDL is at 97 and we prefer for this to be less than 70 given prior coronary calcifications. Would recommend titration of Simvastatin to 20 mg daily and rechecking an FLP in 2 months. If remains above goal, may need to switch to Crestor.

## 2022-07-20 NOTE — Telephone Encounter (Signed)
Patient informed and verbalized understanding of plan. Copy sent to PCP Lab order faxed to [employer] [facility] [facility]

## 2022-07-20 NOTE — Telephone Encounter (Signed)
-----   Message from Erma Heritage, [place] sent at 07/20/2022 12:53 PM EDT ----- Covering for Dr. Domenic Polite - For unclear reasons, her labs were obtained over 9 days ago but just now resulted to [place]. Her kidney function, liver function and electrolytes are within a normal range. Platelet count is within a normal range but her hemoglobin is low at 10.2. Would recommend follow-up labs with her PCP for further evaluation of anemia as she would likely need an iron panel, folate and B12. Want to make sure this is addressed given the use of Eliquis. Her total cholesterol is at 188 but LDL is at 97 and we prefer for this to be less than 70 given prior coronary calcifications. Would recommend titration of Simvastatin to 20 mg daily and rechecking an FLP in 2 months. If remains above goal, may need to switch to Crestor.

## 2022-08-21 ENCOUNTER — Telehealth: Payer: Self-pay | Admitting: Cardiology

## 2022-08-21 NOTE — Telephone Encounter (Signed)
   Pre-operative Risk Assessment    Patient Name: Julie Jefferson  DOB: 1946-01-10 MRN: [id number]      Request for Surgical Clearance    Procedure:   Colonoscopy   Date of Surgery:  Clearance 09/17/22                                 Surgeon:  Dr. Irine Seal Group or Practice Name:  [facility] Phone number:  [phone] Fax number:  [phone]   Type of Clearance Requested:   - Medical  - Pharmacy:  Hold Apixaban (Eliquis)     Type of Anesthesia:   Propofol   Additional requests/questions:  Please advise surgeon/provider what medications should be held.  Signed, Desma Paganini   08/21/2022, 8:28 AM

## 2022-08-22 NOTE — Telephone Encounter (Signed)
Patient with diagnosis of atrial fibrillation on Eliquis for anticoagulation.    Procedure: Colonoscopy Date of procedure: 09/17/22   CHA2DS2-VASc Score = 4   This indicates a 4.8% annual risk of stroke. The patient's score is based upon: CHF History: 0 HTN History: 1 Diabetes History: 0 Stroke History: 0 Vascular Disease History: 0 Age Score: 2 Gender Score: 1   CrCl 86 Platelet count 394   Per office protocol, patient can hold Eliquis for 2 days prior to procedure.   Patient will not need bridging with Lovenox (enoxaparin) around procedure.  **This guidance is not considered finalized until pre-operative APP has relayed final recommendations.**

## 2022-08-22 NOTE — Telephone Encounter (Addendum)
    Patient Name: Julie Jefferson  DOB: 1946/09/02 MRN: [id number]  Primary Cardiologist: Rozann Lesches, MD  Chart reviewed as part of pre-operative protocol coverage.   The patient already has an upcoming appointment scheduled 09/12/22 w/ Dr. Domenic Polite at which time this clearance can be addressed in case there are any issues noted that would impact candidacy to hold anticoagulation. (Procedure date of 09/17/22 falls after appointment.). I added "preop" comment to appointment notes so that provider is aware to address at time of OV. Per office protocol, the provider seeing this patient should forward their finalized clearance decision to requesting party below.  - Will route to pharm so anticoag recs will be available for cardiology provider to reference at time of pre-op clearance OV.   - Will fax update to requesting surgeon so they are aware. Will remove from preop box as separate preop APP input not necessary at this time.  Charlie Pitter, PA-C 08/22/2022, 11:20 AM

## 2022-09-12 ENCOUNTER — Ambulatory Visit: Payer: Medicare Other | Attending: Cardiology | Admitting: Cardiology

## 2022-09-12 ENCOUNTER — Encounter: Payer: Self-pay | Admitting: Cardiology

## 2022-09-12 VITALS — BP 114/68 | HR 59 | Ht 66.0 in | Wt 221.8 lb

## 2022-09-12 DIAGNOSIS — E782 Mixed hyperlipidemia: Secondary | ICD-10-CM | POA: Insufficient documentation

## 2022-09-12 DIAGNOSIS — I480 Paroxysmal atrial fibrillation: Secondary | ICD-10-CM | POA: Insufficient documentation

## 2022-09-12 NOTE — Progress Notes (Signed)
Cardiology Office Note  Date: 09/12/2022   ID: Delyla, Sandeen 01-Nov-1946, MRN [id number]  PCP:  Galen Manila, MD  Cardiologist:  Rozann Lesches, MD Electrophysiologist:  Thompson Grayer, MD   Chief Complaint  Patient presents with   Cardiac follow-up    History of Present Illness: Julie Jefferson is a 76 y.o. female last seen in March.  She is here for a routine visit.  Continues to do very well without obvious breakthrough atrial fibrillation, only a rare sense of brief palpitations.  She did try coming off Lopressor completely, but states that ultimately she felt better staying on low-dose.  Functional status is at baseline, NYHA class II dyspnea.  She is scheduled for colonoscopy on October 16 with request to hold Eliquis, 48-hour hold recommended.  We discussed this today.  I reviewed the remainder of her medications as outlined below.  Past Medical History:  Diagnosis Date   ACE-inhibitor cough    Aortic atherosclerosis (HCC)    Arthritis    Closed right ankle fracture 2016   Collagen vascular disease (HCC)    GERD (gastroesophageal reflux disease)    Glaucoma    History of breast cancer 2005   Left   Hypothyroidism    Mixed hyperlipidemia    OSA (obstructive sleep apnea)    mild with AHI 7.8/hr on auto CPAP   Paroxysmal atrial fibrillation (HCC)    Pelvic prolapse    Personal history of chemotherapy 2005   Personal history of radiation therapy 2005   Takotsubo cardiomyopathy 2008   cardiac cath 11/08. minimal coronary disease, anteroseptal akinesis with an EF 55%.  echo 2/09 EF 60-65% (LV function normalized)   Wears glasses     Past Surgical History:  Procedure Laterality Date   ANTERIOR AND POSTERIOR REPAIR WITH SACROSPINOUS FIXATION N/A 08/20/2019   Procedure: ANTERIOR AND POSTERIOR REPAIR WITH possible SACROSPINOUS FIXATION;  Surgeon: Everlene Farrier, MD;  Location: [facility];  Service: Gynecology;  Laterality: N/A;  need bed    ATRIAL FIBRILLATION ABLATION N/A 09/21/2021   Procedure: ATRIAL FIBRILLATION ABLATION;  Surgeon: Thompson Grayer, MD;  Location: [facility];  Service: Cardiovascular;  Laterality: N/A;   BACK SURGERY     BREAST BIOPSY  01/03/2012   BREAST LUMPECTOMY Left 2005   left breast   BUNIONECTOMY     left   CHOLECYSTECTOMY     COLONOSCOPY     GANGLION CYST EXCISION     left   PARTIAL KNEE ARTHROPLASTY Right 12/28/2016   Procedure: RIGHT UNICOMPARTMENTAL KNEE;  Surgeon: Renette Butters, MD;  Location: [facility];  Service: Orthopedics;  Laterality: Right;   PARTIAL KNEE ARTHROPLASTY Left 09/23/2018   Procedure: UNICOMPARTMENTAL LEFT  KNEE;  Surgeon: Renette Butters, MD;  Location: [facility];  Service: Orthopedics;  Laterality: Left;   TOTAL ABDOMINAL HYSTERECTOMY  1997   UPPER GI ENDOSCOPY      Current Outpatient Medications  Medication Sig Dispense Refill   acetaminophen (TYLENOL) 650 MG CR tablet Take 650 mg by mouth at bedtime.     amLODipine (NORVASC) 5 MG tablet Take 5 mg by mouth daily.      Biotin 10000 MCG TABS Take 1,000 mcg by mouth daily.     ELIQUIS 5 MG TABS tablet TAKE 1 TABLET BY MOUTH TWICE A DAY 60 tablet 5   Iron-Vitamin C (VITRON-C) 65-125 MG TABS Take 1 tablet by mouth daily.     levothyroxine (SYNTHROID) 112  MCG tablet Take 112 mcg by mouth daily before breakfast.     meclizine (ANTIVERT) 25 MG tablet Take 25 mg by mouth 3 (three) times daily as needed for dizziness.     metoprolol tartrate (LOPRESSOR) 25 MG tablet Take 0.5 tablets (12.5 mg total) by mouth 2 (two) times daily.     Omeprazole (PRILOSEC PO) Take 40 mg by mouth 2 (two) times daily.     OVER THE COUNTER MEDICATION Take 1 capsule by mouth 2 (two) times daily. Hydro Eye Supplement     polyethylene glycol powder (GLYCOLAX/MIRALAX) powder Take 17 g by mouth daily as needed for mild constipation.      simvastatin (ZOCOR) 20 MG tablet Take 1 tablet (20 mg total) by mouth at bedtime. 90 tablet 2    sucralfate (CARAFATE) 1 g tablet Take 1 g by mouth daily as needed (Indigestion).     timolol (BETIMOL) 0.5 % ophthalmic solution Place 1 drop into both eyes 2 (two) times daily.      No current facility-administered medications for this visit.   Allergies:  Alphagan [brimonidine], Dorzolamide hcl, Penicillins, and Latex   ROS:  No syncope.  Physical Exam: VS:  BP 114/68   Pulse (!) 59   Ht '5\' 6"'$  (1.676 m)   Wt 221 lb 12.8 oz (100.6 kg)   SpO2 100%   BMI 35.80 kg/m , BMI Body mass index is 35.8 kg/m.  Wt Readings from Last 3 Encounters:  09/12/22 221 lb 12.8 oz (100.6 kg)  07/06/22 222 lb 9.6 oz (101 kg)  02/28/22 225 lb (102.1 kg)    General: Patient appears comfortable at rest. HEENT: Conjunctiva and lids normal. Neck: Supple, no elevated JVP or carotid bruits. Lungs: Clear to auscultation, nonlabored breathing at rest. Cardiac: Regular rate and rhythm, no S3 or significant systolic murmur. Extremities: No pitting edema.  ECG:  An ECG dated 01/05/2022 was personally reviewed today and demonstrated:  Sinus rhythm.  Recent Labwork: 07/11/2022: ALT 19; AST 22; BUN 17; Creatinine, Ser 0.88; Hemoglobin 10.2; Platelets 394; Potassium 4.4; Sodium 138     Component Value Date/Time   CHOL 188 07/11/2022 0848   TRIG 184 (H) 07/11/2022 0848   HDL 60 07/11/2022 0848   CHOLHDL 2.3 10/25/2007 0058   VLDL 11 10/25/2007 0058   LDLCALC 97 07/11/2022 0848    Other Studies Reviewed Today:  Echocardiogram 03/27/2022:  1. Left ventricular ejection fraction, by estimation, is 70 to 75%. The  left ventricle has hyperdynamic function. The left ventricle has no  regional wall motion abnormalities. Left ventricular diastolic parameters  are indeterminate. The average left  ventricular global longitudinal strain is -20.2 %. The global longitudinal  strain is normal.   2. Right ventricular systolic function is normal. The right ventricular  size is normal. Tricuspid regurgitation signal is  inadequate for assessing  PA pressure.   3. The mitral valve is normal in structure. Mild mitral valve  regurgitation. No evidence of mitral stenosis.   4. The aortic valve is tricuspid. Aortic valve regurgitation is not  visualized. No aortic stenosis is present.   5. Pulmonic valve regurgitation is moderate.   6. The inferior vena cava is normal in size with greater than 50%  respiratory variability, suggesting right atrial pressure of 3 mmHg.   Assessment and Plan:  1.  Paroxysmal atrial fibrillation with CHA2DS2-VASc score of 5.  She is doing very well status post atrial fibrillation ablation, only a rare sense of brief palpitations at times, but  feels better maintaining low-dose Lopressor without any major functional limitation.  Continue Eliquis for stroke prophylaxis.  I reviewed her lab work from August.  She does not report any spontaneous bleeding problems.  2.  Mixed hyperlipidemia, on Zocor with follow-up by PCP.  3.  Plan for elective colonoscopy and EGD, recommending 48-hour hold on Eliquis prior to the procedure.  Medication Adjustments/Labs and Tests Ordered: Current medicines are reviewed at length with the patient today.  Concerns regarding medicines are outlined above.   Tests Ordered: No orders of the defined types were placed in this encounter.   Medication Changes: No orders of the defined types were placed in this encounter.   Disposition:  Follow up  6 months.  Signed, Satira Sark, MD, [facility] 09/12/2022 11:50 AM    [facility] [place], [place], [place] [address] Phone: [phone]; Fax: [phone]

## 2022-09-12 NOTE — Patient Instructions (Addendum)

## 2022-09-24 ENCOUNTER — Other Ambulatory Visit: Payer: Self-pay | Admitting: Gastroenterology

## 2022-10-05 ENCOUNTER — Other Ambulatory Visit: Payer: Self-pay | Admitting: *Deleted

## 2022-10-05 MED ORDER — SIMVASTATIN 20 MG PO TABS: 20.0000 mg | ORAL_TABLET | Freq: Every day | ORAL | 2 refills | Status: DC

## 2022-10-09 ENCOUNTER — Other Ambulatory Visit: Payer: Self-pay | Admitting: *Deleted

## 2022-11-12 ENCOUNTER — Other Ambulatory Visit: Payer: Self-pay | Admitting: Cardiology

## 2022-12-29 ENCOUNTER — Other Ambulatory Visit: Payer: Self-pay | Admitting: Cardiology

## 2022-12-31 NOTE — Telephone Encounter (Signed)
Prescription refill request for Eliquis received. Indication: PAF Last office visit: 09/12/22  S McDowel MD Scr: 0.85 on 10/23/22 Age: 77 Weight: 100.6kg  Based on above findings Eliquis '5mg'$  twice daily is the appropriate dose.  Refill approved.

## 2023-01-08 ENCOUNTER — Encounter: Payer: Self-pay | Admitting: Cardiology

## 2023-01-08 ENCOUNTER — Encounter (HOSPITAL_COMMUNITY): Payer: Self-pay | Admitting: Gastroenterology

## 2023-01-17 NOTE — Anesthesia Preprocedure Evaluation (Addendum)
Anesthesia Evaluation  Patient identified by MRN, date of birth, ID band Patient awake    Reviewed: Allergy & Precautions, NPO status , Patient's Chart, lab work & pertinent test results  Airway Mallampati: II  TM Distance: >3 FB Neck ROM: Full    Dental no notable dental hx.    Pulmonary sleep apnea and Continuous Positive Airway Pressure Ventilation    Pulmonary exam normal        Cardiovascular hypertension, Pt. on medications and Pt. on home beta blockers + Past MI  + dysrhythmias Atrial Fibrillation  Rhythm:Regular Rate:Normal    1. Left ventricular ejection fraction, by estimation, is 70 to 75%. The  left ventricle has hyperdynamic function. The left ventricle has no  regional wall motion abnormalities. Left ventricular diastolic parameters  are indeterminate. The average left  ventricular global longitudinal strain is -20.2 %. The global longitudinal  strain is normal.   2. Right ventricular systolic function is normal. The right ventricular  size is normal. Tricuspid regurgitation signal is inadequate for assessing  PA pressure.   3. The mitral valve is normal in structure. Mild mitral valve  regurgitation. No evidence of mitral stenosis.   4. The aortic valve is tricuspid. Aortic valve regurgitation is not  visualized. No aortic stenosis is present.   5. Pulmonic valve regurgitation is moderate.   6. The inferior vena cava is normal in size with greater than 50%  respiratory variability, suggesting right atrial pressure of 3 mmHg.   Comparison(s): LVEF 60-65%.     Neuro/Psych negative neurological ROS  negative psych ROS   GI/Hepatic Neg liver ROS,GERD  Medicated,,Esophageal AVM   Endo/Other  Hypothyroidism    Renal/GU negative Renal ROS  negative genitourinary   Musculoskeletal  (+) Arthritis , Osteoarthritis,    Abdominal Normal abdominal exam  (+)   Peds  Hematology negative hematology ROS (+)    Anesthesia Other Findings   Reproductive/Obstetrics                             Anesthesia Physical Anesthesia Plan  ASA: 3  Anesthesia Plan: MAC   Post-op Pain Management:    Induction: Intravenous  PONV Risk Score and Plan: 2 and Propofol infusion and Treatment may vary due to age or medical condition  Airway Management Planned: Simple Face Mask, Natural Airway and Nasal Cannula  Additional Equipment: None  Intra-op Plan:   Post-operative Plan:   Informed Consent: I have reviewed the patients History and Physical, chart, labs and discussed the procedure including the risks, benefits and alternatives for the proposed anesthesia with the patient or authorized representative who has indicated his/her understanding and acceptance.     Dental advisory given  Plan Discussed with:   Anesthesia Plan Comments:        Anesthesia Quick Evaluation

## 2023-01-18 ENCOUNTER — Encounter (HOSPITAL_COMMUNITY): Payer: Self-pay | Admitting: Gastroenterology

## 2023-01-18 ENCOUNTER — Ambulatory Visit (HOSPITAL_BASED_OUTPATIENT_CLINIC_OR_DEPARTMENT_OTHER): Payer: Medicare HMO | Admitting: Certified Registered Nurse Anesthetist

## 2023-01-18 ENCOUNTER — Ambulatory Visit (HOSPITAL_COMMUNITY): Payer: Medicare HMO | Admitting: Certified Registered Nurse Anesthetist

## 2023-01-18 ENCOUNTER — Encounter (HOSPITAL_COMMUNITY)
Admission: RE | Disposition: A | Discharge status: Home / Self Care | Payer: Self-pay | Source: Home / Self Care | Attending: Gastroenterology

## 2023-01-18 ENCOUNTER — Other Ambulatory Visit: Payer: Self-pay

## 2023-01-18 ENCOUNTER — Ambulatory Visit (HOSPITAL_COMMUNITY)
Admission: RE | Admit: 2023-01-18 | Discharge: 2023-01-18 | Disposition: A | Discharge status: Home / Self Care | Payer: Medicare HMO | Attending: Gastroenterology | Admitting: Gastroenterology

## 2023-01-18 DIAGNOSIS — K449 Diaphragmatic hernia without obstruction or gangrene: Secondary | ICD-10-CM | POA: Diagnosis not present

## 2023-01-18 DIAGNOSIS — Z79899 Other long term (current) drug therapy: Secondary | ICD-10-CM | POA: Diagnosis not present

## 2023-01-18 DIAGNOSIS — I10 Essential (primary) hypertension: Secondary | ICD-10-CM

## 2023-01-18 DIAGNOSIS — K3189 Other diseases of stomach and duodenum: Secondary | ICD-10-CM | POA: Diagnosis not present

## 2023-01-18 DIAGNOSIS — I252 Old myocardial infarction: Secondary | ICD-10-CM | POA: Insufficient documentation

## 2023-01-18 DIAGNOSIS — K219 Gastro-esophageal reflux disease without esophagitis: Secondary | ICD-10-CM | POA: Diagnosis not present

## 2023-01-18 DIAGNOSIS — K31819 Angiodysplasia of stomach and duodenum without bleeding: Secondary | ICD-10-CM | POA: Diagnosis not present

## 2023-01-18 DIAGNOSIS — G4733 Obstructive sleep apnea (adult) (pediatric): Secondary | ICD-10-CM | POA: Insufficient documentation

## 2023-01-18 DIAGNOSIS — K317 Polyp of stomach and duodenum: Secondary | ICD-10-CM

## 2023-01-18 HISTORY — DX: Cardiac arrhythmia, unspecified: I49.9

## 2023-01-18 HISTORY — PX: HOT HEMOSTASIS: SHX5433

## 2023-01-18 HISTORY — DX: Malignant (primary) neoplasm, unspecified: C80.1

## 2023-01-18 HISTORY — PX: POLYPECTOMY: SHX5525

## 2023-01-18 HISTORY — PX: ESOPHAGOGASTRODUODENOSCOPY (EGD) WITH PROPOFOL: SHX5813

## 2023-01-18 HISTORY — DX: Acute myocardial infarction, unspecified: I21.9

## 2023-01-18 SURGERY — ESOPHAGOGASTRODUODENOSCOPY (EGD) WITH PROPOFOL
Anesthesia: Monitor Anesthesia Care

## 2023-01-18 MED ORDER — LIDOCAINE 2% (20 MG/ML) 5 ML SYRINGE
INTRAMUSCULAR | Status: DC | PRN
  Administered 2023-01-18: 100 mg via INTRAVENOUS

## 2023-01-18 MED ORDER — PROPOFOL 10 MG/ML IV BOLUS
INTRAVENOUS | Status: DC | PRN
  Administered 2023-01-18: 20 mg via INTRAVENOUS

## 2023-01-18 MED ORDER — PROPOFOL 1000 MG/100ML IV EMUL
INTRAVENOUS | Status: AC
  Filled 2023-01-18: qty 100

## 2023-01-18 MED ORDER — SODIUM CHLORIDE 0.9 % IV SOLN: INTRAVENOUS | Status: DC

## 2023-01-18 MED ORDER — LACTATED RINGERS IV SOLN: INTRAVENOUS | Status: DC

## 2023-01-18 MED ORDER — PROPOFOL 500 MG/50ML IV EMUL
INTRAVENOUS | Status: DC | PRN
  Administered 2023-01-18: 150 ug/kg/min via INTRAVENOUS

## 2023-01-18 MED ORDER — PROPOFOL 10 MG/ML IV BOLUS
INTRAVENOUS | Status: AC
  Filled 2023-01-18: qty 20

## 2023-01-18 SURGICAL SUPPLY — 15 items

## 2023-01-18 NOTE — Anesthesia Procedure Notes (Addendum)
Procedure Name: MAC Date/Time: 01/18/2023 8:06 AM  Performed by: West Pugh, CRNAPre-anesthesia Checklist: Patient identified, Emergency Drugs available, Suction available, Patient being monitored and Timeout performed Patient Re-evaluated:Patient Re-evaluated prior to induction Oxygen Delivery Method: Simple face mask Placement Confirmation: positive ETCO2 Dental Injury: Teeth and Oropharynx as per pre-operative assessment  Comments: Bite block easily placed, POM

## 2023-01-18 NOTE — H&P (Signed)
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  The procedure was performed for IDA and she is here today for further treatment.  Past Medical History:  Diagnosis Date   ACE-inhibitor cough    Aortic atherosclerosis (HCC)    Arthritis    Cancer ([place])    breat   Closed right ankle fracture 2016   Collagen vascular disease (HCC)    Dysrhythmia    GERD (gastroesophageal reflux disease)    Glaucoma    History of breast cancer 2005   Left   Hypertension    Hypothyroidism    Mixed hyperlipidemia    Myocardial infarction (HCC)    OSA (obstructive sleep apnea)    mild with AHI 7.8/hr on auto CPAP   Paroxysmal atrial fibrillation ([place])    Pelvic prolapse    Personal history of chemotherapy 2005   Personal history of radiation therapy 2005   Takotsubo cardiomyopathy 2008   cardiac cath 11/08. minimal coronary disease, anteroseptal akinesis with an EF 55%.  echo 2/09 EF 60-65% (LV function normalized)   Wears glasses     Past Surgical History:  Procedure Laterality Date   ANTERIOR AND POSTERIOR REPAIR WITH SACROSPINOUS FIXATION N/A 08/20/2019   Procedure: ANTERIOR AND POSTERIOR REPAIR WITH possible SACROSPINOUS FIXATION;  Surgeon: Everlene Farrier, MD;  Location: [facility];  Service: Gynecology;  Laterality: N/A;  need bed   ATRIAL FIBRILLATION ABLATION N/A 09/21/2021   Procedure: ATRIAL FIBRILLATION ABLATION;  Surgeon: Thompson Grayer, MD;  Location: [facility];  Service: Cardiovascular;  Laterality: N/A;   BACK SURGERY     BREAST BIOPSY  01/03/2012   BREAST LUMPECTOMY Left 2005   left breast   BUNIONECTOMY     left   CHOLECYSTECTOMY     COLONOSCOPY     GANGLION CYST EXCISION     left   PARTIAL KNEE ARTHROPLASTY Right 12/28/2016   Procedure: RIGHT UNICOMPARTMENTAL KNEE;  Surgeon: Renette Butters, MD;  Location: [facility];  Service: Orthopedics;  Laterality: Right;   PARTIAL KNEE ARTHROPLASTY Left  09/23/2018   Procedure: UNICOMPARTMENTAL LEFT  KNEE;  Surgeon: Renette Butters, MD;  [facility];  Service: Orthopedics;  Laterality: Left;   TOTAL ABDOMINAL HYSTERECTOMY  1997   UPPER GI ENDOSCOPY      Family History  Problem Relation Age of Onset   Cancer Mother 22       breast   Breast cancer Mother    Heart disease Father    Coronary artery disease Other        family hx   Cancer Sister        LCIS 5   Breast cancer Sister    Cancer Maternal Aunt 75       breast   Breast cancer Maternal Aunt     Social History:  reports that she has never smoked. She has never used smokeless tobacco. She reports that she does not drink alcohol and does not use drugs.  Allergies:  Allergies  Allergen Reactions   Alphagan [Brimonidine] Other (See Comments)    Dries eyes out.    Dorzolamide Hcl Other (See Comments)    Dried eyes out.   Penicillins Other (See Comments)    Has patient had a PCN reaction causing immediate rash, facial/tongue/throat swelling, SOB or lightheadedness with hypotension: Unknown childhood reaction Has patient had a PCN reaction causing severe rash involving mucus membranes or skin necrosis: No Has  patient had a PCN reaction that required hospitalization No Has patient had a PCN reaction occurring within the last 10 years: No If all of the above answers are "NO", then may proceed with Cephalosporin use.    Latex Rash    Medications: Scheduled: Continuous:  sodium chloride     lactated ringers      No results found for this or any previous visit (from the past 24 hour(s)).   No results found.  ROS:  As stated above in the HPI otherwise negative.  Height 5' 6"$  (1.676 m), weight 99.8 kg.    PE: Gen: NAD, Alert and Oriented HEENT:  [place]/AT, EOMI Neck: Supple, no LAD Lungs: CTA Bilaterally CV: RRR without M/G/R ABD: Soft, NTND, +BS Ext: No C/C/E  Assessment/Plan: 1) Gastric AVM - EGD with APC.  Julie Jefferson 01/18/2023, 7:15 AM

## 2023-01-18 NOTE — Transfer of Care (Signed)
Immediate Anesthesia Transfer of Care Note  Patient: Julie Jefferson  Procedure(s) Performed: ESOPHAGOGASTRODUODENOSCOPY (EGD) WITH PROPOFOL HOT HEMOSTASIS (ARGON PLASMA COAGULATION/BICAP) POLYPECTOMY  Patient Location: PACU and Endoscopy Unit  Anesthesia Type:MAC  Level of Consciousness: awake, alert , oriented, and patient cooperative  Airway & Oxygen Therapy: Patient Spontanous Breathing and Patient connected to nasal cannula oxygen  Post-op Assessment: Report given to RN and Post -op Vital signs reviewed and stable  Post vital signs: Reviewed and stable  Last Vitals:  Vitals Value Taken Time  BP    Temp    Pulse 59 01/18/23 0824  Resp 21 01/18/23 0824  SpO2 100 % 01/18/23 0824  Vitals shown include unvalidated device data.  Last Pain:  Vitals:   01/18/23 0718  TempSrc: Temporal  PainSc: 0-No pain         Complications: No notable events documented.

## 2023-01-18 NOTE — Anesthesia Postprocedure Evaluation (Signed)
Anesthesia Post Note  Patient: Julie Jefferson  Procedure(s) Performed: ESOPHAGOGASTRODUODENOSCOPY (EGD) WITH PROPOFOL HOT HEMOSTASIS (ARGON PLASMA COAGULATION/BICAP) POLYPECTOMY     Patient location during evaluation: PACU Anesthesia Type: MAC Level of consciousness: awake and alert Pain management: pain level controlled Vital Signs Assessment: post-procedure vital signs reviewed and stable Respiratory status: spontaneous breathing, nonlabored ventilation, respiratory function stable and patient connected to nasal cannula oxygen Cardiovascular status: stable and blood pressure returned to baseline Postop Assessment: no apparent nausea or vomiting Anesthetic complications: no   No notable events documented.  Last Vitals:  Vitals:   01/18/23 0830 01/18/23 0840  BP: (!) 143/46 135/78  Pulse: (!) 58 (!) 54  Resp: 17 16  Temp:    SpO2: 97% 100%    Last Pain:  Vitals:   01/18/23 0840  TempSrc:   PainSc: 0-No pain                 [name] Endiya Klahr

## 2023-01-18 NOTE — Op Note (Signed)
[facility] Patient Name: [facility] Procedure Date: 01/18/2023 MRN: [id number] Attending MD: Carol Ada , MD, [id number] Date of Birth: 08/09/1946 CSN: [id number] Age: 77 Admit Type: Outpatient Procedure:                Upper GI endoscopy Indications:              Therapeutic procedure Providers:                Carol Ada, MD, Carlyn Reichert, RN, William Dalton, Technician, Tyrone Apple, Technician,                            Christell Faith, CRNA Referring MD:              Medicines:                Propofol per Anesthesia Complications:            No immediate complications. Estimated Blood Loss:     Estimated blood loss: none. Procedure:                Pre-Anesthesia Assessment:                           - Prior to the procedure, a History and Physical                            was performed, and patient medications and                            allergies were reviewed. The patient's tolerance of                            previous anesthesia was also reviewed. The risks                            and benefits of the procedure and the sedation                            options and risks were discussed with the patient.                            All questions were answered, and informed consent                            was obtained. Prior Anticoagulants: The patient has                            taken Eliquis (apixaban), last dose was 5 days                            prior to procedure. ASA Grade Assessment: III - A  patient with severe systemic disease. After                            reviewing the risks and benefits, the patient was                            deemed in satisfactory condition to undergo the                            procedure.                           - Sedation was administered by an anesthesia                            professional. Deep sedation was attained.                            After obtaining informed consent, the endoscope was                            passed under direct vision. Throughout the                            procedure, the patient's blood pressure, pulse, and                            oxygen saturations were monitored continuously. The                            [id number] Olympus endoscope was introduced                            through the mouth, and advanced to the second part                            of duodenum. The upper GI endoscopy was                            accomplished without difficulty. The patient                            tolerated the procedure well. Scope In: Scope Out: Findings:      A 6 cm hiatal hernia was present.      The gastroesophageal flap valve was visualized endoscopically and       classified as Hill Grade IV (no fold, wide open lumen, hiatal hernia       present).      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 Coagulation for tissue destruction using monopolar probe       was successful.      A single 5 mm semi-pedunculated polyp with no bleeding and no stigmata       of recent bleeding was found in the gastric body. The polyp was removed  with a cold snare. Resection and retrieval were complete.      The examined duodenum was normal.      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 Two other smaller erythematous areas were noted and ablated       with APC. Impression:               - 6 cm hiatal hernia.                           - Gastroesophageal flap valve classified as Hill                            Grade IV (no fold, wide open lumen, hiatal hernia                            present).                           - A single non-bleeding angiodysplastic lesion in                            the stomach. Treated with a monopolar probe.                           - A single gastric polyp.  Resected and retrieved.                           - Normal examined duodenum. Moderate Sedation:      Not Applicable - Patient had care per Anesthesia. Recommendation:           - Patient has a contact number available for                            emergencies. The signs and symptoms of potential                            delayed complications were discussed with the                            patient. Return to normal activities tomorrow.                            Written discharge instructions were provided to the                            patient.                           - Resume previous diet.                           - Continue present medications.                           - Await pathology results.                           -  Return to GI clinic in 4 weeks.                           - Resume Eliquis today. Procedure Code(s):        --- Professional ---                           [phone], Esophagogastroduodenoscopy, flexible,                            transoral; with ablation of tumor(s), polyp(s), or                            other lesion(s) (includes pre- and post-dilation                            and guide wire passage, when performed)                           43251, 59, Esophagogastroduodenoscopy, flexible,                            transoral; with removal of tumor(s), polyp(s), or                            other lesion(s) by snare technique Diagnosis Code(s):        --- Professional ---                           [id number], Angiodysplasia of stomach and duodenum                            without bleeding                           K44.9, Diaphragmatic hernia without obstruction or                            gangrene                           K31.7, Polyp of stomach and duodenum CPT copyright 2022 American [employer] Association. All rights reserved. The codes documented in this report are preliminary and upon coder review may  be revised to meet current compliance  requirements. Carol Ada, MD Carol Ada, MD 01/18/2023 8:27:23 AM This report has been signed electronically. Number of Addenda: 0

## 2023-01-18 NOTE — Discharge Instructions (Addendum)
Restart eliquis today.  YOU HAD AN ENDOSCOPIC PROCEDURE TODAY: Refer to the procedure report and other information in the discharge instructions given to you for any specific questions about what was found during the examination. If this information does not answer your questions, please call [facility] at [phone] to clarify.   YOU SHOULD EXPECT: Some feelings of bloating in the abdomen. Passage of more gas than usual. Walking can help get rid of the air that was put into your GI tract during the procedure and reduce the bloating. If you had a lower endoscopy (such as a colonoscopy or flexible sigmoidoscopy) you may notice spotting of blood in your stool or on the toilet paper. Some abdominal soreness may be present for a day or two, also.  DIET: Your first meal following the procedure should be a light meal and then it is ok to progress to your normal diet. A half-sandwich or bowl of soup is an example of a good first meal. Heavy or fried foods are harder to digest and may make you feel nauseous or bloated. Drink plenty of fluids but you should avoid alcoholic beverages for 24 hours. If you had an esophageal dilation, please see attached information for diet.   ACTIVITY: Your care partner should take you home directly after the procedure. You should plan to take it easy, moving slowly for the rest of the day. You can resume normal activity the day after the procedure however YOU SHOULD NOT DRIVE, use power tools, machinery or perform tasks that involve climbing or major physical exertion for 24 hours (because of the sedation medicines used during the test).   SYMPTOMS TO REPORT IMMEDIATELY: A gastroenterologist can be reached at any hour. Please call [phone]  for any of the following symptoms:  Following lower endoscopy (colonoscopy, flexible sigmoidoscopy) Excessive amounts of blood in the stool  Significant tenderness, worsening of abdominal pains  Swelling of the abdomen that is  new, acute  Fever of 100 or higher  Following upper endoscopy (EGD, EUS, ERCP, esophageal dilation) Vomiting of blood or coffee ground material  New, significant abdominal pain  New, significant chest pain or pain under the shoulder blades  Painful or persistently difficult swallowing  New shortness of breath  Black, tarry-looking or red, bloody stools  FOLLOW UP:  If any biopsies were taken you will be contacted by phone or by letter within the next 1-3 weeks. Call [phone]  if you have not heard about the biopsies in 3 weeks.  Please also call with any specific questions about appointments or follow up tests.

## 2023-01-20 ENCOUNTER — Encounter (HOSPITAL_COMMUNITY): Payer: Self-pay | Admitting: Gastroenterology

## 2023-01-21 LAB — SURGICAL PATHOLOGY

## 2023-03-07 ENCOUNTER — Encounter (HOSPITAL_COMMUNITY): Payer: Self-pay

## 2023-03-07 ENCOUNTER — Telehealth: Payer: Self-pay | Admitting: Cardiology

## 2023-03-07 ENCOUNTER — Other Ambulatory Visit: Payer: Self-pay

## 2023-03-07 ENCOUNTER — Emergency Department (HOSPITAL_COMMUNITY)
Admission: EM | Admit: 2023-03-07 | Discharge: 2023-03-07 | Disposition: A | Discharge status: Home / Self Care | Payer: Medicare HMO | Attending: Emergency Medicine | Admitting: Emergency Medicine

## 2023-03-07 DIAGNOSIS — Z91040 Latex allergy status: Secondary | ICD-10-CM | POA: Insufficient documentation

## 2023-03-07 DIAGNOSIS — R531 Weakness: Secondary | ICD-10-CM | POA: Diagnosis present

## 2023-03-07 DIAGNOSIS — I4891 Unspecified atrial fibrillation: Secondary | ICD-10-CM

## 2023-03-07 DIAGNOSIS — Z79899 Other long term (current) drug therapy: Secondary | ICD-10-CM | POA: Insufficient documentation

## 2023-03-07 DIAGNOSIS — Z7901 Long term (current) use of anticoagulants: Secondary | ICD-10-CM | POA: Insufficient documentation

## 2023-03-07 LAB — CBC WITH DIFFERENTIAL/PLATELET
Abs Immature Granulocytes: 0.02 10*3/uL (ref 0.00–0.07)
Basophils Absolute: 0.1 10*3/uL (ref 0.0–0.1)
Basophils Relative: 1 %
Eosinophils Absolute: 0.1 10*3/uL (ref 0.0–0.5)
Eosinophils Relative: 1 %
HCT: 44.3 % (ref 36.0–46.0)
Hemoglobin: 13.5 g/dL (ref 12.0–15.0)
Immature Granulocytes: 0 %
Lymphocytes Relative: 40 %
Lymphs Abs: 3.1 10*3/uL (ref 0.7–4.0)
MCH: 26 pg (ref 26.0–34.0)
MCHC: 30.5 g/dL (ref 30.0–36.0)
MCV: 85.2 fL (ref 80.0–100.0)
Monocytes Absolute: 0.6 10*3/uL (ref 0.1–1.0)
Monocytes Relative: 8 %
Neutro Abs: 3.9 10*3/uL (ref 1.7–7.7)
Neutrophils Relative %: 50 %
Platelets: 368 10*3/uL (ref 150–400)
RBC: 5.2 MIL/uL — ABNORMAL HIGH (ref 3.87–5.11)
RDW: 15 % (ref 11.5–15.5)
WBC: 7.7 10*3/uL (ref 4.0–10.5)
nRBC: 0 % (ref 0.0–0.2)

## 2023-03-07 LAB — BASIC METABOLIC PANEL
Anion gap: 15 (ref 5–15)
BUN: 24 mg/dL — ABNORMAL HIGH (ref 8–23)
CO2: 20 mmol/L — ABNORMAL LOW (ref 22–32)
Calcium: 9.8 mg/dL (ref 8.9–10.3)
Chloride: 104 mmol/L (ref 98–111)
Creatinine, Ser: 0.89 mg/dL (ref 0.44–1.00)
GFR, Estimated: >60 mL/min (ref >60)
Glucose, Bld: 101 mg/dL — ABNORMAL HIGH (ref 70–99)
Potassium: 3.9 mmol/L (ref 3.5–5.1)
Sodium: 139 mmol/L (ref 135–145)

## 2023-03-07 LAB — TROPONIN I (HIGH SENSITIVITY): Troponin I (High Sensitivity): 19 ng/L — ABNORMAL HIGH (ref <18)

## 2023-03-07 LAB — MAGNESIUM: Magnesium: 2.2 mg/dL (ref 1.7–2.4)

## 2023-03-07 MED ORDER — PROPOFOL 10 MG/ML IV BOLUS
0.5000 mg/kg | Freq: Once | INTRAVENOUS | Status: AC
  Administered 2023-03-07: 49.5 mg via INTRAVENOUS
  Filled 2023-03-07: qty 20

## 2023-03-07 MED ORDER — DILTIAZEM HCL 25 MG/5ML IV SOLN
10.0000 mg | Freq: Once | INTRAVENOUS | Status: AC
  Administered 2023-03-07: 10 mg via INTRAVENOUS
  Filled 2023-03-07: qty 5

## 2023-03-07 MED ORDER — DILTIAZEM HCL-DEXTROSE 125-5 MG/125ML-% IV SOLN (PREMIX)
5.0000 mg/h | INTRAVENOUS | Status: DC
  Administered 2023-03-07: 5 mg/h via INTRAVENOUS
  Filled 2023-03-07: qty 125

## 2023-03-07 NOTE — ED Provider Notes (Signed)
[facility] Provider Note   CSN: [id number] Arrival date & time: 03/07/23  1555     History  Chief Complaint  Patient presents with   Atrial Fibrillation    [facility] is a 77 y.o. female.  77 year old female with prior medical history as detailed below presents for evaluation.  Patient with known history of A-fib status post ablation.  Patient is compliant with Eliquis as previously prescribed.  Patient reports onset of palpitations consistent with prior episodes of A-fib since this morning.  Patient took metoprolol at home without improvement in symptoms.  She complains of fast heart rate and generalized weakness associated with same.  She denies chest pain.  Last p.o. intake was around noon today.  The history is provided by the patient and medical records.       Home Medications Prior to Admission medications   Medication Sig Start Date End Date Taking? Authorizing Provider  acetaminophen (TYLENOL) 650 MG CR tablet Take 650 mg by mouth at bedtime.    [provider]  amLODipine (NORVASC) 5 MG tablet Take 5 mg by mouth daily.  10/03/11   [provider]  Biotin 10000 MCG TABS Take 10,000 mcg by mouth at bedtime.    [provider]  Dorzolamide HCl-Timolol Mal PF 2-0.5 % SOLN Apply 1 drop to eye 2 (two) times daily. 01/02/23   [provider]  ELIQUIS 5 MG TABS tablet TAKE 1 TABLET BY MOUTH TWICE A DAY 12/31/22   Satira Sark, MD  levothyroxine (SYNTHROID) 112 MCG tablet Take 112 mcg by mouth daily before breakfast.    [provider]  meclizine (ANTIVERT) 25 MG tablet Take 25 mg by mouth 3 (three) times daily as needed for dizziness.    [provider]  metoprolol tartrate (LOPRESSOR) 25 MG tablet Take 0.5 tablets (12.5 mg total) by mouth 2 (two) times daily. 07/06/22   Allred, Jeneen Rinks, MD  Multiple Vitamins-Minerals (MULTIVITAMIN WITH MINERALS) tablet Take 1 tablet by  mouth daily. Balance of nature    [provider]  Omeprazole (PRILOSEC PO) Take 40 mg by mouth 2 (two) times daily.    [provider]  OVER THE COUNTER MEDICATION Take 1 capsule by mouth 2 (two) times daily. [employer]	[place] Supplement    [provider]  Polyethyl Glyc-Propyl Glyc PF (SYSTANE HYDRATION PF) 0.4-0.3 % SOLN Place 1 drop into both eyes daily as needed (Irritation).    [provider]  polyethylene glycol powder (GLYCOLAX/MIRALAX) powder Take 17 g by mouth daily as needed for mild constipation.     [provider]  simvastatin (ZOCOR) 20 MG tablet Take 1 tablet (20 mg total) by mouth at bedtime. 10/05/22   Satira Sark, MD  sucralfate (CARAFATE) 1 g tablet Take 1 g by mouth daily as needed (Indigestion).    [provider]  Tafluprost, PF, (ZIOPTAN) 0.0015 % SOLN Place 1 drop into both eyes at bedtime.    [provider]  tiZANidine (ZANAFLEX) 2 MG tablet Take 2 mg by mouth 3 (three) times daily. 01/09/23   [provider]      Allergies    Alphagan [brimonidine], Dorzolamide hcl, Penicillins, and Latex    Review of Systems   Review of Systems  All other systems reviewed and are negative.   Physical Exam Updated Vital Signs BP 108/65   Pulse (!) 56   Temp 97.6 F (36.4 C) (Oral)   Resp 20  Ht 5\' 6"  (1.676 m)   Wt 98.9 kg   SpO2 100%   BMI 35.19 kg/m  Physical Exam Vitals and nursing note reviewed.  Constitutional:      General: She is not in acute distress.    Appearance: Normal appearance. She is well-developed.  HENT:     Head: Normocephalic and atraumatic.  Eyes:     Conjunctiva/sclera: Conjunctivae normal.     Pupils: Pupils are equal, round, and reactive to light.  Cardiovascular:     Rate and Rhythm: Tachycardia present. Rhythm irregular.     Heart sounds: Normal heart sounds.  Pulmonary:     Effort: Pulmonary effort is normal. No respiratory distress.     Breath sounds: Normal  breath sounds.  Abdominal:     General: There is no distension.     Palpations: Abdomen is soft.     Tenderness: There is no abdominal tenderness.  Musculoskeletal:        General: No deformity. Normal range of motion.     Cervical back: Normal range of motion and neck supple.  Skin:    General: Skin is warm and dry.  Neurological:     General: No focal deficit present.     Mental Status: She is alert and oriented to person, place, and time.     ED Results / Procedures / Treatments   Labs (all labs ordered are listed, but only abnormal results are displayed) Labs Reviewed  CBC WITH DIFFERENTIAL/PLATELET - Abnormal; Notable for the following components:      Result Value   RBC 5.20 (*)    All other components within normal limits  BASIC METABOLIC PANEL - Abnormal; Notable for the following components:   CO2 20 (*)    Glucose, Bld 101 (*)    BUN 24 (*)    All other components within normal limits  TROPONIN I (HIGH SENSITIVITY) - Abnormal; Notable for the following components:   Troponin I (High Sensitivity) 19 (*)    All other components within normal limits  MAGNESIUM  TROPONIN I (HIGH SENSITIVITY)    EKG EKG Interpretation  Date/Time:  Thursday March 07 2023 16:12:34 EDT Ventricular Rate:  128 PR Interval:    QRS Duration: 82 QT Interval:  322 QTC Calculation: 470 R Axis:   -15 Text Interpretation: Atrial flutter with 2:1 A-V conduction Septal infarct , age undetermined Abnormal ECG When compared with ECG of 19-Oct-2021 11:18, PREVIOUS ECG IS PRESENT Confirmed by Dene Gentry [phone]) on 03/07/2023 4:21:04 PM  Radiology No results found.  Procedures .Cardioversion  Date/Time: 03/07/2023 8:44 PM  Performed by: Valarie Merino, MD Authorized by: Valarie Merino, MD   Consent:    Consent obtained:  Verbal   Consent given by:  Patient   Risks discussed:  Cutaneous burn, death, induced arrhythmia and pain   Alternatives discussed:  No treatment,  anti-coagulation medication, delayed treatment, rate-control medication, alternative treatment, referral and observation Pre-procedure details:    Cardioversion basis:  Emergent   Rhythm:  Atrial fibrillation   Electrode placement:  Anterior-posterior Patient sedated: Yes. Refer to sedation procedure documentation for details of sedation.  Attempt one:    Cardioversion mode:  Synchronous   Waveform:  Biphasic   Shock (Joules):  200   Shock outcome:  Conversion to normal sinus rhythm Post-procedure details:    Patient status:  Awake   Patient tolerance of procedure:  Tolerated well, no immediate complications .Sedation  Date/Time: 03/07/2023 8:45 PM  Performed by: Dene Gentry  C, MD Authorized by: Valarie Merino, MD   Consent:    Consent obtained:  Verbal   Consent given by:  Patient   Risks discussed:  Allergic reaction, dysrhythmia, inadequate sedation, nausea, vomiting, respiratory compromise necessitating ventilatory assistance and intubation, prolonged hypoxia resulting in organ damage and prolonged sedation necessitating reversal   Alternatives discussed:  Analgesia without sedation Universal protocol:    Immediately prior to procedure, a time out was called: yes     Patient identity confirmed:  Anonymous protocol, patient vented/unresponsive, verbally with patient, arm band and hospital-assigned identification number Indications:    Procedure performed:  Cardioversion   Procedure necessitating sedation performed by:  Physician performing sedation Pre-sedation assessment:    Time since last food or drink:  7   ASA classification: class 1 - normal, healthy patient     Mouth opening:  3 or more finger widths   Thyromental distance:  4 finger widths   Mallampati score:  I - soft palate, uvula, fauces, pillars visible   Neck mobility: normal     Pre-sedation assessments completed and reviewed: airway patency, cardiovascular function, hydration status, mental status,  nausea/vomiting, pain level, respiratory function and temperature   Immediate pre-procedure details:    Reassessment: Patient reassessed immediately prior to procedure     Reviewed: vital signs, relevant labs/tests and NPO status     Verified: bag valve mask available, emergency equipment available, intubation equipment available, IV patency confirmed and oxygen available   Procedure details (see MAR for exact dosages):    Preoxygenation:  Nasal cannula   Sedation:  Propofol   Intended level of sedation: deep   Intra-procedure monitoring:  Blood pressure monitoring, cardiac monitor, continuous capnometry, continuous pulse oximetry, frequent LOC assessments and frequent vital sign checks   Intra-procedure events: none     Total Provider sedation time (minutes):  30 Post-procedure details:    Attendance: Constant attendance by certified staff until patient recovered     Recovery: Patient returned to pre-procedure baseline     Post-sedation assessments completed and reviewed: airway patency, cardiovascular function, hydration status, mental status, nausea/vomiting, pain level, respiratory function and temperature     Patient is stable for discharge or admission: yes     Procedure completion:  Tolerated well, no immediate complications     Medications Ordered in ED Medications  diltiazem (CARDIZEM) 125 mg in dextrose 5% 125 mL (1 mg/mL) infusion (0 mg/hr Intravenous Stopped 03/07/23 1948)  diltiazem (CARDIZEM) injection 10 mg (10 mg Intravenous Given 03/07/23 1658)  propofol (DIPRIVAN) 10 mg/mL bolus/IV push 49.5 mg (49.5 mg Intravenous Given 03/07/23 1957)    ED Course/ Medical Decision Making/ A&P                             Medical Decision Making Amount and/or Complexity of Data Reviewed Labs: ordered.  Risk Prescription drug management.    Medical Screen Complete  This patient presented to the ED with complaint of atrial fibrillation.  This complaint involves an extensive  number of treatment options. The initial differential diagnosis includes, but is not limited to, A-fib with RVR  This presentation is: Acute, Chronic, Self-Limited, Previously Undiagnosed, Uncertain Prognosis, Complicated, Systemic Symptoms, and Threat to Life/Bodily Function   Patient with known history of A-fib.  Status post ablation.  Now with recurrent symptoms.  Patient is compliant with Eliquis as previously prescribed.  Patient screening labs are without significant abnormality.  Patient is agreeable with plan  for sedation and cardioversion.  She tolerated this well.  She received 1 shock at 200 J with immediate conversion from A-fib with a rate in the 110 range to NSR.  After cardioversion she is comfortable.  She desires discharge home.  She does have established outpatient cardiology with plan for follow-up.   Additional history obtained:  Additional history obtained from [facility] External records from outside sources obtained and reviewed including prior ED visits and prior Inpatient records.    Lab Tests:  I ordered and personally interpreted labs.  The pertinent results include: CBC, BMP, troponin, magnesium   Cardiac Monitoring:  The patient was maintained on a cardiac monitor.  I personally viewed and interpreted the cardiac monitor which showed an underlying rhythm of: Afi   Medicines ordered:  I ordered medication including propofol  for sedation  Reevaluation of the patient after these medicines showed that the patient: improved   Problem List / ED Course:  AFIB with RVR   Reevaluation:  After the interventions noted above, I reevaluated the patient and found that they have: improved   Disposition:  After consideration of the diagnostic results and the patients response to treatment, I feel that the patent would benefit from close outpatient follow up.   CRITICAL CARE Performed by: Valarie Merino   Total critical care time: 30  minutes  Critical care time was exclusive of separately billable procedures and treating other patients.  Critical care was necessary to treat or prevent imminent or life-threatening deterioration.  Critical care was time spent personally by me on the following activities: development of treatment plan with patient and/or surrogate as well as nursing, discussions with consultants, evaluation of patient's response to treatment, examination of patient, obtaining history from patient or surrogate, ordering and performing treatments and interventions, ordering and review of laboratory studies, ordering and review of radiographic studies, pulse oximetry and re-evaluation of patient's condition.         Final Clinical Impression(s) / ED Diagnoses Final diagnoses:  Atrial fibrillation with RVR    Rx / DC Orders ED Discharge Orders     None         Valarie Merino, MD 03/07/23 2047

## 2023-03-07 NOTE — Telephone Encounter (Signed)
Patient notified per Dr. Domenic Polite that [facility] would be preferred as they would be better equipped to do the DCCV if needed.  Patient verbalized understanding.

## 2023-03-07 NOTE — Discharge Instructions (Signed)
Return for any problem.  ?

## 2023-03-07 NOTE — ED Notes (Signed)
Assuming care of pt

## 2023-03-07 NOTE — Telephone Encounter (Signed)
Patient notified, she prefers to call her daughter Lattie Haw) first to discuss.  She will let us know her decision shortly.

## 2023-03-07 NOTE — Telephone Encounter (Signed)
Spoke with patient - states that she has not had episode like this since she had her ablation.  She thinks that she may have caused the episode as she states that she had been overworking herself in the yard.  States she remembers the same thing happening before.  States she had HR 120-124 since about 4:30 am per her apple watch - did take 1/2 tab of her Lopressor at that time & took another 1/2 tab at 8:15 am.  States her BP running 98/70's - says this is pretty normal for her.  HR while on phone per apple watch is 123.  Did c/o some SOB and weak feeling in her legs.  No c/o chest pain.  She already has visit scheduled with you for 04/01/2023.  Stated she preferred to only see you instead of seeing NP for an earlier appointment.

## 2023-03-07 NOTE — Telephone Encounter (Signed)
STAT if HR is under 50 or over 120 (normal HR is 60-100 beats per minute)  What is your heart rate?  Around 120-124 right now   Do you have a log of your heart rate readings (document readings)?   Do you have any other symptoms? Weak and tired, pt states she had an episode of afib around 4:30am and now her hr is around 120-124 on her apple watch. She states she took a metoprolol around 4:30 and one around 8am. Please advise.

## 2023-03-07 NOTE — ED Triage Notes (Signed)
Pt c/o A-fib started at 0400 today. Pt c/o fatigue and SOB Pt denies chest pain. Pt not in resp distress at this time

## 2023-03-07 NOTE — ED Notes (Addendum)
Pt was shocked for cardioversion.

## 2023-03-07 NOTE — ED Notes (Signed)
Pt was taken out of atrial fibrillation. Pt is now in NSR with a HR of 56.

## 2023-03-11 ENCOUNTER — Encounter: Payer: Self-pay | Admitting: *Deleted

## 2023-03-12 ENCOUNTER — Ambulatory Visit (HOSPITAL_COMMUNITY): Payer: Medicare HMO | Admitting: Internal Medicine

## 2023-03-18 ENCOUNTER — Other Ambulatory Visit: Payer: Self-pay | Admitting: Cardiology

## 2023-04-01 ENCOUNTER — Ambulatory Visit: Payer: Medicare HMO | Attending: Cardiology | Admitting: Cardiology

## 2023-04-01 ENCOUNTER — Encounter: Payer: Self-pay | Admitting: Cardiology

## 2023-04-01 VITALS — BP 122/72 | HR 55 | Ht 65.0 in | Wt 215.4 lb

## 2023-04-01 DIAGNOSIS — Z8679 Personal history of other diseases of the circulatory system: Secondary | ICD-10-CM

## 2023-04-01 DIAGNOSIS — E782 Mixed hyperlipidemia: Secondary | ICD-10-CM | POA: Diagnosis not present

## 2023-04-01 DIAGNOSIS — I480 Paroxysmal atrial fibrillation: Secondary | ICD-10-CM | POA: Diagnosis not present

## 2023-04-01 NOTE — Patient Instructions (Signed)

## 2023-04-01 NOTE — Progress Notes (Signed)
Cardiology Office Note  Date: 04/01/2023   ID: Magin, Balbi 1946-02-23, MRN [id number]  History of Present Illness: Julie Jefferson is a 77 y.o. female last seen in October 2023.  She is here for a follow-up visit.  Overall doing well since recent cardioversion of recurrent atrial fibrillation in early April.  This was the only time she has had a major breakthrough events since ablation in 2022.  She remains on a stable dose of Lopressor with heart rate in the 50s and Eliquis otherwise.  I reviewed her ECG today which shows sinus bradycardia.  Recent lab work reviewed as well.  Physical Exam: VS:  BP 122/72   Pulse (!) 55   Ht 5\' 5"  (1.651 m)   Wt 215 lb 6.4 oz (97.7 kg)   SpO2 99%   BMI 35.84 kg/m , BMI Body mass index is 35.84 kg/m.  Wt Readings from Last 3 Encounters:  04/01/23 215 lb 6.4 oz (97.7 kg)  03/07/23 218 lb 0.6 oz (98.9 kg)  01/18/23 218 lb (98.9 kg)    General: Patient appears comfortable at rest. HEENT: Conjunctiva and lids normal. Lungs: Clear to auscultation, nonlabored breathing at rest. Cardiac: Regular rate and rhythm, no S3 or significant systolic murmur.  ECG:  An ECG dated 03/07/2023 was personally reviewed today and demonstrated:  Sinus rhythm.  Labwork: 07/11/2022: ALT 19; AST 22 03/07/2023: BUN 24; Creatinine, Ser 0.89; Hemoglobin 13.5; Magnesium 2.2; Platelets 368; Potassium 3.9; Sodium 139     Component Value Date/Time   CHOL 188 07/11/2022 0848   TRIG 184 (H) 07/11/2022 0848   HDL 60 07/11/2022 0848   CHOLHDL 2.3 10/25/2007 0058   VLDL 11 10/25/2007 0058   LDLCALC 97 07/11/2022 0848   Other Studies Reviewed Today:  Echocardiogram 03/27/2022:  1. Left ventricular ejection fraction, by estimation, is 70 to 75%. The  left ventricle has hyperdynamic function. The left ventricle has no  regional wall motion abnormalities. Left ventricular diastolic parameters  are indeterminate. The average left  ventricular global longitudinal  strain is -20.2 %. The global longitudinal  strain is normal.   2. Right ventricular systolic function is normal. The right ventricular  size is normal. Tricuspid regurgitation signal is inadequate for assessing  PA pressure.   3. The mitral valve is normal in structure. Mild mitral valve  regurgitation. No evidence of mitral stenosis.   4. The aortic valve is tricuspid. Aortic valve regurgitation is not  visualized. No aortic stenosis is present.   5. Pulmonic valve regurgitation is moderate.   6. The inferior vena cava is normal in size with greater than 50%  respiratory variability, suggesting right atrial pressure of 3 mmHg.   Assessment and Plan:  1.  Paroxysmal atrial fibrillation with CHA2DS2-VASc score of 5.  She is status post atrial fibrillation ablation in 2022 by Dr. Johney Frame.  She had a recurrence of atrial fibrillation in early April and was seen in the ER, underwent cardioversion at that time and doing well at this point.  She is in sinus rhythm by ECG today.  Continue Eliquis and Lopressor at current doses.  Continue with plan for observation at this time.  Can always get her back in with EP if breakthrough episodes increase.  2.  Mixed hyperlipidemia.  Continue Zocor with follow-up by PCP.  3.  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  Echocardiogram in April 2023 revealed LVEF 70 to 75%.  Disposition:  Follow up  6 months.  Signed, Jonelle Sidle, M.D., F.A.C.C. [facility] at [facility]

## 2023-05-06 ENCOUNTER — Other Ambulatory Visit: Payer: Self-pay | Admitting: *Deleted

## 2023-05-06 MED ORDER — METOPROLOL TARTRATE 25 MG PO TABS: 12.5000 mg | ORAL_TABLET | Freq: Two times a day (BID) | ORAL | 2 refills | Status: DC

## 2023-05-12 ENCOUNTER — Other Ambulatory Visit: Payer: Self-pay | Admitting: Cardiology

## 2023-06-01 ENCOUNTER — Other Ambulatory Visit: Payer: Self-pay | Admitting: Cardiology

## 2023-08-04 ENCOUNTER — Other Ambulatory Visit: Payer: Self-pay | Admitting: Cardiology

## 2023-08-09 ENCOUNTER — Ambulatory Visit: Payer: Medicare HMO | Attending: Cardiovascular Disease | Admitting: Cardiovascular Disease

## 2023-08-09 ENCOUNTER — Encounter: Payer: Self-pay | Admitting: Cardiovascular Disease

## 2023-08-09 VITALS — BP 130/70 | HR 65 | Ht 65.0 in | Wt 204.8 lb

## 2023-08-09 DIAGNOSIS — I480 Paroxysmal atrial fibrillation: Secondary | ICD-10-CM

## 2023-08-09 DIAGNOSIS — D6869 Other thrombophilia: Secondary | ICD-10-CM | POA: Diagnosis not present

## 2023-08-09 NOTE — Progress Notes (Signed)
  Electrophysiology Office Note:    Date:  08/09/2023   ID:  Julie Jefferson, DOB 11/03/1946, MRN [id number]  PCP:  Theodoro Kos, MD   [facility] Providers Cardiologist:  Nona Dell, MD Electrophysiologist:  Maurice Small, MD  Sleep Medicine:  Armanda Magic, MD     Referring MD: Theodoro Kos, MD   History of Present Illness:    Julie Jefferson is a 77 y.o. female with a medical history significant for paroxysmal atrial fibrillation, Takotsubo cardiomyopathy, obstructive sleep apnea, who presents for routine electrophysiology follow-up.     She has a history of paroxysmal atrial fibrillation and underwent an ablation by Dr. Johney Frame in October 2022.     She reports that she has been doing very well since her ablation and rarely has any symptoms of atrial fibrillation.  She did have a sustained episode several months ago and required DC cardioversion but has not had any other episodes of significant duration.  EKGs/Labs/Other Studies Reviewed Today:    Echocardiogram:  TTE March 27, 2022 Ejection fraction 70 to 75%.  Left atrium normal in size   Monitors:   Stress testing:    Advanced imaging:   Cardiac catherization    EKG:         Physical Exam:    VS:  BP 130/70   Pulse 65   Ht 5\' 5"  (1.651 m)   Wt 204 lb 12.8 oz (92.9 kg)   SpO2 95%   BMI 34.08 kg/m     Wt Readings from Last 3 Encounters:  08/09/23 204 lb 12.8 oz (92.9 kg)  04/01/23 215 lb 6.4 oz (97.7 kg)  03/07/23 218 lb 0.6 oz (98.9 kg)     GEN:  Well nourished, well developed in no acute distress CARDIAC: RRR, no murmurs, rubs, gallops RESPIRATORY:  Normal work of breathing MUSCULOSKELETAL: no edema    ASSESSMENT & PLAN:    Paroxysmal atrial fibrillation Rare recurrences -- she is symptomatic and aware of episodes Status post ablation by Dr. Johney Frame in October 2022  Secondary hypercoagulable state CHA2DS2-VASc score is at least 5 Continue Eliquis 5 mg twice  daily  Obesity BMI 34.08 -- improved since last visit with EP  Obstructive sleep apnea Uses CPAP I encouraged compliance with CPAP     Signed, Maurice Small, MD  08/09/2023 2:50 PM    [facility]

## 2023-08-09 NOTE — Patient Instructions (Signed)
Medication Instructions:  Continue all current medications.  Labwork: none  Testing/Procedures: none  Follow-Up: 1 year   Any Other Special Instructions Will Be Listed Below (If Applicable).  If you need a refill on your cardiac medications before your next appointment, please call your pharmacy.  

## 2023-09-14 ENCOUNTER — Other Ambulatory Visit: Payer: Self-pay | Admitting: Cardiology

## 2023-09-18 ENCOUNTER — Encounter: Payer: Self-pay | Admitting: Cardiology

## 2023-09-18 ENCOUNTER — Ambulatory Visit: Payer: Medicare HMO | Attending: Cardiology | Admitting: Cardiology

## 2023-09-18 VITALS — BP 124/70 | HR 65 | Ht 65.0 in | Wt 208.4 lb

## 2023-09-18 DIAGNOSIS — E782 Mixed hyperlipidemia: Secondary | ICD-10-CM

## 2023-09-18 DIAGNOSIS — Z8679 Personal history of other diseases of the circulatory system: Secondary | ICD-10-CM | POA: Diagnosis not present

## 2023-09-18 DIAGNOSIS — I480 Paroxysmal atrial fibrillation: Secondary | ICD-10-CM | POA: Diagnosis not present

## 2023-09-18 NOTE — Patient Instructions (Addendum)

## 2023-09-18 NOTE — Progress Notes (Signed)
Cardiology Office Note  Date: 09/18/2023   ID: Soleia, Badolato [birth year], MRN [id number]  History of Present Illness: Julie Jefferson is a 77 y.o. female last seen in April.  She is here for a routine visit.  She reports no prolonged palpitations since last encounter, only brief symptoms at times.  I reviewed her medications.  Current cardiac regimen includes Eliquis, Norvasc, Lopressor, and Zocor.  She does not report any spontaneous bleeding problems on Eliquis.  She had a follow-up visit with Dr. Nelly Laurence in September.  Does indicate some difficulty tolerating her CPAP mask, but does try as best she can and gets about 6 hours each night with it.  She has been having nausea and reflux symptoms in association with a large hiatal hernia, saw Dr. Elnoria Howard earlier today.  I reviewed the note.  She has not yet decided to consider surgery.  Physical Exam: VS:  BP 124/70 (BP Location: Right Arm)   Pulse 65   Ht 5\' 5"  (1.651 m)   Wt 208 lb 6.4 oz (94.5 kg)   SpO2 99%   BMI 34.68 kg/m , BMI Body mass index is 34.68 kg/m.  Wt Readings from Last 3 Encounters:  09/18/23 208 lb 6.4 oz (94.5 kg)  08/09/23 204 lb 12.8 oz (92.9 kg)  04/01/23 215 lb 6.4 oz (97.7 kg)    General: Patient appears comfortable at rest. HEENT: Conjunctiva and lids normal. Neck: Supple, no elevated JVP or carotid bruits. Lungs: Clear to auscultation, nonlabored breathing at rest. Cardiac: Regular rate and rhythm, no S3 or significant systolic murmur.  ECG:  An ECG dated 04/01/2023 was personally reviewed today and demonstrated:  Sinus bradycardia.  Labwork: November 2023: Cholesterol 178, triglycerides 146, HDL 65, LDL 84 03/07/2023: BUN 24; Creatinine, Ser 0.89; Hemoglobin 13.5; Magnesium 2.2; Platelets 368; Potassium 3.9; Sodium 139     Component Value Date/Time   CHOL 188 07/11/2022 0848   TRIG 184 (H) 07/11/2022 0848   HDL 60 07/11/2022 0848   CHOLHDL 2.3 10/25/2007 0058   VLDL 11 10/25/2007 0058    LDLCALC 97 07/11/2022 0848   Other Studies Reviewed Today:  Echocardiogram 03/27/2022:  1. Left ventricular ejection fraction, by estimation, is 70 to 75%. The  left ventricle has hyperdynamic function. The left ventricle has no  regional wall motion abnormalities. Left ventricular diastolic parameters  are indeterminate. The average left  ventricular global longitudinal strain is -20.2 %. The global longitudinal  strain is normal.   2. Right ventricular systolic function is normal. The right ventricular  size is normal. Tricuspid regurgitation signal is inadequate for assessing  PA pressure.   3. The mitral valve is normal in structure. Mild mitral valve  regurgitation. No evidence of mitral stenosis.   4. The aortic valve is tricuspid. Aortic valve regurgitation is not  visualized. No aortic stenosis is present.   5. Pulmonic valve regurgitation is moderate.   6. The inferior vena cava is normal in size with greater than 50%  respiratory variability, suggesting right atrial pressure of 3 mmHg.   Assessment and Plan:  1.  Paroxysmal atrial fibrillation with CHA2DS2-VASc score of 5.  She is status post atrial fibrillation ablation in 2022 by Dr. Johney Frame.  She had a recurrence of atrial fibrillation in early April and was seen in the ER, underwent cardioversion at that time and doing well at this point.  She has established EP follow-up with Dr. Nelly Laurence.  Plan to continue present therapy including Eliquis and  Lopressor.   2.  Mixed hyperlipidemia.  Continue Zocor.  3.  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  Echocardiogram in April 2023 revealed LVEF 70 to 75%.  Disposition:  Follow up  6 months.  Signed, Jonelle Sidle, M.D., F.A.C.C. [facility] at [facility]

## 2024-01-06 ENCOUNTER — Other Ambulatory Visit: Payer: Self-pay

## 2024-01-06 ENCOUNTER — Emergency Department (HOSPITAL_COMMUNITY)
Admission: EM | Admit: 2024-01-06 | Discharge: 2024-01-07 | Disposition: A | Discharge status: Home / Self Care | Payer: Medicare HMO | Attending: Emergency Medicine | Admitting: Emergency Medicine

## 2024-01-06 ENCOUNTER — Telehealth: Payer: Self-pay | Admitting: Cardiovascular Disease

## 2024-01-06 ENCOUNTER — Encounter (HOSPITAL_COMMUNITY): Payer: Self-pay

## 2024-01-06 DIAGNOSIS — I10 Essential (primary) hypertension: Secondary | ICD-10-CM | POA: Insufficient documentation

## 2024-01-06 DIAGNOSIS — Z853 Personal history of malignant neoplasm of breast: Secondary | ICD-10-CM | POA: Diagnosis not present

## 2024-01-06 DIAGNOSIS — Z7901 Long term (current) use of anticoagulants: Secondary | ICD-10-CM | POA: Diagnosis not present

## 2024-01-06 DIAGNOSIS — I4891 Unspecified atrial fibrillation: Secondary | ICD-10-CM | POA: Diagnosis not present

## 2024-01-06 DIAGNOSIS — Z91040 Latex allergy status: Secondary | ICD-10-CM | POA: Insufficient documentation

## 2024-01-06 DIAGNOSIS — R002 Palpitations: Secondary | ICD-10-CM | POA: Diagnosis present

## 2024-01-06 DIAGNOSIS — Z79899 Other long term (current) drug therapy: Secondary | ICD-10-CM | POA: Diagnosis not present

## 2024-01-06 LAB — CBC
HCT: 42.1 % (ref 36.0–46.0)
Hemoglobin: 13.4 g/dL (ref 12.0–15.0)
MCH: 27.8 pg (ref 26.0–34.0)
MCHC: 31.8 g/dL (ref 30.0–36.0)
MCV: 87.3 fL (ref 80.0–100.0)
Platelets: 350 10*3/uL (ref 150–400)
RBC: 4.82 MIL/uL (ref 3.87–5.11)
RDW: 15.5 % (ref 11.5–15.5)
WBC: 8.4 10*3/uL (ref 4.0–10.5)
nRBC: 0 % (ref 0.0–0.2)

## 2024-01-06 LAB — BASIC METABOLIC PANEL
Anion gap: 13 (ref 5–15)
BUN: 18 mg/dL (ref 8–23)
CO2: 21 mmol/L — ABNORMAL LOW (ref 22–32)
Calcium: 9.7 mg/dL (ref 8.9–10.3)
Chloride: 105 mmol/L (ref 98–111)
Creatinine, Ser: 0.77 mg/dL (ref 0.44–1.00)
GFR, Estimated: >60 mL/min (ref >60)
Glucose, Bld: 100 mg/dL — ABNORMAL HIGH (ref 70–99)
Potassium: 4 mmol/L (ref 3.5–5.1)
Sodium: 139 mmol/L (ref 135–145)

## 2024-01-06 NOTE — ED Provider Triage Note (Signed)
Emergency Medicine Provider Triage Evaluation Note  Julie Jefferson , a 78 y.o. female  was evaluated in triage.  Pt complains of irregular heart beat. Pt with hx of afib, felt herself go into afib this AM with rates around 140 but believes she self converted about 30 minutes ago. Some residual chest tightness. Cardiologist sent her.   Review of Systems  Positive: Chest tightness, palpitations Negative:   Physical Exam  Ht 5\' 5"  (1.651 m)   Wt 94.3 kg   BMI 34.61 kg/m  Gen:   Awake, no distress   Resp:  Normal effort  MSK:   Moves extremities without difficulty  Other:    Medical Decision Making  Medically screening exam initiated at 5:22 PM.  Appropriate orders placed.  Ananias Pilgrim Barhorst was informed that the remainder of the evaluation will be completed by another provider, this initial triage assessment does not replace that evaluation, and the importance of remaining in the ED until their evaluation is complete.  Workup initiated   Antawn Sison T, PA-C 01/06/24 1722

## 2024-01-06 NOTE — Telephone Encounter (Signed)
Patient c/o Palpitations:  High priority if patient c/o lightheadedness, shortness of breath, or chest pain  How long have you had palpitations/irregular HR/ Afib? Are you having the symptoms now? yes  Are you currently experiencing lightheadedness, SOB or CP? Tightness in chest  Do you have a history of afib (atrial fibrillation) or irregular heart rhythm? Yes   Have you checked your BP or HR? (document readings if available): no  Are you experiencing any other symptoms?    Patient took two aspirins hr after taking the meteroprol. Please advise

## 2024-01-06 NOTE — Telephone Encounter (Signed)
Per Dr.Ross, patient needs to be evaluated in the ER,patient agrees to go to [facility] ED

## 2024-01-06 NOTE — ED Triage Notes (Signed)
Pt reports that she has been in afib (140s) all day today. Pt also endorses SHOB and tightness in her chest. Pt states "I think I converted into sinus rhythm but my cardiologist wants me to be seen"

## 2024-01-06 NOTE — Telephone Encounter (Signed)
   I have routed to [facility]

## 2024-01-07 ENCOUNTER — Telehealth (HOSPITAL_COMMUNITY): Payer: Self-pay

## 2024-01-07 MED ORDER — METOPROLOL TARTRATE 25 MG PO TABS
12.5000 mg | ORAL_TABLET | Freq: Two times a day (BID) | ORAL | Status: DC
  Administered 2024-01-07: 12.5 mg via ORAL
  Filled 2024-01-07: qty 1

## 2024-01-07 NOTE — ED Notes (Signed)
CCMD called and placed on monitoring

## 2024-01-07 NOTE — Telephone Encounter (Signed)
Reached out to patient to schedule ED f/u. Patient stated that she wants to see [facility].

## 2024-01-07 NOTE — ED Provider Notes (Signed)
 [place] EMERGENCY DEPARTMENT AT [facility] Provider Note   CSN: [id number] Arrival date & time: 01/06/24  1655     History  Chief Complaint  Patient presents with   Irregular Heart Beat    Julie Jefferson is a 78 y.o. female.  HPI Patient presents for palpitations.  Medical history includes HTN, HLD, GERD, breast cancer, arthritis, atrial fibrillation.  Current cardiac medications include Eliquis , Norvasc , Lopressor .  She took her normal medications this morning.  Shortly thereafter, at 7 AM, she had onset of palpitations.  Her watch told her that her heart rate was in the range of 140s.  She called her cardiologist.  Cardiologist advised her to come to the ED.  Patient had persistent tachycardia throughout the early part of the day.  At noon, she took an extra dose of her metoprolol .  At 5 PM, she had resolution of symptoms.  This was shortly prior to her arrival in the ED.  Patient has remained asymptomatic since that time.      Home Medications Prior to Admission medications   Medication Sig Start Date End Date Taking? Authorizing Provider  acetaminophen  (TYLENOL ) 650 MG CR tablet Take 650 mg by mouth at bedtime.    [provider]  amLODipine  (NORVASC ) 5 MG tablet Take 5 mg by mouth daily.  10/03/11   [provider]  Biotin 89999 MCG TABS Take 10,000 mcg by mouth at bedtime.    [provider]  Dorzolamide  HCl-Timolol  Mal PF 2-0.5 % SOLN Apply 1 drop to eye 2 (two) times daily. 01/02/23   [provider]  ELIQUIS  5 MG TABS tablet TAKE 1 TABLET BY MOUTH TWICE A DAY 06/03/23   Debera Jayson MATSU, MD  levothyroxine  (SYNTHROID ) 112 MCG tablet Take 112 mcg by mouth daily before breakfast.    [provider]  meclizine  (ANTIVERT ) 25 MG tablet Take 25 mg by mouth 3 (three) times daily as needed for dizziness.    [provider]  metoprolol  tartrate (LOPRESSOR ) 25 MG tablet Take 0.5 tablets (12.5 mg total) by mouth 2 (two)  times daily. 05/06/23   Debera Jayson MATSU, MD  Omeprazole (PRILOSEC PO) Take 40 mg by mouth 2 (two) times daily.    [provider]  OVER THE COUNTER MEDICATION Take 1 capsule by mouth 2 (two) times daily. [employer] Supplement    [provider]  Polyethyl Glyc-Propyl Glyc PF (SYSTANE HYDRATION PF) 0.4-0.3 % SOLN Place 1 drop into both eyes daily as needed (Irritation).    [provider]  polyethylene glycol powder (GLYCOLAX /MIRALAX ) powder Take 17 g by mouth daily as needed for mild constipation.     [provider]  simvastatin  (ZOCOR ) 20 MG tablet TAKE 1 TABLET BY MOUTH AT BEDTIME 08/06/23   Debera Jayson MATSU, MD  sucralfate  (CARAFATE ) 1 g tablet Take 1 g by mouth daily as needed (Indigestion).    [provider]      Allergies    Alphagan [brimonidine], Dorzolamide  hcl, Penicillins, and Latex    Review of Systems   Review of Systems  Cardiovascular:  Positive for palpitations.  All other systems reviewed and are negative.   Physical Exam Updated Vital Signs BP (!) 167/73   Pulse (!) 54   Temp 97.9 F (36.6 C) (Oral)   Resp 13   Ht 5' 5 (1.651 m)   Wt 94.3 kg   SpO2 100%   BMI 34.61 kg/m  Physical Exam Vitals and nursing note reviewed.  Constitutional:      General: She is not in acute distress.    Appearance: Normal appearance. She is well-developed. She is not ill-appearing, toxic-appearing or diaphoretic.  HENT:     Head: Normocephalic and atraumatic.     Right Ear: External ear normal.     Left Ear: External ear normal.     Nose: Nose normal.     Mouth/Throat:     Mouth: Mucous membranes are moist.  Eyes:     Extraocular Movements: Extraocular movements intact.     Conjunctiva/sclera: Conjunctivae normal.  Cardiovascular:     Rate and Rhythm: Normal rate and regular rhythm.     Heart sounds: No murmur heard. Pulmonary:     Effort: Pulmonary effort is normal. No respiratory distress.     Breath sounds: Normal breath  sounds. No wheezing or rales.  Abdominal:     General: There is no distension.     Palpations: Abdomen is soft.     Tenderness: There is no abdominal tenderness.  Musculoskeletal:        General: No swelling. Normal range of motion.     Cervical back: Normal range of motion and neck supple.  Skin:    General: Skin is warm and dry.     Coloration: Skin is not jaundiced or pale.  Neurological:     General: No focal deficit present.     Mental Status: She is alert and oriented to person, place, and time.  Psychiatric:        Mood and Affect: Mood normal.        Behavior: Behavior normal.     ED Results / Procedures / Treatments   Labs (all labs ordered are listed, but only abnormal results are displayed) Labs Reviewed  BASIC METABOLIC PANEL - Abnormal; Notable for the following components:      Result Value   CO2 21 (*)    Glucose, Bld 100 (*)    All other components within normal limits  CBC    EKG EKG Interpretation Date/Time:  Tuesday January 07 2024 05:26:39 EST Ventricular Rate:  58 PR Interval:  149 QRS Duration:  88 QT Interval:  438 QTC Calculation: 431 R Axis:   34  Text Interpretation: Sinus rhythm Atrial premature complex Low voltage, precordial leads Confirmed by Melvenia Motto ([id number]) on 01/07/2024 6:25:42 AM  Radiology No results found.  Procedures Procedures    Medications Ordered in ED Medications  metoprolol  tartrate (LOPRESSOR ) tablet 12.5 mg (has no administration in time range)    ED Course/ Medical Decision Making/ A&P                                 Medical Decision Making Risk Prescription drug management.   This patient presents to the ED for concern of palpitations, this involves an extensive number of treatment options, and is a complaint that carries with it a high risk of complications and morbidity.  The differential diagnosis includes arrhythmia, anxiety, dehydration, anemia, metabolic derangements   Co morbidities that  complicate the patient evaluation  HTN, HLD, GERD, breast cancer, arthritis, atrial fibrillation   Additional history obtained:  Additional history obtained from N/A External records from outside source obtained and reviewed including EMR   Lab Tests:  I Ordered, and personally interpreted labs.  The pertinent results include: Normal hemoglobin, no leukocytosis, normal electrolytes, normal kidney function  Cardiac Monitoring: / EKG:  The patient was  maintained on a cardiac monitor.  I personally viewed and interpreted the cardiac monitored which showed an underlying rhythm of: Sinus rhythm  Problem List / ED Course / Critical interventions / Medication management  Patient presenting for palpitations.  History is consistent with atrial fibrillation with RVR.  She did spontaneously convert at 5 PM.  This was around the time of her arrival in the ED.  She remained in ED waiting room for approximately 12 hours.  She has been asymptomatic throughout that time.  On assessment, patient is well-appearing.  Lab work, obtained prior to patient being bedded in the ED, shows normal electrolytes.  Patient was placed on bedside cardiac monitor.  Morning dose of metoprolol  was ordered.  Patient remained in sinus rhythm.  Patient to follow-up with cardiology in office.  He was discharged in stable condition. I ordered medication including metoprolol  for atrial fibrillation Reevaluation of the patient after these medicines showed that the patient improved I have reviewed the patients home medicines and have made adjustments as needed   Social Determinants of Health:  Has access to outpatient care         Final Clinical Impression(s) / ED Diagnoses Final diagnoses:  Atrial fibrillation with RVR (HCC)    Rx / DC Orders ED Discharge Orders          Ordered    Ambulatory referral to Cardiology  Status:  Canceled       Comments: If you have not heard from the Cardiology office within the  next 72 hours please call [phone].   01/07/24 [id number]    Ambulatory referral to Cardiology       Comments: If you have not heard from the Cardiology office within the next 72 hours please call [phone].   01/07/24 [id number]              Melvenia Motto, MD 01/07/24 [phone]

## 2024-01-07 NOTE — Discharge Instructions (Addendum)
Continue home medications as prescribed.  Follow-up with your cardiologist.  Take additional dose of your metoprolol as needed if heart rate increases.  Return to the emergency department for any new or worsening symptoms of concern.

## 2024-01-21 ENCOUNTER — Other Ambulatory Visit: Payer: Self-pay | Admitting: Cardiology

## 2024-01-21 NOTE — Telephone Encounter (Signed)
 Prescription refill request for Eliquis received. Indication: PAF Last office visit: 09/18/23  Ival Bible MD Scr: 0.77 on 01/06/24  Epic Age: 78 Weight: 94.5kg  Based on above findings Eliquis 5mg  twice daily is the appropriate dose.  Refill approved.

## 2024-01-30 ENCOUNTER — Other Ambulatory Visit: Payer: Self-pay | Admitting: Cardiology

## 2024-02-17 ENCOUNTER — Other Ambulatory Visit (HOSPITAL_COMMUNITY): Payer: Self-pay | Admitting: General Surgery

## 2024-02-17 ENCOUNTER — Encounter: Payer: Self-pay | Admitting: Nurse Practitioner

## 2024-02-17 ENCOUNTER — Ambulatory Visit: Payer: Medicare HMO | Attending: Nurse Practitioner | Admitting: Nurse Practitioner

## 2024-02-17 VITALS — BP 130/70 | HR 58 | Ht 65.0 in | Wt 200.0 lb

## 2024-02-17 DIAGNOSIS — I2510 Atherosclerotic heart disease of native coronary artery without angina pectoris: Secondary | ICD-10-CM

## 2024-02-17 DIAGNOSIS — E782 Mixed hyperlipidemia: Secondary | ICD-10-CM

## 2024-02-17 DIAGNOSIS — E66811 Obesity, class 1: Secondary | ICD-10-CM

## 2024-02-17 DIAGNOSIS — I5181 Takotsubo syndrome: Secondary | ICD-10-CM

## 2024-02-17 DIAGNOSIS — K449 Diaphragmatic hernia without obstruction or gangrene: Secondary | ICD-10-CM

## 2024-02-17 DIAGNOSIS — I480 Paroxysmal atrial fibrillation: Secondary | ICD-10-CM

## 2024-02-17 DIAGNOSIS — I10 Essential (primary) hypertension: Secondary | ICD-10-CM

## 2024-02-17 DIAGNOSIS — R002 Palpitations: Secondary | ICD-10-CM

## 2024-02-17 DIAGNOSIS — I340 Nonrheumatic mitral (valve) insufficiency: Secondary | ICD-10-CM

## 2024-02-17 MED ORDER — METOPROLOL TARTRATE 25 MG PO TABS: 12.5000 mg | ORAL_TABLET | Freq: Two times a day (BID) | ORAL | Status: DC

## 2024-02-17 NOTE — Progress Notes (Unsigned)
 Cardiology Office Note:  .   Date:  02/17/2024 ID:  BERLENE DIXSON, DOB [birth year], MRN [id number] PCP: Theodoro Kos, MD  [facility] Providers Cardiologist:  Nona Dell, MD Electrophysiologist:  Maurice Small, MD  Sleep Medicine:  Armanda Magic, MD    History of Present Illness: Julie Jefferson is a 78 y.o. female with a PMH of PAF, s/p ablation in 2022, mixed hyperlipidemia, hypertension, history of stress-induced cardiomyopathy in 2008, minor coronary atherosclerosis noted at the time, history of breast cancer, GERD, who presents today for scheduled follow-up.  ED visit on January 07, 2024 due to palpitations.  Her smart watch told her heart rate was in 140s.  She was advised to come to the ED.  Had persistent palpitations earlier in the day, took extra dose of metoprolol at home, later that afternoon her symptoms resolved.  EKG on arrival showed normal sinus rhythm, no acute ischemic changes.  She was given IV Lopressor in the ED.  EKG prior to discharge showed sinus rhythm with PAC, 58 bpm.  Today she presents for ED follow-up. She states her episode happened in the middle of the night, tends to happen when she overexerts herself, she could tell she had converted prior to ED arrival. She has learned to compensate for her palpitations by stopping when these episodes occur and will rest. Says it is not frequent when it happens. Overall doing well. Denies any recent chest pain, shortness of breath, palpitations, syncope, presyncope, dizziness, orthopnea, PND, swelling or significant weight changes, acute bleeding, or claudication.  ROS: Negative. See HPI.  SH: Daughter is Charity fundraiser and does administrative work for [employer]  Studies Reviewed: Marland Kitchen   EKG Interpretation Date/Time:  Monday February 17 2024 15:26:42 EDT Ventricular Rate:  58 PR Interval:  150 QRS Duration:  90 QT Interval:  442 QTC Calculation: 433 R Axis:   80  Text Interpretation: Sinus  bradycardia Low voltage QRS Septal infarct , age undetermined When compared with ECG of 07-Jan-2024 05:26, PREVIOUS ECG IS PRESENT Confirmed by Sharlene Dory [phone]) on 02/17/2024 3:35:17 PMEKG: EKG Interpretation Date/Time:  Monday February 17 2024 15:26:42 EDT Ventricular Rate:  58 PR Interval:  150 QRS Duration:  90 QT Interval:  442 QTC Calculation: 433 R Axis:   80  Text Interpretation: Sinus bradycardia Low voltage QRS Septal infarct , age undetermined When compared with ECG of 07-Jan-2024 05:26, PREVIOUS ECG IS PRESENT Confirmed by Sharlene Dory [phone]) on 02/17/2024 3:35:17 PM   Echo 03/2022:  1. Left ventricular ejection fraction, by estimation, is 70 to 75%. The  left ventricle has hyperdynamic function. The left ventricle has no  regional wall motion abnormalities. Left ventricular diastolic parameters  are indeterminate. The average left  ventricular global longitudinal strain is -20.2 %. The global longitudinal  strain is normal.   2. Right ventricular systolic function is normal. The right ventricular  size is normal. Tricuspid regurgitation signal is inadequate for assessing  PA pressure.   3. The mitral valve is normal in structure. Mild mitral valve  regurgitation. No evidence of mitral stenosis.   4. The aortic valve is tricuspid. Aortic valve regurgitation is not  visualized. No aortic stenosis is present.   5. Pulmonic valve regurgitation is moderate.   6. The inferior vena cava is normal in size with greater than 50%  respiratory variability, suggesting right atrial pressure of 3 mmHg.   Comparison(s): LVEF 60-65%.  CT cardiac 09/2021:  IMPRESSION: 1. There  is normal pulmonary vein drainage into the left atrium.   2. The left atrial appendage is large broccoli type with two lobes and ostial size 26 x 22 mm and length 34 mm. There is no thrombus in the left atrial appendage.   3. The esophagus runs in the left atrial midline and is closest to the left lower  pulmonary vein.   4. Diffuse coronary calcification. Coronary calcium score 401, which is 83rd percentile for age and gender.  IMPRESSION: 1.  Aortic Atherosclerosis (ICD10-I70.0). 2. Moderate-sized hiatal hernia.  Lexiscan 05/2019: No diagnostic ST segment changes to indicate ischemia. Small, mild intensity, partially reversible apical anteroseptal defect that is most consistent with variable soft tissue attenuation. No large ischemic territories noted. This is a low risk study. Nuclear stress EF: 71%.  Physical Exam:   VS:  BP 130/70 (BP Location: Right Arm, Cuff Size: Normal)   Pulse (!) 58   Ht 5\' 5"  (1.651 m)   Wt 200 lb (90.7 kg)   BMI 33.28 kg/m    Wt Readings from Last 3 Encounters:  02/17/24 200 lb (90.7 kg)  01/06/24 208 lb (94.3 kg)  09/18/23 208 lb 6.4 oz (94.5 kg)    GEN: Well nourished, well developed in no acute distress NECK: No JVD; No carotid bruits CARDIAC: S1/S2, RRR, no murmurs, rubs, gallops RESPIRATORY:  Clear to auscultation without rales, wheezing or rhonchi  ABDOMEN: Soft, non-tender, non-distended EXTREMITIES:  No edema; No deformity   ASSESSMENT AND PLAN: .    PAF, palpitations Denies any recurrent palpitations or tachycardia. HR well controlled today, asymptomatic. Continue Lopressor, and instructed she may take an extra 12.5 mg of Lopressor daily PRN for palpitations. Continue Eliquis for stroke prevention. She is on appropriate dosage and denies any bleeding issues. Heart healthy diet and regular cardiovascular exercise encouraged. Continue to follow-up with EP.   Mild coronary atherosclerosis Stable with no anginal symptoms. No indication for ischemic evaluation. Hx of minor coronary atherosclerosis. Not on ASA d/t being on Eliquis. Continue rest of medication regimen. Heart healthy diet and regular cardiovascular exercise as tolerated encouraged.   Mixed HLD LDL 71 10/2023. Continue simvastatin. Heart healthy diet and regular cardiovascular  exercise as tolerated encouraged.   HTN BP stable. Discussed to monitor BP at home at least 2 hours after medications and sitting for 5-10 minutes. No medication changes at this time. Heart healthy diet and regular cardiovascular exercise as tolerated encouraged.   Hx of stress induced CM TEE 03/2022 revealed EF 70-75%. Euvolemic and well compensated on exam. No medication changes at this time. Heart healthy diet and regular cardiovascular exercise as tolerated encouraged.   6. Mild mitral valve regurgitation Mild MR noted on Echo in 2023. Plan to update Echo at next follow-up.    Dispo: Follow-up with Dr. Diona Browner or APP in 6 months or sooner if anything changes.   Signed, Sharlene Dory, NP

## 2024-02-17 NOTE — Patient Instructions (Signed)
 Medication Instructions:  Your physician has recommended you make the following change in your medication:  Please Increase Lopressor to Take 0.5 tablets (12.5 mg total) by mouth 2 (two) times daily. With an extra 0.5 tablet (12.5 Mg) as needed for palpitations   Labwork: None   Testing/Procedures: None   Follow-Up: Your physician recommends that you schedule a follow-up appointment in: 6 months   Any Other Special Instructions Will Be Listed Below (If Applicable).  If you need a refill on your cardiac medications before your next appointment, please call your pharmacy.

## 2024-02-28 ENCOUNTER — Ambulatory Visit (HOSPITAL_COMMUNITY)
Admission: RE | Admit: 2024-02-28 | Discharge: 2024-02-28 | Disposition: A | Discharge status: Home / Self Care | Source: Ambulatory Visit | Attending: General Surgery | Admitting: General Surgery

## 2024-02-28 DIAGNOSIS — E66811 Obesity, class 1: Secondary | ICD-10-CM | POA: Insufficient documentation

## 2024-02-28 DIAGNOSIS — K449 Diaphragmatic hernia without obstruction or gangrene: Secondary | ICD-10-CM | POA: Diagnosis present

## 2024-03-09 ENCOUNTER — Encounter: Payer: Self-pay | Admitting: General Surgery

## 2024-04-02 ENCOUNTER — Ambulatory Visit: Payer: Self-pay | Admitting: General Surgery

## 2024-04-02 ENCOUNTER — Telehealth: Payer: Self-pay | Admitting: Cardiology

## 2024-04-02 DIAGNOSIS — K219 Gastro-esophageal reflux disease without esophagitis: Secondary | ICD-10-CM

## 2024-04-02 DIAGNOSIS — K449 Diaphragmatic hernia without obstruction or gangrene: Secondary | ICD-10-CM

## 2024-04-02 NOTE — Progress Notes (Signed)
 Surgery orders requested via Epic inbox.

## 2024-04-02 NOTE — Telephone Encounter (Signed)
   Pre-operative Risk Assessment    Patient Name: Julie Jefferson  DOB: 06-24-1946 MRN: [id number]      Request for Surgical Clearance    Procedure:   Hiatal Hernia Repair  Date of Surgery:  Clearance 04/16/24                                 Surgeon:  Alvah Auerbach Surgeon's Group or Practice Name:  [facility]  Phone number:  [phone] Fax number:  [phone]   Type of Clearance Requested:   - Pharmacy:  Hold Apixaban  (Eliquis ) Please advise office of how long pt should hold   Type of Anesthesia:  General    Additional requests/questions:  Please advise surgeon/provider what medications should be held. Please fax a copy of Clearance  to the surgeon's office.  Signed, Georgia Kipper   04/02/2024, 1:30 PM

## 2024-04-07 NOTE — Telephone Encounter (Signed)
 I will forward this to pre op APP to review.

## 2024-04-07 NOTE — Telephone Encounter (Signed)
 Ms. Joens perioperative risk of a major cardiac event is 0.9% according to the Revised Cardiac Risk Index (RCRI).  Therefore, she is at low risk for perioperative complications.   Her functional capacity is good at > 4 METs according to the Duke Activity Status Index (DASI). Recommendations: According to ACC/AHA guidelines, no further cardiovascular testing needed.  The patient may proceed to surgery at acceptable risk.   Antiplatelet and/or Anticoagulation Recommendations: Eliquis  (Apixaban ) can be held for   days prior to surgery.  Please resume post op when felt to be safe.     Will route note to clinical pharm D to comment on holding time for Eliquis  and then will route note to pre-op pool once I hear back and after I have done an addendum to this note.      Lasalle Pointer, NP

## 2024-04-07 NOTE — Progress Notes (Signed)
 COVID Vaccine received:  []  No [x]  Yes Date of any COVID positive Test in last 46 days:none  PCP - Michele Ahle, MD  in [place]  Phone: [phone]  Fax: [phone]  Cardiologist - Teddie Favre, MD  EP- Marlane Silver, MD  Sleep med.Gaylyn Keas, MD Endocrinology- Gordy Lauber, MD   Chest x-ray - 10-02-2020  2v  Epic EKG - 02-17-2024  Epic  Stress Test - 05-14-2019  Epic ECHO - 03-27-2022  Epic Cardiac Cath - 10-2007  for Takotsubo CM A.fib Ablation- 09-21-2021  Bowel Prep - [x]  No  []   Yes ______  Pacemaker / ICD device [x]  No []  Yes   Spinal Cord Stimulator:[]  No []  Yes       History of Sleep Apnea? []  No [x]  Yes   CPAP used?- [x]  No []  Yes  can't tolerate  Does the patient monitor blood sugar?   []  N/A   [x]  No []  Yes  Patient has: []  NO Hx DM   [x]  Pre-DM   []  DM1  []   DM2 Last A1c was: 6.1 on 10-29-2023      no meds.   Blood Thinner / Instructions: Eliquis   Hold x 48 hours,   Last dose will be on 04-13-24 Aspirin  Instructions:  none  ERAS Protocol Ordered: []  No  [x]  Yes PRE-SURGERY []  ENSURE  []  G2   [x]  No Drink Ordered Patient is to be NPO after: 0630  Dental hx: []  Dentures:  []  N/A      [x]  Bridge or Partial: 2 bridges on the top                  []  Loose or Damaged teeth:   Activity level: Patient is able to climb a flight of stairs without difficulty; [x]  No CP  but would have _SOB. Patient can perform ADLs without assistance.   Patient's daughter, Otto Bloodgood, is an ED nurse, Patent attorney at [employer].   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  Patient verbalized understanding and agreement to the Pre-Surgical Instructions that were given to them at this PAT appointment. Patient was also educated of the need to review these PAT instructions again prior to her surgery.I reviewed the  appropriate phone numbers to call if they have any and questions or concerns.

## 2024-04-07 NOTE — Telephone Encounter (Signed)
 Julie Jefferson, patient's chart was reviewed for preoperative cardiac evaluation for upcoming hernia surgery. She was seen by you on 02/17/2024 and according to protocol, we request that you comment on cardiac risk for upcoming procedure since office visit was less than 2 months ago.    Please route your response to p cv div preop.  Thank you, Gerldine Koch, NP-C 04/07/2024, 3:53 PM

## 2024-04-07 NOTE — Patient Instructions (Signed)
 SURGICAL WAITING ROOM VISITATION Patients having surgery or a procedure may have no more than 2 support people in the waiting area - these visitors may rotate in the visitor waiting room.   If the patient needs to stay at the hospital during part of their recovery, the visitor guidelines for inpatient rooms apply.  PRE-OP VISITATION  Pre-op nurse will coordinate an appropriate time for 1 support person to accompany the patient in pre-op.  This support person may not rotate.  This visitor will be contacted when the time is appropriate for the visitor to come back in the pre-op area.  Please refer to the [facility] website for the visitor guidelines for Inpatients (after your surgery is over and you are in a regular room).  You are not required to quarantine at this time prior to your surgery. However, you must do this: Hand Hygiene often Do NOT share personal items Notify your provider if you are in close contact with someone who has COVID or you develop fever 100.4 or greater, new onset of sneezing, cough, sore throat, shortness of breath or body aches.  If you test positive for Covid or have been in contact with anyone that has tested positive in the last 10 days please notify you surgeon.    Your procedure is scheduled on:  Thursday  Apr 16, 2024  Report to [facility] Main Entrance: [address] entrance where the [employer] is available.   Report to admitting at:  07:15   AM  Call this number if you have any questions or problems the morning of surgery [phone]  FOLLOW ANY ADDITIONAL PRE OP INSTRUCTIONS YOU RECEIVED FROM YOUR SURGEON'S OFFICE!!!  Do not eat food after Midnight the night prior to your surgery/procedure.  After Midnight you may have the following liquids until  06:30 AM DAY OF SURGERY  Clear Liquid Diet Water  Black Coffee (sugar ok, NO MILK/CREAM OR CREAMERS)  Tea (sugar ok, NO MILK/CREAM OR CREAMERS) regular and decaf                              Plain Jell-O  with no fruit (NO RED)                                           Fruit ices (not with fruit pulp, NO RED)                                     Popsicles (NO RED)                                                                  Juice: NO CITRUS JUICES: only apple, WHITE grape, WHITE cranberry Sports drinks like Gatorade or Powerade (NO RED)                 Oral Hygiene is also important to reduce your risk of infection.        Remember - BRUSH YOUR TEETH THE MORNING OF SURGERY WITH YOUR REGULAR TOOTHPASTE  Do NOT  smoke after Midnight the night before surgery.  STOP TAKING all Vitamins, Herbs and supplements 1 week before your surgery.   ELIQUIS - Stop taking 48 hours before your surgery. Last dose will be taken on Monday 04-13-24  Take ONLY these medicines the morning of surgery with A SIP OF WATER : levothyroxine , amlodipine , metoprolol  and you may use your Eye drops.   If You have been diagnosed with Sleep Apnea - Bring CPAP mask and tubing day of surgery. We will provide you with a CPAP machine on the day of your surgery.                   You may not have any metal on your body including hair pins, jewelry, and body piercing  Do not wear make-up, lotions, powders, perfumes  or deodorant  Do not wear nail polish including gel and S&S, artificial / acrylic nails, or any other type of covering on natural nails including finger and toenails. If you have artificial nails, gel coating, etc., that needs to be removed by a nail salon, Please have this removed prior to surgery. Not doing so may mean that your surgery could be cancelled or delayed if the Surgeon or anesthesia staff feels like they are unable to monitor you safely.   Do not shave 48 hours prior to surgery to avoid nicks in your skin which may contribute to postoperative infections.   Contacts, Hearing Aids, dentures or bridgework may not be worn into surgery. DENTURES WILL BE REMOVED PRIOR TO SURGERY PLEASE DO NOT  APPLY "Poly grip" OR ADHESIVES!!!  You may bring a small overnight bag with you on the day of surgery, only pack items that are not valuable. [facility] IS NOT RESPONSIBLE   FOR VALUABLES THAT ARE LOST OR STOLEN.   Do not bring your home medications to the hospital. The Pharmacy will dispense medications listed on your medication list to you during your admission in the Hospital.  Please read over the following fact sheets you were given: IF YOU HAVE QUESTIONS ABOUT YOUR PRE-OP INSTRUCTIONS, PLEASE CALL [phone]   [facility] - Preparing for Surgery Before surgery, you can play an important role.  Because skin is not sterile, your skin needs to be as free of germs as possible.  You can reduce the number of germs on your skin by washing with CHG (chlorahexidine gluconate) soap before surgery.  CHG is an antiseptic cleaner which kills germs and bonds with the skin to continue killing germs even after washing. Please DO NOT use if you have an allergy to CHG or antibacterial soaps.  If your skin becomes reddened/irritated stop using the CHG and inform your nurse when you arrive at Short Stay. Do not shave (including legs and underarms) for at least 48 hours prior to the first CHG shower.  You may shave your face/neck.  Please follow these instructions carefully:  1.  Shower with CHG Soap the night before surgery and the  morning of surgery.  2.  If you choose to wash your hair, wash your hair first as usual with your normal  shampoo.  3.  After you shampoo, rinse your hair and body thoroughly to remove the shampoo.                             4.  Use CHG as you would any other liquid soap.  You can apply chg directly to the skin and wash.  Gently with a scrungie or clean washcloth.  5.  Apply the CHG Soap to your body ONLY FROM THE NECK DOWN.   Do not use on face/ open                           Wound or open sores. Avoid contact with eyes, ears mouth and genitals (private parts).                        Wash face,  Genitals (private parts) with your normal soap.             6.  Wash thoroughly, paying special attention to the area where your  surgery  will be performed.  7.  Thoroughly rinse your body with warm water  from the neck down.  8.  DO NOT shower/wash with your normal soap after using and rinsing off the CHG Soap.            9.  Pat yourself dry with a clean towel.            10.  Wear clean pajamas.            11.  Place clean sheets on your bed the night of your first shower and do not  sleep with pets.  ON THE DAY OF SURGERY : Do not apply any lotions/deodorants the morning of surgery.  Please wear clean clothes to the hospital/surgery center.     FAILURE TO FOLLOW THESE INSTRUCTIONS MAY RESULT IN THE CANCELLATION OF YOUR SURGERY  PATIENT SIGNATURE_________________________________  NURSE SIGNATURE__________________________________  ________________________________________________________________________

## 2024-04-07 NOTE — Telephone Encounter (Signed)
 Bishop Bullock from [facility] is calling to know the exact time frame that we would want the patient to hold the Eliquis . Bishop Bullock is requesting to know the post operative instructions on when starting the Eliquis  for the patient. Bishop Bullock stated we can call her at either [phone] or [phone]. Please advise.

## 2024-04-07 NOTE — Progress Notes (Signed)
  Ms. Aragon perioperative risk of a major cardiac event is 0.9% according to the Revised Cardiac Risk Index (RCRI).  Therefore, she is at low risk for perioperative complications.   Her functional capacity is good at > 4 METs according to the Duke Activity Status Index (DASI). Recommendations: According to ACC/AHA guidelines, no further cardiovascular testing needed.  The patient may proceed to surgery at acceptable risk.   Antiplatelet and/or Anticoagulation Recommendations: Eliquis  (Apixaban ) can be held for   days prior to surgery.  Please resume post op when felt to be safe.    Will route note to clinical pharm D to comment on holding time for Eliquis  and then will route note to pre-op pool once I hear back and after I have done an addendum to this note.    Lasalle Pointer, NP

## 2024-04-07 NOTE — Telephone Encounter (Signed)
 Patient with diagnosis of atrial fibrillation on Eliquis  for anticoagulation.    Procedure:   Hiatal Hernia Repair   Date of Surgery:  Clearance 04/16/24     CHA2DS2-VASc Score = 5   This indicates a 7.2% annual risk of stroke. The patient's score is based upon: CHF History: 0 HTN History: 1 Diabetes History: 0 Stroke History: 0 Vascular Disease History: 1 Age Score: 2 Gender Score: 1    CrCl 86 Platelet count 350  Patient has not had an Afib/aflutter ablation within the last 3 months or DCCV within the last 30 days  Per office protocol, patient can hold Eliquis  for 2 days prior to procedure.   Patient will not need bridging with Lovenox  (enoxaparin ) around procedure.  **This guidance is not considered finalized until pre-operative APP has relayed final recommendations.**

## 2024-04-08 ENCOUNTER — Encounter (HOSPITAL_COMMUNITY)
Admission: RE | Admit: 2024-04-08 | Discharge: 2024-04-08 | Disposition: A | Discharge status: Home / Self Care | Source: Ambulatory Visit | Attending: General Surgery | Admitting: General Surgery

## 2024-04-08 ENCOUNTER — Other Ambulatory Visit: Payer: Self-pay

## 2024-04-08 ENCOUNTER — Encounter (HOSPITAL_COMMUNITY): Payer: Self-pay

## 2024-04-08 DIAGNOSIS — K219 Gastro-esophageal reflux disease without esophagitis: Secondary | ICD-10-CM | POA: Diagnosis not present

## 2024-04-08 DIAGNOSIS — G4733 Obstructive sleep apnea (adult) (pediatric): Secondary | ICD-10-CM | POA: Insufficient documentation

## 2024-04-08 DIAGNOSIS — I252 Old myocardial infarction: Secondary | ICD-10-CM | POA: Insufficient documentation

## 2024-04-08 DIAGNOSIS — I10 Essential (primary) hypertension: Secondary | ICD-10-CM | POA: Insufficient documentation

## 2024-04-08 DIAGNOSIS — I480 Paroxysmal atrial fibrillation: Secondary | ICD-10-CM | POA: Insufficient documentation

## 2024-04-08 DIAGNOSIS — E039 Hypothyroidism, unspecified: Secondary | ICD-10-CM | POA: Insufficient documentation

## 2024-04-08 DIAGNOSIS — I5181 Takotsubo syndrome: Secondary | ICD-10-CM | POA: Diagnosis not present

## 2024-04-08 DIAGNOSIS — Z9221 Personal history of antineoplastic chemotherapy: Secondary | ICD-10-CM | POA: Diagnosis not present

## 2024-04-08 DIAGNOSIS — Z01818 Encounter for other preprocedural examination: Secondary | ICD-10-CM | POA: Diagnosis present

## 2024-04-08 DIAGNOSIS — K449 Diaphragmatic hernia without obstruction or gangrene: Secondary | ICD-10-CM | POA: Insufficient documentation

## 2024-04-08 HISTORY — DX: Other complications of anesthesia, initial encounter: T88.59XA

## 2024-04-08 HISTORY — DX: Pneumonia, unspecified organism: J18.9

## 2024-04-08 HISTORY — DX: Anemia, unspecified: D64.9

## 2024-04-08 LAB — COMPREHENSIVE METABOLIC PANEL WITH GFR
ALT: 19 U/L (ref 0–44)
AST: 23 U/L (ref 15–41)
Albumin: 4 g/dL (ref 3.5–5.0)
Alkaline Phosphatase: 52 U/L (ref 38–126)
Anion gap: 10 (ref 5–15)
BUN: 23 mg/dL (ref 8–23)
CO2: 22 mmol/L (ref 22–32)
Calcium: 9.1 mg/dL (ref 8.9–10.3)
Chloride: 108 mmol/L (ref 98–111)
Creatinine, Ser: 0.86 mg/dL (ref 0.44–1.00)
GFR, Estimated: >60 mL/min (ref >60)
Glucose, Bld: 96 mg/dL (ref 70–99)
Potassium: 4.1 mmol/L (ref 3.5–5.1)
Sodium: 140 mmol/L (ref 135–145)
Total Bilirubin: 0.3 mg/dL (ref 0.0–1.2)
Total Protein: 7.5 g/dL (ref 6.5–8.1)

## 2024-04-08 LAB — CBC WITH DIFFERENTIAL/PLATELET
Abs Immature Granulocytes: 0.02 10*3/uL (ref 0.00–0.07)
Basophils Absolute: 0 10*3/uL (ref 0.0–0.1)
Basophils Relative: 1 %
Eosinophils Absolute: 0.2 10*3/uL (ref 0.0–0.5)
Eosinophils Relative: 3 %
HCT: 39.8 % (ref 36.0–46.0)
Hemoglobin: 12.5 g/dL (ref 12.0–15.0)
Immature Granulocytes: 0 %
Lymphocytes Relative: 42 %
Lymphs Abs: 2.6 10*3/uL (ref 0.7–4.0)
MCH: 28.5 pg (ref 26.0–34.0)
MCHC: 31.4 g/dL (ref 30.0–36.0)
MCV: 90.7 fL (ref 80.0–100.0)
Monocytes Absolute: 0.4 10*3/uL (ref 0.1–1.0)
Monocytes Relative: 7 %
Neutro Abs: 3 10*3/uL (ref 1.7–7.7)
Neutrophils Relative %: 47 %
Platelets: 317 10*3/uL (ref 150–400)
RBC: 4.39 MIL/uL (ref 3.87–5.11)
RDW: 13.8 % (ref 11.5–15.5)
WBC: 6.2 10*3/uL (ref 4.0–10.5)
nRBC: 0 % (ref 0.0–0.2)

## 2024-04-08 NOTE — Telephone Encounter (Signed)
 Ms. Kelp perioperative risk of a major cardiac event is 0.9% according to the Revised Cardiac Risk Index (RCRI).  Therefore, she is at low risk for perioperative complications.   Her functional capacity is good at > 4 METs according to the Duke Activity Status Index (DASI). Recommendations: According to ACC/AHA guidelines, no further cardiovascular testing needed.  The patient may proceed to surgery at acceptable risk.   Antiplatelet and/or Anticoagulation Recommendations: Per office protocol, patient can hold Eliquis  for 2 days prior to procedure.  Patient will not need bridging with Lovenox  (enoxaparin ) around procedure.     Will route note to requesting party. Please remove from pre-op pool.      Lasalle Pointer, NP

## 2024-04-09 NOTE — Progress Notes (Signed)
 Anesthesia Chart Review   Case: [id number] Date/Time: 04/16/24 0915   Procedures:      REPAIR, HERNIA, HIATAL, ROBOT-ASSISTED     ENDOSCOPY, UPPER GI TRACT   Anesthesia type: General   Diagnosis:      Gastroesophageal reflux disease without esophagitis [K21.9]     Hiatal hernia [K44.9]   Pre-op diagnosis: HIATAL HERNIA WITH GERD   Location: [facility] ROOM 02 / [facility]   Surgeons: Aldean Hummingbird, MD       DISCUSSION:78 y.o. 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 Aldean Hummingbird.   Per cardiology preoperative evaluation 04/08/2024, "Ms. Piechowski's perioperative risk of a major cardiac event is 0.9% according to the Revised Cardiac Risk Index (RCRI).  Therefore, she is at low risk for perioperative complications.   Her functional capacity is good at > 4 METs according to the Duke Activity Status Index (DASI). Recommendations: According to ACC/AHA guidelines, no further cardiovascular testing needed.  The patient may proceed to surgery at acceptable risk.   Antiplatelet and/or Anticoagulation Recommendations: Per office protocol, patient can hold Eliquis  for 2 days prior to procedure.  Patient will not need bridging with Lovenox  (enoxaparin ) around procedure."  VS: BP (!) 142/72 Comment: right arm sitting  Pulse 66   Temp 37.3 C (Oral)   Resp 16   Ht 5\' 5"  (1.651 m)   Wt 88.5 kg   SpO2 99%   BMI 32.45 kg/m   PROVIDERS: Ethelene Herald, MD is PCP   Cardiologist - Teddie Favre, MD   LABS: Labs reviewed: Acceptable for surgery. (all labs ordered are listed, but only abnormal results are displayed)  Labs Reviewed  CBC WITH DIFFERENTIAL/PLATELET  COMPREHENSIVE METABOLIC PANEL WITH GFR  TYPE AND SCREEN     IMAGES:   EKG:   CV: Myocardial Perfusion 05/14/2019 No diagnostic ST segment changes to indicate ischemia. Small,  mild intensity, partially reversible apical anteroseptal defect that is most consistent with variable soft tissue attenuation. No large ischemic territories noted. This is a low risk study. Nuclear stress EF: 71%.  Echo 03/27/2022  1. Left ventricular ejection fraction, by estimation, is 70 to 75%. The  left ventricle has hyperdynamic function. The left ventricle has no  regional wall motion abnormalities. Left ventricular diastolic parameters  are indeterminate. The average left  ventricular global longitudinal strain is -20.2 %. The global longitudinal  strain is normal.   2. Right ventricular systolic function is normal. The right ventricular  size is normal. Tricuspid regurgitation signal is inadequate for assessing  PA pressure.   3. The mitral valve is normal in structure. Mild mitral valve  regurgitation. No evidence of mitral stenosis.   4. The aortic valve is tricuspid. Aortic valve regurgitation is not  visualized. No aortic stenosis is present.   5. Pulmonic valve regurgitation is moderate.   6. The inferior vena cava is normal in size with greater than 50%  respiratory variability, suggesting right atrial pressure of 3 mmHg.  Past Medical History:  Diagnosis Date   ACE-inhibitor cough    Anemia    Aortic atherosclerosis (HCC)    Arthritis    Cancer (HCC)    breat   Closed right ankle fracture 2016   Collagen vascular disease (HCC)    Complication of anesthesia    slow to wake up, needs low doses   Dysrhythmia  GERD (gastroesophageal reflux disease)    Glaucoma    History of breast cancer 2005   Left   Hypertension    Hypothyroidism    Mixed hyperlipidemia    Myocardial infarction (HCC)    OSA (obstructive sleep apnea)    mild with AHI 7.8/hr on auto CPAP   Paroxysmal atrial fibrillation (HCC)    Pelvic prolapse    Personal history of chemotherapy 2005   Personal history of radiation therapy 2005   Pneumonia    Takotsubo cardiomyopathy 2008   cardiac cath  11/08. minimal coronary disease, anteroseptal akinesis with an EF 55%.  echo 2/09 EF 60-65% (LV function normalized)   Wears glasses     Past Surgical History:  Procedure Laterality Date   ANKLE FRACTURE SURGERY Right 05/2015   ANTERIOR AND POSTERIOR REPAIR WITH SACROSPINOUS FIXATION N/A 08/20/2019   Procedure: ANTERIOR AND POSTERIOR REPAIR WITH possible SACROSPINOUS FIXATION;  Surgeon: Thora Flint, MD;  Location: [facility] [place];  Service: Gynecology;  Laterality: N/A;  need bed   ATRIAL FIBRILLATION ABLATION N/A 09/21/2021   Procedure: ATRIAL FIBRILLATION ABLATION;  Surgeon: Jolly Needle, MD;  Location: [facility];  Service: Cardiovascular;  Laterality: N/A;   BACK SURGERY     BREAST BIOPSY  01/03/2012   BREAST LUMPECTOMY Left 2005   left breast   BUNIONECTOMY     left   CHOLECYSTECTOMY     laparoscopic   COLONOSCOPY     ESOPHAGOGASTRODUODENOSCOPY (EGD) WITH PROPOFOL  N/A 01/18/2023   Procedure: ESOPHAGOGASTRODUODENOSCOPY (EGD) WITH PROPOFOL ;  Surgeon: Alvis Jourdain, MD;  Location: [facility];  Service: Gastroenterology;  Laterality: N/A;   EYE SURGERY Bilateral    GANGLION CYST EXCISION     left   HOT HEMOSTASIS N/A 01/18/2023   Procedure: HOT HEMOSTASIS (ARGON PLASMA COAGULATION/BICAP);  Surgeon: Alvis Jourdain, MD;  Location: [facility];  Service: Gastroenterology;  Laterality: N/A;   PARTIAL KNEE ARTHROPLASTY Right 12/28/2016   Procedure: RIGHT UNICOMPARTMENTAL KNEE;  Surgeon: Saundra Curl, MD;  Location: [facility];  Service: Orthopedics;  Laterality: Right;   PARTIAL KNEE ARTHROPLASTY Left 09/23/2018   Procedure: UNICOMPARTMENTAL LEFT  KNEE;  Surgeon: Saundra Curl, MD;  Location: [facility];  Service: Orthopedics;  Laterality: Left;   POLYPECTOMY  01/18/2023   Procedure: POLYPECTOMY;  Surgeon: Alvis Jourdain, MD;  Location: [facility];  Service: Gastroenterology;;   TOTAL ABDOMINAL HYSTERECTOMY  1997   UPPER GI ENDOSCOPY       MEDICATIONS:  Omeprazole-Sodium Bicarbonate (ZEGERID) 20-1100 MG CAPS capsule   acetaminophen  (TYLENOL ) 650 MG CR tablet   amLODipine  (NORVASC ) 5 MG tablet   Biotin 29562 MCG TABS   Dorzolamide HCl-Timolol  Mal PF 2-0.5 % SOLN   ELIQUIS  5 MG TABS tablet   levothyroxine  (SYNTHROID ) 88 MCG tablet   meclizine  (ANTIVERT ) 25 MG tablet   metoprolol  tartrate (LOPRESSOR ) 25 MG tablet   Multiple Vitamins-Minerals (MEGAVITE FRUITS & VEGGIES PO)   OVER THE COUNTER MEDICATION   Polyethyl Glyc-Propyl Glyc PF (SYSTANE HYDRATION PF) 0.4-0.3 % SOLN   polyethylene glycol powder (GLYCOLAX /MIRALAX ) powder   simvastatin  (ZOCOR ) 20 MG tablet   No current facility-administered medications for this encounter.    Chick Cotton Ward, PA-C [facility] [phone]

## 2024-04-09 NOTE — Anesthesia Preprocedure Evaluation (Addendum)
 Anesthesia Evaluation  Patient identified by MRN, date of birth, ID band Patient awake    Reviewed: Allergy & Precautions, H&P , NPO status , Patient's Chart, lab work & pertinent test results  History of Anesthesia Complications Negative for: history of anesthetic complications  Airway Mallampati: II  TM Distance: >3 FB Neck ROM: Full    Dental no notable dental hx.    Pulmonary sleep apnea    Pulmonary exam normal breath sounds clear to auscultation       Cardiovascular hypertension, Normal cardiovascular exam+ dysrhythmias Atrial Fibrillation  Rhythm:Regular Rate:Normal  Hx of takotsubo cardiomyopathy   TTE 03/2022: IMPRESSIONS     1. Left ventricular ejection fraction, by estimation, is 70 to 75%. The  left ventricle has hyperdynamic function. The left ventricle has no  regional wall motion abnormalities. Left ventricular diastolic parameters  are indeterminate. The average left  ventricular global longitudinal strain is -20.2 %. The global longitudinal  strain is normal.   2. Right ventricular systolic function is normal. The right ventricular  size is normal. Tricuspid regurgitation signal is inadequate for assessing  PA pressure.   3. The mitral valve is normal in structure. Mild mitral valve  regurgitation. No evidence of mitral stenosis.   4. The aortic valve is tricuspid. Aortic valve regurgitation is not  visualized. No aortic stenosis is present.   5. Pulmonic valve regurgitation is moderate.   6. The inferior vena cava is normal in size with greater than 50%  respiratory variability, suggesting right atrial pressure of 3 mmHg     Neuro/Psych neg Seizures negative neurological ROS  negative psych ROS   GI/Hepatic Neg liver ROS,GERD  ,,Hiatal hernia   Endo/Other  Hypothyroidism    Renal/GU negative Renal ROS  negative genitourinary   Musculoskeletal  (+) Arthritis ,    Abdominal   Peds negative  pediatric ROS (+)  Hematology  (+) Blood dyscrasia, anemia   Anesthesia Other Findings   Reproductive/Obstetrics negative OB ROS                              Anesthesia Physical Anesthesia Plan  ASA: 3  Anesthesia Plan: General   Post-op Pain Management: Ofirmev  IV (intra-op)*   Induction: Intravenous  PONV Risk Score and Plan: 3 and Ondansetron , Dexamethasone  and Treatment may vary due to age or medical condition  Airway Management Planned: Oral ETT  Additional Equipment: None  Intra-op Plan:   Post-operative Plan: Extubation in OR  Informed Consent: I have reviewed the patients History and Physical, chart, labs and discussed the procedure including the risks, benefits and alternatives for the proposed anesthesia with the patient or authorized representative who has indicated his/her understanding and acceptance.     Dental advisory given  Plan Discussed with: CRNA  Anesthesia Plan Comments: (See PAT note 04/08/2024  78 y.o. 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 Aldean Hummingbird.    Per cardiology preoperative evaluation 04/08/2024, "Ms. Reiger's perioperative risk of a major cardiac event is 0.9% according to the Revised Cardiac Risk Index (RCRI).  Therefore, she is at low risk for perioperative complications.   Her functional capacity is good at > 4 METs according to the Duke Activity Status Index (DASI). Recommendations: According to ACC/AHA guidelines, no further cardiovascular testing needed.  The patient may proceed to  surgery at acceptable risk.   Antiplatelet and/or Anticoagulation Recommendations: Per office protocol, patient can hold Eliquis  for 2 days prior to procedure.  Patient will not need bridging with Lovenox  (enoxaparin ) around procedure." )        Anesthesia Quick  Evaluation

## 2024-04-16 ENCOUNTER — Ambulatory Visit (HOSPITAL_COMMUNITY)
Admission: RE | Admit: 2024-04-16 | Discharge: 2024-04-17 | Disposition: A | Discharge status: Home / Self Care | Source: Ambulatory Visit | Attending: General Surgery | Admitting: General Surgery

## 2024-04-16 ENCOUNTER — Encounter (HOSPITAL_COMMUNITY): Payer: Self-pay | Admitting: General Surgery

## 2024-04-16 ENCOUNTER — Ambulatory Visit (HOSPITAL_BASED_OUTPATIENT_CLINIC_OR_DEPARTMENT_OTHER)

## 2024-04-16 ENCOUNTER — Ambulatory Visit (HOSPITAL_COMMUNITY): Payer: Self-pay | Admitting: Physician Assistant

## 2024-04-16 ENCOUNTER — Other Ambulatory Visit: Payer: Self-pay

## 2024-04-16 ENCOUNTER — Encounter (HOSPITAL_COMMUNITY)
Admission: RE | Disposition: A | Discharge status: Home / Self Care | Payer: Self-pay | Source: Ambulatory Visit | Attending: General Surgery

## 2024-04-16 DIAGNOSIS — K219 Gastro-esophageal reflux disease without esophagitis: Secondary | ICD-10-CM | POA: Diagnosis present

## 2024-04-16 DIAGNOSIS — E66811 Obesity, class 1: Secondary | ICD-10-CM | POA: Diagnosis not present

## 2024-04-16 DIAGNOSIS — I10 Essential (primary) hypertension: Secondary | ICD-10-CM | POA: Insufficient documentation

## 2024-04-16 DIAGNOSIS — K449 Diaphragmatic hernia without obstruction or gangrene: Secondary | ICD-10-CM | POA: Insufficient documentation

## 2024-04-16 DIAGNOSIS — I480 Paroxysmal atrial fibrillation: Secondary | ICD-10-CM | POA: Insufficient documentation

## 2024-04-16 DIAGNOSIS — I371 Nonrheumatic pulmonary valve insufficiency: Secondary | ICD-10-CM | POA: Insufficient documentation

## 2024-04-16 DIAGNOSIS — G4733 Obstructive sleep apnea (adult) (pediatric): Secondary | ICD-10-CM

## 2024-04-16 DIAGNOSIS — Z8249 Family history of ischemic heart disease and other diseases of the circulatory system: Secondary | ICD-10-CM | POA: Insufficient documentation

## 2024-04-16 DIAGNOSIS — M1990 Unspecified osteoarthritis, unspecified site: Secondary | ICD-10-CM | POA: Insufficient documentation

## 2024-04-16 DIAGNOSIS — Z6832 Body mass index (BMI) 32.0-32.9, adult: Secondary | ICD-10-CM | POA: Insufficient documentation

## 2024-04-16 DIAGNOSIS — Z8719 Personal history of other diseases of the digestive system: Secondary | ICD-10-CM

## 2024-04-16 DIAGNOSIS — E039 Hypothyroidism, unspecified: Secondary | ICD-10-CM | POA: Diagnosis not present

## 2024-04-16 HISTORY — PX: UPPER GI ENDOSCOPY: SHX6162

## 2024-04-16 HISTORY — PX: XI ROBOTIC ASSISTED HIATAL HERNIA REPAIR: SHX6889

## 2024-04-16 LAB — TYPE AND SCREEN
ABO/RH(D): O POS
Antibody Screen: NEGATIVE

## 2024-04-16 SURGERY — REPAIR, HERNIA, HIATAL, ROBOT-ASSISTED
Anesthesia: General | Site: Mouth

## 2024-04-16 MED ORDER — LACTATED RINGERS IV SOLN: INTRAVENOUS | Status: DC

## 2024-04-16 MED ORDER — DEXAMETHASONE SODIUM PHOSPHATE 10 MG/ML IJ SOLN
INTRAMUSCULAR | Status: AC
  Filled 2024-04-16: qty 1

## 2024-04-16 MED ORDER — FENTANYL CITRATE PF 50 MCG/ML IJ SOSY
PREFILLED_SYRINGE | INTRAMUSCULAR | Status: AC
Start: 2024-04-16 — End: ?
  Filled 2024-04-16: qty 2

## 2024-04-16 MED ORDER — PROPOFOL 10 MG/ML IV BOLUS
INTRAVENOUS | Status: AC
  Filled 2024-04-16: qty 20

## 2024-04-16 MED ORDER — DORZOLAMIDE HCL-TIMOLOL MAL 2-0.5 % OP SOLN
1.0000 [drp] | Freq: Two times a day (BID) | OPHTHALMIC | Status: DC
  Filled 2024-04-16: qty 10

## 2024-04-16 MED ORDER — ACETAMINOPHEN 500 MG PO TABS
1000.0000 mg | ORAL_TABLET | Freq: Four times a day (QID) | ORAL | Status: DC
  Administered 2024-04-16 – 2024-04-17 (×4): 1000 mg via ORAL
  Filled 2024-04-16 (×4): qty 2

## 2024-04-16 MED ORDER — DIPHENHYDRAMINE HCL 12.5 MG/5ML PO ELIX: 12.5000 mg | ORAL_SOLUTION | Freq: Four times a day (QID) | ORAL | Status: DC | PRN

## 2024-04-16 MED ORDER — BUPIVACAINE-EPINEPHRINE 0.25% -1:200000 IJ SOLN
INTRAMUSCULAR | Status: DC | PRN
  Administered 2024-04-16: 30 mL

## 2024-04-16 MED ORDER — HYDROMORPHONE HCL 1 MG/ML IJ SOLN
INTRAMUSCULAR | Status: DC | PRN
  Administered 2024-04-16: .3 mg via INTRAVENOUS

## 2024-04-16 MED ORDER — ESMOLOL HCL 100 MG/10ML IV SOLN
INTRAVENOUS | Status: AC
  Filled 2024-04-16: qty 10

## 2024-04-16 MED ORDER — SUCRALFATE 1 GM/10ML PO SUSP: 1.0000 g | Freq: Four times a day (QID) | ORAL | Status: DC | PRN

## 2024-04-16 MED ORDER — PHENYLEPHRINE 80 MCG/ML (10ML) SYRINGE FOR IV PUSH (FOR BLOOD PRESSURE SUPPORT)
PREFILLED_SYRINGE | INTRAVENOUS | Status: DC | PRN
  Administered 2024-04-16 (×2): 160 ug via INTRAVENOUS

## 2024-04-16 MED ORDER — OXYCODONE HCL 5 MG/5ML PO SOLN: 5.0000 mg | Freq: Once | ORAL | Status: DC | PRN

## 2024-04-16 MED ORDER — SIMETHICONE 80 MG PO CHEW: 40.0000 mg | CHEWABLE_TABLET | Freq: Four times a day (QID) | ORAL | Status: DC | PRN

## 2024-04-16 MED ORDER — GLYCOPYRROLATE 0.2 MG/ML IJ SOLN
INTRAMUSCULAR | Status: DC | PRN
  Administered 2024-04-16: .2 mg via INTRAVENOUS

## 2024-04-16 MED ORDER — LIDOCAINE HCL (PF) 2 % IJ SOLN
INTRAMUSCULAR | Status: AC
  Filled 2024-04-16: qty 5

## 2024-04-16 MED ORDER — METOPROLOL TARTRATE 5 MG/5ML IV SOLN
INTRAVENOUS | Status: AC
  Filled 2024-04-16: qty 5

## 2024-04-16 MED ORDER — FENTANYL CITRATE (PF) 100 MCG/2ML IJ SOLN
INTRAMUSCULAR | Status: DC | PRN
  Administered 2024-04-16 (×2): 50 ug via INTRAVENOUS

## 2024-04-16 MED ORDER — CHLORHEXIDINE GLUCONATE 0.12 % MT SOLN: 15.0000 mL | Freq: Once | OROMUCOSAL | Status: DC

## 2024-04-16 MED ORDER — PHENYLEPHRINE HCL (PRESSORS) 10 MG/ML IV SOLN
INTRAVENOUS | Status: AC
  Filled 2024-04-16: qty 1

## 2024-04-16 MED ORDER — ONDANSETRON HCL 4 MG/2ML IJ SOLN: 4.0000 mg | Freq: Four times a day (QID) | INTRAMUSCULAR | Status: DC | PRN

## 2024-04-16 MED ORDER — LIDOCAINE HCL (CARDIAC) PF 100 MG/5ML IV SOSY
PREFILLED_SYRINGE | INTRAVENOUS | Status: DC | PRN
  Administered 2024-04-16: 100 mg via INTRAVENOUS

## 2024-04-16 MED ORDER — MELATONIN 3 MG PO TABS: 3.0000 mg | ORAL_TABLET | Freq: Every evening | ORAL | Status: DC | PRN

## 2024-04-16 MED ORDER — CHLORHEXIDINE GLUCONATE CLOTH 2 % EX PADS: 6.0000 | MEDICATED_PAD | Freq: Once | CUTANEOUS | Status: DC

## 2024-04-16 MED ORDER — LEVOTHYROXINE SODIUM 88 MCG PO TABS
88.0000 ug | ORAL_TABLET | Freq: Every day | ORAL | Status: DC
  Administered 2024-04-17: 88 ug via ORAL
  Filled 2024-04-16: qty 1

## 2024-04-16 MED ORDER — MORPHINE SULFATE (PF) 2 MG/ML IV SOLN: 1.0000 mg | INTRAVENOUS | Status: DC | PRN

## 2024-04-16 MED ORDER — PHENYLEPHRINE HCL-NACL 20-0.9 MG/250ML-% IV SOLN
INTRAVENOUS | Status: DC | PRN
  Administered 2024-04-16: 40 ug/min via INTRAVENOUS

## 2024-04-16 MED ORDER — FENTANYL CITRATE PF 50 MCG/ML IJ SOSY
25.0000 ug | PREFILLED_SYRINGE | INTRAMUSCULAR | Status: DC | PRN
  Administered 2024-04-16 (×3): 50 ug via INTRAVENOUS

## 2024-04-16 MED ORDER — ESMOLOL HCL 100 MG/10ML IV SOLN
INTRAVENOUS | Status: DC | PRN
  Administered 2024-04-16: 10 mg via INTRAVENOUS
  Administered 2024-04-16 (×2): 20 mg via INTRAVENOUS
  Administered 2024-04-16: 10 mg via INTRAVENOUS
  Administered 2024-04-16 (×2): 20 mg via INTRAVENOUS

## 2024-04-16 MED ORDER — MIDAZOLAM HCL 5 MG/5ML IJ SOLN
INTRAMUSCULAR | Status: DC | PRN
  Administered 2024-04-16 (×2): 1 mg via INTRAVENOUS

## 2024-04-16 MED ORDER — BUPIVACAINE-EPINEPHRINE (PF) 0.25% -1:200000 IJ SOLN
INTRAMUSCULAR | Status: AC
  Filled 2024-04-16: qty 30

## 2024-04-16 MED ORDER — ACETAMINOPHEN 500 MG PO TABS
1000.0000 mg | ORAL_TABLET | ORAL | Status: AC
  Administered 2024-04-16: 1000 mg via ORAL
  Filled 2024-04-16: qty 2

## 2024-04-16 MED ORDER — METOPROLOL TARTRATE 5 MG/5ML IV SOLN
INTRAVENOUS | Status: DC | PRN
  Administered 2024-04-16 (×5): 1 mg via INTRAVENOUS

## 2024-04-16 MED ORDER — FENTANYL CITRATE (PF) 100 MCG/2ML IJ SOLN
INTRAMUSCULAR | Status: AC
  Filled 2024-04-16: qty 2

## 2024-04-16 MED ORDER — PROPOFOL 10 MG/ML IV BOLUS
INTRAVENOUS | Status: DC | PRN
  Administered 2024-04-16: 100 mg via INTRAVENOUS

## 2024-04-16 MED ORDER — SODIUM CHLORIDE 0.9 % IV SOLN
12.5000 mg | Freq: Four times a day (QID) | INTRAVENOUS | Status: DC | PRN
Start: 2024-04-16 — End: 2024-04-17

## 2024-04-16 MED ORDER — PANTOPRAZOLE SODIUM 40 MG IV SOLR
40.0000 mg | Freq: Every day | INTRAVENOUS | Status: DC
  Administered 2024-04-16: 40 mg via INTRAVENOUS
  Filled 2024-04-16: qty 10

## 2024-04-16 MED ORDER — DEXAMETHASONE SODIUM PHOSPHATE 10 MG/ML IJ SOLN: 4.0000 mg | INTRAMUSCULAR | Status: DC

## 2024-04-16 MED ORDER — ONDANSETRON 4 MG PO TBDP: 4.0000 mg | ORAL_TABLET | Freq: Four times a day (QID) | ORAL | Status: DC | PRN

## 2024-04-16 MED ORDER — LACTATED RINGERS IR SOLN
Status: DC | PRN
  Administered 2024-04-16: 1000 mL

## 2024-04-16 MED ORDER — SUGAMMADEX SODIUM 200 MG/2ML IV SOLN
INTRAVENOUS | Status: DC | PRN
  Administered 2024-04-16 (×2): 100 mg via INTRAVENOUS

## 2024-04-16 MED ORDER — DROPERIDOL 2.5 MG/ML IJ SOLN: 0.6250 mg | Freq: Once | INTRAMUSCULAR | Status: DC | PRN

## 2024-04-16 MED ORDER — ENOXAPARIN SODIUM 40 MG/0.4ML IJ SOSY
40.0000 mg | PREFILLED_SYRINGE | INTRAMUSCULAR | Status: DC
  Administered 2024-04-17: 40 mg via SUBCUTANEOUS
  Filled 2024-04-16: qty 0.4

## 2024-04-16 MED ORDER — SCOPOLAMINE 1 MG/3DAYS TD PT72
1.0000 | MEDICATED_PATCH | TRANSDERMAL | Status: DC
  Filled 2024-04-16: qty 1

## 2024-04-16 MED ORDER — MECLIZINE HCL 25 MG PO TABS: 25.0000 mg | ORAL_TABLET | Freq: Three times a day (TID) | ORAL | Status: DC | PRN

## 2024-04-16 MED ORDER — ONDANSETRON HCL 4 MG/2ML IJ SOLN
INTRAMUSCULAR | Status: AC
  Filled 2024-04-16: qty 2

## 2024-04-16 MED ORDER — MIDAZOLAM HCL 2 MG/2ML IJ SOLN
INTRAMUSCULAR | Status: AC
  Filled 2024-04-16: qty 2

## 2024-04-16 MED ORDER — OXYCODONE HCL 5 MG PO TABS: 5.0000 mg | ORAL_TABLET | Freq: Once | ORAL | Status: DC | PRN

## 2024-04-16 MED ORDER — ROCURONIUM BROMIDE 10 MG/ML (PF) SYRINGE
PREFILLED_SYRINGE | INTRAVENOUS | Status: AC
  Filled 2024-04-16: qty 10

## 2024-04-16 MED ORDER — DORZOLAMIDE HCL-TIMOLOL MAL PF 2-0.5 % OP SOLN: 1.0000 [drp] | Freq: Two times a day (BID) | OPHTHALMIC | Status: DC

## 2024-04-16 MED ORDER — BUPIVACAINE LIPOSOME 1.3 % IJ SUSP
INTRAMUSCULAR | Status: AC
  Filled 2024-04-16: qty 20

## 2024-04-16 MED ORDER — ORAL CARE MOUTH RINSE: 15.0000 mL | Freq: Once | OROMUCOSAL | Status: DC

## 2024-04-16 MED ORDER — FENTANYL CITRATE PF 50 MCG/ML IJ SOSY
PREFILLED_SYRINGE | INTRAMUSCULAR | Status: AC
  Filled 2024-04-16: qty 1

## 2024-04-16 MED ORDER — ROCURONIUM BROMIDE 100 MG/10ML IV SOLN
INTRAVENOUS | Status: DC | PRN
  Administered 2024-04-16 (×2): 10 mg via INTRAVENOUS
  Administered 2024-04-16: 60 mg via INTRAVENOUS

## 2024-04-16 MED ORDER — CEFAZOLIN SODIUM-DEXTROSE 2-4 GM/100ML-% IV SOLN
2.0000 g | INTRAVENOUS | Status: AC
  Administered 2024-04-16: 2 g via INTRAVENOUS
  Filled 2024-04-16: qty 100

## 2024-04-16 MED ORDER — ACETAMINOPHEN 10 MG/ML IV SOLN
1000.0000 mg | Freq: Once | INTRAVENOUS | Status: DC | PRN
Start: 2024-04-16 — End: 2024-04-16

## 2024-04-16 MED ORDER — HYDROMORPHONE HCL 2 MG/ML IJ SOLN
INTRAMUSCULAR | Status: AC
  Filled 2024-04-16: qty 1

## 2024-04-16 MED ORDER — TRAMADOL HCL 50 MG PO TABS: 50.0000 mg | ORAL_TABLET | Freq: Four times a day (QID) | ORAL | Status: DC | PRN

## 2024-04-16 MED ORDER — METHOCARBAMOL 500 MG PO TABS: 500.0000 mg | ORAL_TABLET | Freq: Four times a day (QID) | ORAL | Status: DC | PRN

## 2024-04-16 MED ORDER — LACTATED RINGERS IV SOLN: INTRAVENOUS | Status: DC | PRN

## 2024-04-16 MED ORDER — 0.9 % SODIUM CHLORIDE (POUR BTL) OPTIME
TOPICAL | Status: DC | PRN
  Administered 2024-04-16: 1000 mL

## 2024-04-16 MED ORDER — KCL IN DEXTROSE-NACL 20-5-0.45 MEQ/L-%-% IV SOLN
INTRAVENOUS | Status: AC
  Filled 2024-04-16: qty 1000

## 2024-04-16 MED ORDER — AMLODIPINE BESYLATE 5 MG PO TABS
5.0000 mg | ORAL_TABLET | Freq: Every day | ORAL | Status: DC
  Administered 2024-04-17: 5 mg via ORAL
  Filled 2024-04-16: qty 1

## 2024-04-16 MED ORDER — HEPARIN SODIUM (PORCINE) 5000 UNIT/ML IJ SOLN
5000.0000 [IU] | Freq: Once | INTRAMUSCULAR | Status: AC
  Administered 2024-04-16: 5000 [IU] via SUBCUTANEOUS
  Filled 2024-04-16: qty 1

## 2024-04-16 MED ORDER — PHENYLEPHRINE HCL (PRESSORS) 10 MG/ML IV SOLN
INTRAVENOUS | Status: AC
Start: 2024-04-16 — End: ?
  Filled 2024-04-16: qty 1

## 2024-04-16 MED ORDER — DIPHENHYDRAMINE HCL 50 MG/ML IJ SOLN: 12.5000 mg | Freq: Four times a day (QID) | INTRAMUSCULAR | Status: DC | PRN

## 2024-04-16 MED ORDER — METOPROLOL TARTRATE 12.5 MG HALF TABLET
12.5000 mg | ORAL_TABLET | Freq: Two times a day (BID) | ORAL | Status: DC
  Administered 2024-04-16 – 2024-04-17 (×2): 12.5 mg via ORAL
  Filled 2024-04-16 (×2): qty 1

## 2024-04-16 SURGICAL SUPPLY — 60 items
BLADE SURG SZ11 CARB STEEL (BLADE) ×2 IMPLANT
CHLORAPREP W/TINT 26 (MISCELLANEOUS) ×2 IMPLANT
CLIP APPLIE 5 13 M/L LIGAMAX5 (MISCELLANEOUS) IMPLANT
CLIP APPLIE ROT 10 11.4 M/L (STAPLE) IMPLANT
COVER SURGICAL LIGHT HANDLE (MISCELLANEOUS) ×2 IMPLANT
COVER TIP SHEARS 8 DVNC (MISCELLANEOUS) IMPLANT
DERMABOND ADVANCED .7 DNX12 (GAUZE/BANDAGES/DRESSINGS) IMPLANT
DRAIN PENROSE .75X.25X12 SILI (WOUND CARE) IMPLANT
DRAIN PENROSE 0.5X18 (DRAIN) IMPLANT
DRAPE ARM DVNC X/XI (DISPOSABLE) ×8 IMPLANT
DRAPE COLUMN DVNC XI (DISPOSABLE) ×2 IMPLANT
DRIVER NDL LRG 8 DVNC XI (INSTRUMENTS) ×2 IMPLANT
DRIVER NDL MEGA SUTCUT DVNCXI (INSTRUMENTS) ×2 IMPLANT
DRIVER NDLE LRG 8 DVNC XI (INSTRUMENTS) IMPLANT
DRIVER NDLE MEGA SUTCUT DVNCXI (INSTRUMENTS) ×2 IMPLANT
DRSG TEGADERM 2-3/8X2-3/4 SM (GAUZE/BANDAGES/DRESSINGS) ×12 IMPLANT
ELECT REM PT RETURN 15FT ADLT (MISCELLANEOUS) ×2 IMPLANT
FORCEPS CADIERE DVNC XI (FORCEP) IMPLANT
GAUZE 4X4 16PLY ~~LOC~~+RFID DBL (SPONGE) ×2 IMPLANT
GAUZE SPONGE 2X2 8PLY STRL LF (GAUZE/BANDAGES/DRESSINGS) ×2 IMPLANT
GLOVE BIO SURGEON STRL SZ7.5 (GLOVE) ×4 IMPLANT
GLOVE INDICATOR 8.0 STRL GRN (GLOVE) ×4 IMPLANT
GOWN STRL REUS W/ TWL XL LVL3 (GOWN DISPOSABLE) ×4 IMPLANT
GRASPER SUT TROCAR 14GX15 (MISCELLANEOUS) IMPLANT
GRASPER TIP-UP FEN DVNC XI (INSTRUMENTS) ×2 IMPLANT
IRRIGATION SUCT STRKRFLW 2 WTP (MISCELLANEOUS) ×2 IMPLANT
KIT BASIN OR (CUSTOM PROCEDURE TRAY) ×2 IMPLANT
KIT GASTRIC LAVAGE 34FR ADT (SET/KITS/TRAYS/PACK) IMPLANT
KIT TURNOVER KIT A (KITS) IMPLANT
LUBRICANT JELLY K Y 4OZ (MISCELLANEOUS) IMPLANT
MARKER SKIN DUAL TIP RULER LAB (MISCELLANEOUS) IMPLANT
NDL HYPO 22X1.5 SAFETY MO (MISCELLANEOUS) ×2 IMPLANT
NEEDLE HYPO 22X1.5 SAFETY MO (MISCELLANEOUS) ×2 IMPLANT
OBTURATOR OPTICALSTD 8 DVNC (TROCAR) ×2 IMPLANT
PACK CARDIOVASCULAR III (CUSTOM PROCEDURE TRAY) ×2 IMPLANT
PAD POSITIONING PINK XL (MISCELLANEOUS) ×2 IMPLANT
SCISSORS LAP 5X35 DISP (ENDOMECHANICALS) IMPLANT
SCISSORS MNPLR CVD DVNC XI (INSTRUMENTS) ×2 IMPLANT
SEAL UNIV 5-12 XI (MISCELLANEOUS) ×6 IMPLANT
SEALER VESSEL EXT DVNC XI (MISCELLANEOUS) ×2 IMPLANT
SOLUTION ANTFG W/FOAM PAD STRL (MISCELLANEOUS) ×2 IMPLANT
SOLUTION ELECTROSURG ANTI STCK (MISCELLANEOUS) ×2 IMPLANT
SPIKE FLUID TRANSFER (MISCELLANEOUS) ×2 IMPLANT
STRIP CLOSURE SKIN 1/2X4 (GAUZE/BANDAGES/DRESSINGS) ×2 IMPLANT
SUT ETHIBOND 0 36 GRN (SUTURE) ×6 IMPLANT
SUT MNCRL AB 4-0 PS2 18 (SUTURE) ×2 IMPLANT
SUT SILK 0 SH 30 (SUTURE) IMPLANT
SUT SILK 2 0 SH (SUTURE) IMPLANT
SUT VIC AB 0 UR5 27 (SUTURE) IMPLANT
SYR 20ML ECCENTRIC (SYRINGE) ×2 IMPLANT
SYR 20ML LL LF (SYRINGE) ×2 IMPLANT
TIP INNERVISION DETACH 40FR (MISCELLANEOUS) IMPLANT
TIP INNERVISION DETACH 50FR (MISCELLANEOUS) IMPLANT
TIP INNERVISION DETACH 56FR (MISCELLANEOUS) IMPLANT
TOWEL OR 17X26 10 PK STRL BLUE (TOWEL DISPOSABLE) ×2 IMPLANT
TRAY FOLEY MTR SLVR 16FR STAT (SET/KITS/TRAYS/PACK) IMPLANT
TROCAR ADV FIXATION 12X100MM (TROCAR) IMPLANT
TROCAR ADV FIXATION 5X100MM (TROCAR) IMPLANT
TROCAR XCEL NON-BLD 5MMX100MML (ENDOMECHANICALS) ×2 IMPLANT
TUBING INSUFFLATION 10FT LAP (TUBING) ×2 IMPLANT

## 2024-04-16 NOTE — Anesthesia Procedure Notes (Signed)
 Procedure Name: Intubation Date/Time: 04/16/2024 10:15 AM  Performed by: Norvell Beers, CRNAPre-anesthesia Checklist: Patient identified, Emergency Drugs available, Suction available and Patient being monitored Patient Re-evaluated:Patient Re-evaluated prior to induction Oxygen Delivery Method: Circle system utilized Preoxygenation: Pre-oxygenation with 100% oxygen Induction Type: IV induction Ventilation: Mask ventilation without difficulty Laryngoscope Size: Mac and 3 Grade View: Grade I Tube type: Oral Tube size: 7.5 mm Number of attempts: 1 Airway Equipment and Method: Stylet Placement Confirmation: ETT inserted through vocal cords under direct vision, positive ETCO2 and breath sounds checked- equal and bilateral Secured at: 22 cm Tube secured with: Tape Dental Injury: Teeth and Oropharynx as per pre-operative assessment  Comments: Cords clear, atraumatic.

## 2024-04-16 NOTE — Op Note (Signed)
 PRE-OPERATIVE DIAGNOSIS:  LARGE HIATAL HERNIA   POST-OPERATIVE DIAGNOSIS:  MODERATE TYPE III HIATAL HERNIA   PROCEDURE:  Procedure(s): XI ROBOTIC ASSISTED  TYPE III HIATAL HERNIA REPAIR  WITH PARTIAL FUNDOPLICATION AND BILATERAL TAP BLOCK ESOPHAGOGASTRODUODENOSCOPY (EGD)   SURGEON:  Surgeon(s): Aldean Hummingbird, MD     ASSISTANTS: Harman Lightning MD a qualified assistant was needed and requested to help with tissue retraction and manipulation due to the complexity of the anatomy   ANESTHESIA:   general   DRAINS: none   EBL: minimal   LOCAL MEDICATIONS USED:  MARCAINE     SPECIMEN:  Source of Specimen:  HERNIA SAC& perigastric fat   DISPOSITION OF SPECIMEN:   DISCARDED   COUNTS:  YES   INDICATION FOR PROCEDURE: 78yo female was referred for management of a large type III hiatal hernia.   We had an extensive conversation regarding surgical repair.  We discussed potentially not being able to do a fundoplication if we were unable to achieve adequate intra-abdominal esophageal length.  Patient had preoperative upper endoscopy, upper GI, and esophageal manometry.  We also discussed potential mesh insertion.  Please see chart for additional details   PROCEDURE: Patient received 5000 units of subcutaneous heparin  preoperatively.  Patient was given Tylenol  and Decadron  and Zofran  preoperatively.  After informed consent was obtained patient was taken to the operating room at [facility].  They were placed upon on the operating room table.  Foley catheter was placed.  General endotracheal anesthesia was established.  Sequential compression devices were placed.  Usual standard surgical fashion with ChloraPrep.  The patient was on IV antibiotic prior to skin incision.  A surgical timeout was performed.   Access to the abdomen was obtained using the Optiview technique in the left mid abdomen about 8 cm to the left of the umbilicus in the midclavicular line.  Using a 0 degree 5 mm laparoscope  through a 5 mm trocar it was advanced through all layers of the abdominal wall and the abdominal cavity was carefully entered.  Pneumoperitoneum was smoothly established without any change in patient vital signs.  The laparoscope was advanced and the abdominal cavity was surveilled.  There was no evidence of injury.  Patient was placed in reverse Trendelenburg.  A robotic 8 trocar was placed just above to the umbilicus.  An additional 8 mm robotic trocar was placed in the right mid abdomen and a assistant 5 mm trocar was placed in the right lateral abdominal wall.  We exchanged the Optiview trocar for a robotic 8 trocar and a final 8 mm robotic trocar was placed in the left lateral abdominal wall.  A bilateral laparoscopic tap block was performed for postoperative pain relief.  A Nathanson liver retractor was placed through a subxiphoid small incision to lift up the left lobe of the liver providing excellent visualization of the diaphragm.  The [employer] robot was then docked to the trocars.  A tip up grasper in arm 4, vessel sealer in arm 3, camera in arm 2 and a fenestrated bipolar in arm 1.  At this time and went to the surgeon console while my assistant stayed at the bedside.  An assistant was needed to help with tissue retraction and manipulation given the complexity of the case.  The patient had a moderate type III hiatal hernia about one fourth of her stomach was herniated.  The aperture opening at the hiatus was probably around 4 cm.  The GE junction and the proximal stomach  was above the diaphragm.  Approximately one fourth of the stomach was above the diaphragm.      The gastrohepatic ligament was opened beginning at the pars flaccida. It was divided superiorly, taking care to avoid injury to the vagal branches and there was no aberrant left hepatic present. The right phrenoesophageal membrane was then incised to expose the right crural fibers underneath it.  I was able to peel and get into the  space of the hernia sac. We identified the plane between the hernia sac and the peritoneum along the right crus.  This was again taken down in sequential fashion using the vessel sealer.  Then with some gentle blunt dissection reduced some of the right sided hernia sac along with some of the herniated stomach.   I continued this toward the anterior diaphragm.  I then turned my attention and started taking the hernia sac down along the left anterior diaphragm toward the left crus.  We then decided to mobilize and take down some of the short gastric vessels off the proximal greater curvature of the stomach.  These were taken down in sequential fashion all the way up to the cardia and to the left crus.  We then turned our attention posteriorly to the junction of the left and right crus posteriorly.  I was able then to pass a Penrose drain around the retroesophageal area.  My assistant held this as I continued and started circumferential mobilization of the proximal stomach and esophagus in the mediastinum.  The aorta was visualized.  I did not see a right vagus nerve.  We did visualize a left vagus nerve.  The left anterior plane in the mediastinum was a little bit more adhered or adhesed in this area.  However we were able to identify the esophagus easily but did take our time in this area since this plane was a little bit more adhered.  We continue to circumstantial mobilization posteriorly, anteriorly, left and right around the esophagus in the mediastinum. The pleura was not violated.  Again did not visualize the right vagus but identified and preserve the left vagus.   We had achieved about 3 cm of intra-abdominal esophageal length.  This time I debrided some of the hernia sac around the GE junction with the vessel sealer ensuring that we were not on the stomach or on the esophagus.  We reduced intra-abdominal pressure to 10 mmHg.  I then reapproximated the left and right crura with 4 interrupted 0 Ethibond  sutures without tension.  We had achieved about 3 cm of intra-abdominal esophageal length I felt we could do a partial fundoplication.  Given the patient had normal manometry preoperatively given her age I felt a partial fundoplication would be safest to minimize postoperative issues such as dysphagia.  The posterior fundus was passed behind the esophagus and lifted anteriorly against the right side of the esophagus.  A suture of 0 Ethibond was placed on the right side incorporating the posterior fundus and a partial-thickness bite of the esophagus.  2 additional sutures were placed between the esophagus and the posterior fundus creating the right side of the fundoplication.  A posterior stitch was placed to fixate the wrap to where it laid naturally against the crural closure.  I then created the left side of the fundoplication.  The anterior fundus was pulled laterally to remove redundant stomach from behind the esophagus.  A stitch was placed between the anterior fundus and a partial bite of the esophagus.  2  additional sutures were placed between the esophagus and the posterior fundus.  The wrap appeared to be in the correct orientation-almost like a hot dog sitting in a hot dog bun. I also placed an anchoring suture between the left side of the wrap to the left side of the diaphragm.  Sutures were removed from the abdomen along with perigastric fat and hernia sac which were discarded along with a Penrose.  I then went to the head of the bed and obtained the Olympus endoscope and gently placed in the patient's oropharynx and guided it down her esophagus under direct endoscopic visualization.  There was no evidence of esophageal injury.  There was a little bit of irritation of the mucosa in the distal esophagus but no evidence of esophageal injury.  The endoscope was advanced into the stomach and the stomach was insufflated and I retroflexed to visualize the wrap endoscopically.  There was evidence of  a Hill grade valve 1.  The wrap appeared to be 270 degrees.  The stomach was desufflated and the endoscope was removed.  I then scrubbed back in.  Liver retractor was removed.  There is no evidence of injury to the left lobe of the liver.  The robot was undocked.  I scrubbed back in.  Trocars removed and the skin incisions were closed with a 4 Monocryl followed by the application of Dermabond.  All needle, instrument, and sponge counts were correct x 2.  There were no immediate complications.  Patient tolerated suture well.  Patient was extubated and taken recovery room in stable condition   PLAN OF CARE: Admit for overnight observation   PATIENT DISPOSITION:  PACU - hemodynamically stable.   Delay start of Pharmacological VTE agent (>24hrs) due to surgical blood loss or risk of bleeding:  no   Marianna Shirk. Elvan Hamel, MD, FACS General, Bariatric, & Minimally Invasive Surgery [facility],  [facility]

## 2024-04-16 NOTE — Anesthesia Postprocedure Evaluation (Signed)
 Anesthesia Post Note  Patient: Julie Jefferson  Procedure(s) Performed: REPAIR, HERNIA, HIATAL, ROBOT-ASSISTED WITH PARTIAL FUNDOPLICATION (Abdomen) ENDOSCOPY, UPPER GI TRACT (Mouth)     Patient location during evaluation: PACU Anesthesia Type: General Level of consciousness: awake and alert Pain management: pain level controlled Vital Signs Assessment: post-procedure vital signs reviewed and stable Respiratory status: spontaneous breathing, nonlabored ventilation, respiratory function stable and patient connected to nasal cannula oxygen Cardiovascular status: blood pressure returned to baseline and stable Postop Assessment: no apparent nausea or vomiting Anesthetic complications: no   No notable events documented.  Last Vitals:  Vitals:   04/16/24 1330 04/16/24 1345  BP: 122/79 135/83  Pulse: 67 67  Resp: 15 14  Temp:  (!) 36.4 C  SpO2: 100% 99%    Last Pain:  Vitals:   04/16/24 1345  TempSrc:   PainSc: Asleep                 Lethaniel Rave

## 2024-04-16 NOTE — Transfer of Care (Signed)
 Immediate Anesthesia Transfer of Care Note  Patient: Julie Jefferson  Procedure(s) Performed: REPAIR, HERNIA, HIATAL, ROBOT-ASSISTED WITH PARTIAL FUNDOPLICATION (Abdomen) ENDOSCOPY, UPPER GI TRACT (Mouth)  Patient Location: PACU  Anesthesia Type:General  Level of Consciousness: awake, alert , and oriented  Airway & Oxygen Therapy: Patient Spontanous Breathing and Patient connected to face mask oxygen  Post-op Assessment: Report given to RN and Post -op Vital signs reviewed and stable  Post vital signs: Reviewed and stable  Last Vitals:  Vitals Value Taken Time  BP 131/78 04/16/24 1243  Temp 36.4 C 04/16/24 1243  Pulse 65 04/16/24 1244  Resp 19 04/16/24 1244  SpO2 100 % 04/16/24 1244  Vitals shown include unfiled device data.  Last Pain:  Vitals:   04/16/24 0755  TempSrc:   PainSc: 0-No pain      Patients Stated Pain Goal: 4 (04/16/24 0755)  Complications: No notable events documented.

## 2024-04-16 NOTE — H&P (Signed)
 CC: here for surgery  Requesting provider: Dr Nickey Barn  HPI: Julie Jefferson is an 78 y.o. female who is here for robotic assisted repair of hiatal hernia with possible fundoplication, possible mesh, upper endoscopy.  Since I saw her in clinic she underwent esophageal manometry at [facility].  She stopped taking her Eliquis  on Sunday night.  She denies any medical changes since I last saw her.  She states her indigestion has probably gotten a little bit worse since I saw her in the clinic.  She also has early satiety as well.  Otherwise no changes or trips to the emergency room or hospital   Old hpi: The patient is a 78 year old with a hiatal hernia who presents with worsening symptoms of indigestion and reflux. She was referred by Dr. Nickey Barn for evaluation of her hiatal hernia.  She has been experiencing progressively worsening symptoms related to her hiatal hernia. Before Christmas, she had severe nausea and was unable to eat, leading to significant weight loss. She discontinued omeprazole, which she was taking at 80 mg daily, due to nausea, but this worsened her heartburn and reflux. She has been using Tums and Pepto Bismol frequently, consuming about 10 to 12 Tums daily.  She was started on Zegerid, which contains 10 mg of medication and bicarbonate, and reports that it has helped with both nausea and heartburn. Despite this, she continues to experience a burning sensation in her throat and regurgitation, especially if she eats after 4 PM.  She experiences a sensation of food getting stuck, particularly with solids, a couple of times a week, and occasionally feels choked on liquids. No pain with swallowing, but there is an uncomfortable sensation when food feels stuck. She also reports an acid taste in her mouth and regurgitation at night if she eats late.  She has lost 25 pounds unintentionally, which she attributes to her inability to eat large amounts of food and the need to avoid eating late in the  day.  Socially, she is a widow living alone with her dog. She has two children, one of whom lives nearby and checks on her regularly. She does not smoke and enjoys working in her yard, though she limits her activity to avoid overexertion.   Past Medical History:  Diagnosis Date   ACE-inhibitor cough    Anemia    Aortic atherosclerosis (HCC)    Arthritis    Cancer (HCC)    breat   Closed right ankle fracture 2016   Collagen vascular disease (HCC)    Complication of anesthesia    slow to wake up, needs low doses   Dysrhythmia    GERD (gastroesophageal reflux disease)    Glaucoma    History of breast cancer 2005   Left   Hypertension    Hypothyroidism    Mixed hyperlipidemia    Myocardial infarction (HCC)    OSA (obstructive sleep apnea)    mild with AHI 7.8/hr on auto CPAP   Paroxysmal atrial fibrillation (HCC)    Pelvic prolapse    Personal history of chemotherapy 2005   Personal history of radiation therapy 2005   Pneumonia    Takotsubo cardiomyopathy 2008   cardiac cath 11/08. minimal coronary disease, anteroseptal akinesis with an EF 55%.  echo 2/09 EF 60-65% (LV function normalized)   Wears glasses     Past Surgical History:  Procedure Laterality Date   ANKLE FRACTURE SURGERY Right 05/2015   ANTERIOR AND POSTERIOR REPAIR WITH SACROSPINOUS FIXATION N/A 08/20/2019  Procedure: ANTERIOR AND POSTERIOR REPAIR WITH possible SACROSPINOUS FIXATION;  Surgeon: Thora Flint, MD;  [facility];  Service: Gynecology;  Laterality: N/A;  need bed   ATRIAL FIBRILLATION ABLATION N/A 09/21/2021   Procedure: ATRIAL FIBRILLATION ABLATION;  Surgeon: Jolly Needle, MD;  Location: [facility];  Service: Cardiovascular;  Laterality: N/A;   BACK SURGERY     BREAST BIOPSY  01/03/2012   BREAST LUMPECTOMY Left 2005   left breast   BUNIONECTOMY     left   CHOLECYSTECTOMY     laparoscopic   COLONOSCOPY     ESOPHAGOGASTRODUODENOSCOPY (EGD) WITH PROPOFOL  N/A  01/18/2023   Procedure: ESOPHAGOGASTRODUODENOSCOPY (EGD) WITH PROPOFOL ;  Surgeon: Alvis Jourdain, MD;  Location: [facility];  Service: Gastroenterology;  Laterality: N/A;   EYE SURGERY Bilateral    GANGLION CYST EXCISION     left   HOT HEMOSTASIS N/A 01/18/2023   Procedure: HOT HEMOSTASIS (ARGON PLASMA COAGULATION/BICAP);  Surgeon: Alvis Jourdain, MD;  Location: [facility];  Service: Gastroenterology;  Laterality: N/A;   PARTIAL KNEE ARTHROPLASTY Right 12/28/2016   Procedure: RIGHT UNICOMPARTMENTAL KNEE;  Surgeon: Saundra Curl, MD;  Location: [facility];  Service: Orthopedics;  Laterality: Right;   PARTIAL KNEE ARTHROPLASTY Left 09/23/2018   Procedure: UNICOMPARTMENTAL LEFT  KNEE;  Surgeon: Saundra Curl, MD;  Location: [facility];  Service: Orthopedics;  Laterality: Left;   POLYPECTOMY  01/18/2023   Procedure: POLYPECTOMY;  Surgeon: Alvis Jourdain, MD;  Location: [facility];  Service: Gastroenterology;;   TOTAL ABDOMINAL HYSTERECTOMY  1997   UPPER GI ENDOSCOPY      Family History  Problem Relation Age of Onset   Cancer Mother 40       breast   Breast cancer Mother    Heart disease Father    Coronary artery disease Other        family hx   Cancer Sister        LCIS 85   Breast cancer Sister    Cancer Maternal Aunt 73       breast   Breast cancer Maternal Aunt     Social:  reports that she has never smoked. She has never used smokeless tobacco. She reports that she does not drink alcohol and does not use drugs.  Allergies:  Allergies  Allergen Reactions   Alphagan [Brimonidine] Other (See Comments)    Dries eyes out.    Dorzolamide Hcl Other (See Comments)    Dried eyes out. (Intolerance to the preservative in the eye drop--can tolerate PF formulation)   Penicillins Other (See Comments)    Has patient had a PCN reaction causing immediate rash, facial/tongue/throat swelling, SOB or lightheadedness with hypotension: Unknown childhood reaction Has patient  had a PCN reaction causing severe rash involving mucus membranes or skin necrosis: No Has patient had a PCN reaction that required hospitalization No Has patient had a PCN reaction occurring within the last 10 years: No If all of the above answers are "NO", then may proceed with Cephalosporin use.    Latex Rash    Medications: I have reviewed the patient's current medications.   ROS - all of the below systems have been reviewed with the patient and positives are indicated with bold text General: chills, fever or night sweats Eyes: blurry vision or double vision ENT: epistaxis or sore throat Allergy/Immunology: itchy/watery eyes or nasal congestion Hematologic/Lymphatic: bleeding problems, blood clots or swollen lymph nodes Endocrine: temperature intolerance or unexpected weight changes Breast: new or  changing breast lumps or nipple discharge Resp: cough, shortness of breath, or wheezing CV: chest pain or dyspnea on exertion GI: as per HPI GU: dysuria, trouble voiding, or hematuria MSK: joint pain or joint stiffness Neuro: TIA or stroke symptoms Derm: pruritus and skin lesion changes Psych: anxiety and depression  PE Blood pressure 102/80, pulse (!) 128, temperature 97.6 F (36.4 C), temperature source Oral, resp. rate 18, height 5\' 5"  (1.651 m), weight 88.5 kg, SpO2 99%. Constitutional: NAD; conversant; no deformities Eyes: Moist conjunctiva; no lid lag; anicteric; PERRL Neck: Trachea midline; no thyromegaly Lungs: Normal respiratory effort; no tactile fremitus CV: RRR; no palpable thrills; no pitting edema GI: Abd soft, nt, nd; no palpable hepatosplenomegaly MSK: Normal gait; no clubbing/cyanosis Psychiatric: Appropriate affect; alert and oriented x3 Lymphatic: No palpable cervical or axillary lymphadenopathy Skin:no rash/lesions  No results found for this or any previous visit (from the past 48 hours).  No results found.  Imaging: Upper GI March 28 IMPRESSION: Small  to moderate hiatal hernia.  A/P: ROSSLYNN TENNIS is an 78 y.o. female with  Hiatal hernia  Essential hypertension  Paroxysmal atrial fibrillation (CMS/HHS-HCC)  OSA (obstructive sleep apnea)  Gastroesophageal reflux disease without esophagitis  Obesity, Class I, BMI 30-34.9  Antiplatelet or antithrombotic long-term use    To operating room for robotic assisted repair of hiatal hernia with possible fundoplication, possible mesh, upper endoscopy,  Enhanced recovery protocol IV antibiotic Sequential compression devices Preoperative subcu heparin  We rediscussed typical hospitalization  All questions asked and answered  Marianna Shirk. Elvan Hamel, MD, FACS General, Bariatric, & Minimally Invasive Surgery [facility]

## 2024-04-17 ENCOUNTER — Ambulatory Visit (HOSPITAL_COMMUNITY)

## 2024-04-17 ENCOUNTER — Other Ambulatory Visit (HOSPITAL_COMMUNITY): Payer: Self-pay

## 2024-04-17 ENCOUNTER — Encounter (HOSPITAL_COMMUNITY): Payer: Self-pay | Admitting: General Surgery

## 2024-04-17 DIAGNOSIS — K449 Diaphragmatic hernia without obstruction or gangrene: Secondary | ICD-10-CM | POA: Diagnosis not present

## 2024-04-17 LAB — BASIC METABOLIC PANEL WITH GFR
Anion gap: 6 (ref 5–15)
BUN: 19 mg/dL (ref 8–23)
CO2: 25 mmol/L (ref 22–32)
Calcium: 8.6 mg/dL — ABNORMAL LOW (ref 8.9–10.3)
Chloride: 101 mmol/L (ref 98–111)
Creatinine, Ser: 0.92 mg/dL (ref 0.44–1.00)
GFR, Estimated: >60 mL/min (ref >60)
Glucose, Bld: 139 mg/dL — ABNORMAL HIGH (ref 70–99)
Potassium: 4.1 mmol/L (ref 3.5–5.1)
Sodium: 132 mmol/L — ABNORMAL LOW (ref 135–145)

## 2024-04-17 LAB — CBC
HCT: 37 % (ref 36.0–46.0)
Hemoglobin: 12 g/dL (ref 12.0–15.0)
MCH: 28.9 pg (ref 26.0–34.0)
MCHC: 32.4 g/dL (ref 30.0–36.0)
MCV: 89.2 fL (ref 80.0–100.0)
Platelets: 299 10*3/uL (ref 150–400)
RBC: 4.15 MIL/uL (ref 3.87–5.11)
RDW: 14 % (ref 11.5–15.5)
WBC: 11 10*3/uL — ABNORMAL HIGH (ref 4.0–10.5)
nRBC: 0 % (ref 0.0–0.2)

## 2024-04-17 LAB — MAGNESIUM: Magnesium: 1.9 mg/dL (ref 1.7–2.4)

## 2024-04-17 MED ORDER — ACETAMINOPHEN 500 MG PO TABS
1000.0000 mg | ORAL_TABLET | Freq: Three times a day (TID) | ORAL | Status: AC
Start: 2024-04-17 — End: 2024-04-22

## 2024-04-17 MED ORDER — ENSURE ENLIVE PO LIQD
237.0000 mL | Freq: Two times a day (BID) | ORAL | Status: DC
  Administered 2024-04-17: 237 mL via ORAL

## 2024-04-17 MED ORDER — ONDANSETRON HCL 4 MG PO TABS
4.0000 mg | ORAL_TABLET | Freq: Every day | ORAL | 1 refills | Status: AC | PRN
  Filled 2024-04-17: qty 30, 30d supply, fill #0

## 2024-04-17 MED ORDER — TRAMADOL HCL 50 MG PO TABS
50.0000 mg | ORAL_TABLET | Freq: Four times a day (QID) | ORAL | 0 refills | Status: AC | PRN
  Filled 2024-04-17: qty 10, 3d supply, fill #0

## 2024-04-17 NOTE — Plan of Care (Signed)
 Patient remains alert and oriented, verbal of needs. Pain managed with oral medication. IV remains in place and patent. Pt declined night time dose of eye drops, stated that she was allergic to the formulation. SCD's applied, call light and belongings within reach. Bed at lowest position.  Problem: Education: Goal: Knowledge of General Education information will improve Description: Including pain rating scale, medication(s)/side effects and non-pharmacologic comfort measures Outcome: Progressing   Problem: Health Behavior/Discharge Planning: Goal: Ability to manage health-related needs will improve Outcome: Progressing   Problem: Clinical Measurements: Goal: Ability to maintain clinical measurements within normal limits will improve Outcome: Progressing Goal: Will remain free from infection Outcome: Progressing Goal: Diagnostic test results will improve Outcome: Progressing Goal: Respiratory complications will improve Outcome: Progressing Goal: Cardiovascular complication will be avoided Outcome: Progressing   Problem: Activity: Goal: Risk for activity intolerance will decrease Outcome: Progressing   Problem: Nutrition: Goal: Adequate nutrition will be maintained Outcome: Progressing   Problem: Coping: Goal: Level of anxiety will decrease Outcome: Progressing   Problem: Elimination: Goal: Will not experience complications related to bowel motility Outcome: Progressing Goal: Will not experience complications related to urinary retention Outcome: Progressing   Problem: Pain Managment: Goal: General experience of comfort will improve and/or be controlled Outcome: Progressing   Problem: Safety: Goal: Ability to remain free from injury will improve Outcome: Progressing   Problem: Skin Integrity: Goal: Risk for impaired skin integrity will decrease Outcome: Progressing   Problem: Education: Goal: Knowledge of General Education information will improve Description:  Including pain rating scale, medication(s)/side effects and non-pharmacologic comfort measures Outcome: Progressing   Problem: Health Behavior/Discharge Planning: Goal: Ability to manage health-related needs will improve Outcome: Progressing   Problem: Clinical Measurements: Goal: Ability to maintain clinical measurements within normal limits will improve Outcome: Progressing Goal: Will remain free from infection Outcome: Progressing Goal: Diagnostic test results will improve Outcome: Progressing Goal: Respiratory complications will improve Outcome: Progressing Goal: Cardiovascular complication will be avoided Outcome: Progressing   Problem: Activity: Goal: Risk for activity intolerance will decrease Outcome: Progressing   Problem: Nutrition: Goal: Adequate nutrition will be maintained Outcome: Progressing   Problem: Coping: Goal: Level of anxiety will decrease Outcome: Progressing   Problem: Elimination: Goal: Will not experience complications related to bowel motility Outcome: Progressing Goal: Will not experience complications related to urinary retention Outcome: Progressing   Problem: Pain Managment: Goal: General experience of comfort will improve and/or be controlled Outcome: Progressing   Problem: Safety: Goal: Ability to remain free from injury will improve Outcome: Progressing   Problem: Skin Integrity: Goal: Risk for impaired skin integrity will decrease Outcome: Progressing

## 2024-04-17 NOTE — Progress Notes (Signed)
 Mobility Specialist - Progress Note   04/17/24 1143  Mobility  Activity Ambulated independently in hallway  Level of Assistance Independent  Assistive Device None  Distance Ambulated (ft) 500 ft  Activity Response Tolerated well  Mobility Referral Yes  Mobility visit 1 Mobility  Mobility Specialist Start Time (ACUTE ONLY) 1137  Mobility Specialist Stop Time (ACUTE ONLY) 1142  Mobility Specialist Time Calculation (min) (ACUTE ONLY) 5 min   Pt received in bed and agreeable to mobility. No complaints during session. Pt to bed after session with all needs met.    [facility]

## 2024-04-17 NOTE — Plan of Care (Signed)

## 2024-04-17 NOTE — Progress Notes (Signed)
 Discharge instructions given to patient and all questions were answered.

## 2024-04-17 NOTE — Discharge Instructions (Signed)
EATING AFTER YOUR ESOPHAGEAL SURGERY (Stomach Fundoplication, Hiatal Hernia repair, Achalasia surgery, etc)  ######################################################################  EAT Start with a full liquid diet (see below) Gradually transition to a high fiber diet with a fiber supplement over the next month after discharge.    WALK Walk an hour a day.  Control your pain to do that.    CONTROL PAIN Control pain so that you can walk, sleep, tolerate sneezing/coughing, go up/down stairs.  HAVE A BOWEL MOVEMENT DAILY Keep your bowels regular to avoid problems.  OK to try a laxative to override constipation.  OK to use an antidairrheal to slow down diarrhea.  Call if not better after 2 tries  CALL IF YOU HAVE PROBLEMS/CONCERNS Call if you are still struggling despite following these instructions. Call if you have concerns not answered by these instructions  ######################################################################   After your esophageal surgery, expect some sticking with swallowing over the next 1-2 months.    If food sticks when you eat, it is called "dysphagia".  This is due to swelling around your esophagus at the wrap & hiatal diaphragm repair.  It will gradually ease off over the next few months.  To help you through this temporary phase, we start you out on a full liquid diet.  Your first meal in the hospital was thin liquids.  You should have been given a full liquid diet by the time you left the hospital. Stay on clears and full liquids for the first week. Some patients may need to stay on a liquid diet for up to 2 weeks if having trouble swallowing.  Once tolerating that well, you can advance to pureed diet.   We ask patients to stay on a pureed diet for the 2nd-3rd week to avoid anything getting "stuck" near your recent surgery.  Don't be alarmed if your ability to swallow doesn't progress according to this plan.  Everyone is different and some diets can advance  more or less quickly.    It is often helpful to crush your medications or split them as they can sometimes stick, especially the first week or so.   Some BASIC RULES to follow are: Maintain an upright position whenever eating or drinking. Take small bites - just a teaspoon size bite at a time. Eat slowly.  It may also help to eat only one food at a time. Consider nibbling through smaller, more frequent meals & avoid the urge to eat BIG meals Do not push through feelings of fullness, nausea, or bloatedness Do not mix solid foods and liquids in the same mouthful Try not to "wash foods down" with large gulps of liquids. Avoid carbonated (bubbly/fizzy) drinks.   Avoid foods that make you feel gassy or bloated.  Start with bland foods first.  Wait on trying greasy, fried, or spicy meals until you are tolerating more bland solids well. Understand that it will be hard to burp and belch at first.  This gradually improves with time.  Expect to be more gassy/flatulent/bloated initially.  Walking will help your body manage it better. Consider using medications for bloating that contain simethicone such as  Maalox or Gas-X  Consider crushing her medications, especially smaller pills.  The ability to swallow pills should get easier after a few weeks Eat in a relaxed atmosphere & minimize distractions. Avoid talking while eating.   Do not use straws. Following each meal, sit in an upright position (90 degree angle) for 60 to 90 minutes.  Going for a short walk can   help as well If food does stick, don't panic.  Try to relax and let the food pass on its own.  Sipping WARM LIQUID such as strong hot black tea can also help slide it down.   Be gradual in changes & use common sense:  -If you easily tolerating a certain "level" of foods, advance to the next level gradually -If you are having trouble swallowing a particular food, then avoid it.   -If food is sticking when you advance your diet, go back to  thinner previous diet (the lower LEVEL) for 1-2 days.  LEVEL 2 = PUREED DIET  Start 1- 2 WEEKS AFTER SURGERY IF YOU ARE TOLERATING A FULL LIQUID DIET EASILY  -Foods in this group are pureed or blenderized to a smooth, mashed potato-like consistency.  -If necessary, the pureed foods can keep their shape with the addition of a thickening agent.   -Meat should be pureed to a smooth, pasty consistency.  Hot broth or gravy may be added to the pureed meat, approximately 1 oz. of liquid per 3 oz. serving of meat. -CAUTION:  If any foods do not puree into a smooth consistency, swallowing will be more difficult.  (For example, nuts or seeds sometimes do not blend well.)  Hot Foods Cold Foods  Pureed scrambled eggs and cheese Pureed cottage cheese  Baby cereals Thickened juices and nectars  Thinned cooked cereals (no lumps) Thickened milk or eggnog  Pureed French toast or pancakes Ensure  Mashed potatoes Ice cream  Pureed parsley, au gratin, scalloped potatoes, candied sweet potatoes Fruit or Italian ice, sherbet  Pureed buttered or alfredo noodles Plain yogurt  Pureed vegetables (no corn or peas) Instant breakfast  Pureed soups and creamed soups Smooth pudding, mousse, custard  Pureed scalloped apples Whipped gelatin  Gravies Sugar, syrup, honey, jelly  Sauces, cheese, tomato, barbecue, white, creamed Cream  Any baby food Creamer  Alcohol in moderation (not beer or champagne) Margarine  Coffee or tea Mayonnaise   Ketchup, mustard   Apple sauce   SAMPLE MENU:  PUREED DIET Breakfast Lunch Dinner  Orange juice, 1/2 cup Cream of wheat, 1/2 cup Pineapple juice, 1/2 cup Pureed turkey, barley soup, 3/4 cup Pureed Hawaiian chicken, 3 oz  Scrambled eggs, mashed or blended with cheese, 1/2 cup Tea or coffee, 1 cup  Whole milk, 1 cup  Non-dairy creamer, 2 Tbsp. Mashed potatoes, 1/2 cup Pureed cooled broccoli, 1/2 cup Apple sauce, 1/2 cup Coffee or tea Mashed potatoes, 1/2 cup Pureed spinach,  1/2 cup Frozen yogurt, 1/2 cup Tea or coffee      LEVEL 3 = SOFT DIET  After your first 4 weeks, you can advance to a soft diet.   Keep on this diet until everything goes down easily.  Hot Foods Cold Foods  White fish Cottage cheese  Stuffed fish Junior baby fruit  Baby food meals Semi thickened juices  Minced soft cooked, scrambled, poached eggs nectars  Souffle & omelets Ripe mashed bananas  Cooked cereals Canned fruit, pineapple sauce, milk  potatoes Milkshake  Buttered or Alfredo noodles Custard  Cooked cooled vegetable Puddings, including tapioca  Sherbet Yogurt  Vegetable soup or alphabet soup Fruit ice, Italian ice  Gravies Whipped gelatin  Sugar, syrup, honey, jelly Junior baby desserts  Sauces:  Cheese, creamed, barbecue, tomato, white Cream  Coffee or tea Margarine   SAMPLE MENU:  LEVEL 3 Breakfast Lunch Dinner  Orange juice, 1/2 cup Oatmeal, 1/2 cup Scrambled eggs with cheese, 1/2 cup Decaffeinated tea,   1 cup Whole milk, 1 cup Non-dairy creamer, 2 Tbsp Pineapple juice, 1/2 cup Minced beef, 3 oz Gravy, 2 Tbsp Mashed potatoes, 1/2 cup Minced fresh broccoli, 1/2 cup Applesauce, 1/2 cup Coffee, 1 cup Turkey, barley soup, 3/4 cup Minced Hawaiian chicken, 3 oz Mashed potatoes, 1/2 cup Cooked spinach, 1/2 cup Frozen yogurt, 1/2 cup Non-dairy creamer, 2 Tbsp      LEVEL 4 = CHOPPED DIET  -After all the foods in level 3 (soft diet) are passing through well you should advance up to more chopped foods.  -It is still important to cut these foods into small pieces and eat slowly.  Hot Foods Cold Foods  Poultry Cottage cheese  Chopped Swedish meatballs Yogurt  Meat salads (ground or flaked meat) Milk  Flaked fish (tuna) Milkshakes  Poached or scrambled eggs Soft, cold, dry cereal  Souffles and omelets Fruit juices or nectars  Cooked cereals Chopped canned fruit  Chopped French toast or pancakes Canned fruit cocktail  Noodles or pasta (no rice) Pudding,  mousse, custard  Cooked vegetables (no frozen peas, corn, or mixed vegetables) Green salad  Canned small sweet peas Ice cream  Creamed soup or vegetable soup Fruit ice, Italian ice  Pureed vegetable soup or alphabet soup Non-dairy creamer  Ground scalloped apples Margarine  Gravies Mayonnaise  Sauces:  Cheese, creamed, barbecue, tomato, white Ketchup  Coffee or tea Mustard   SAMPLE MENU:  LEVEL 4 Breakfast Lunch Dinner  Orange juice, 1/2 cup Oatmeal, 1/2 cup Scrambled eggs with cheese, 1/2 cup Decaffeinated tea, 1 cup Whole milk, 1 cup Non-dairy creamer, 2 Tbsp Ketchup, 1 Tbsp Margarine, 1 tsp Salt, 1/4 tsp Sugar, 2 tsp Pineapple juice, 1/2 cup Ground beef, 3 oz Gravy, 2 Tbsp Mashed potatoes, 1/2 cup Cooked spinach, 1/2 cup Applesauce, 1/2 cup Decaffeinated coffee Whole milk Non-dairy creamer, 2 Tbsp Margarine, 1 tsp Salt, 1/4 tsp Pureed turkey, barley soup, 3/4 cup Barbecue chicken, 3 oz Mashed potatoes, 1/2 cup Ground fresh broccoli, 1/2 cup Frozen yogurt, 1/2 cup Decaffeinated tea, 1 cup Non-dairy creamer, 2 Tbsp Margarine, 1 tsp Salt, 1/4 tsp Sugar, 1 tsp    LEVEL 5:  REGULAR FOODS  -Foods in this group are soft, moist, regularly textured foods.   -This level includes meat and breads, which tend to be the hardest things to swallow.   -Eat very slowly, chew well and continue to avoid carbonated drinks. -most people are at this level in 6 weeks  Hot Foods Cold Foods  Baked fish or skinned Soft cheeses - cottage cheese  Souffles and omelets Cream cheese  Eggs Yogurt  Stuffed shells Milk  Spaghetti with meat sauce Milkshakes  Cooked cereal Cold dry cereals (no nuts, dried fruit, coconut)  French toast or pancakes Crackers  Buttered toast Fruit juices or nectars  Noodles or pasta (no rice) Canned fruit  Potatoes (all types) Ripe bananas  Soft, cooked vegetables (no corn, lima, or baked beans) Peeled, ripe, fresh fruit  Creamed soups or vegetable soup Cakes  (no nuts, dried fruit, coconut)  Canned chicken noodle soup Plain doughnuts  Gravies Ice cream  Bacon dressing Pudding, mousse, custard  Sauces:  Cheese, creamed, barbecue, tomato, white Fruit ice, Italian ice, sherbet  Decaffeinated tea or coffee Whipped gelatin  Pork chops Regular gelatin   Canned fruited gelatin molds   Sugar, syrup, honey, jam, jelly   Cream   Non-dairy   Margarine   Oil   Mayonnaise   Ketchup   Mustard   TROUBLESHOOTING IRREGULAR BOWELS    1) Avoid extremes of bowel movements (no bad constipation/diarrhea)  2) Miralax 17gm mixed in 8oz. water or juice-daily. May use BID as needed.  3) Gas-x,Phazyme, etc. as needed for gas & bloating.  4) Soft,bland diet. No spicy,greasy,fried foods.  5) Prilosec over-the-counter as needed  6) May hold gluten/wheat products from diet to see if symptoms improve.  7) May try probiotics (Align, Activa, etc) to help calm the bowels down  7) If symptoms become worse call back immediately.    If you have any questions please call our office at [facility]: [phone].  

## 2024-04-17 NOTE — Progress Notes (Signed)
   04/17/24 1032  TOC Brief Assessment  Insurance and Status Reviewed  Patient has primary care physician Yes  Home environment has been reviewed resides in private residence  Prior level of function: Independent  Prior/Current Home Services No current home services  Social Drivers of Health Review SDOH reviewed no interventions necessary  Readmission risk has been reviewed Yes  Transition of care needs no transition of care needs at this time

## 2024-04-20 ENCOUNTER — Other Ambulatory Visit (HOSPITAL_COMMUNITY): Payer: Self-pay

## 2024-05-20 ENCOUNTER — Other Ambulatory Visit: Payer: Self-pay | Admitting: Cardiology

## 2024-06-10 ENCOUNTER — Ambulatory Visit (HOSPITAL_COMMUNITY): Admit: 2024-06-10 | Payer: Medicare HMO | Admitting: Gastroenterology

## 2024-06-10 ENCOUNTER — Encounter (HOSPITAL_COMMUNITY): Payer: Self-pay

## 2024-06-10 SURGERY — MANOMETRY, ESOPHAGUS

## 2024-06-20 ENCOUNTER — Other Ambulatory Visit: Payer: Self-pay | Admitting: Cardiology

## 2024-08-04 ENCOUNTER — Other Ambulatory Visit: Payer: Self-pay | Admitting: Cardiology

## 2024-08-07 ENCOUNTER — Encounter: Payer: Self-pay | Admitting: Cardiovascular Disease

## 2024-08-07 ENCOUNTER — Ambulatory Visit: Payer: Medicare HMO | Attending: Cardiovascular Disease | Admitting: Cardiovascular Disease

## 2024-08-07 VITALS — BP 148/80 | HR 50 | Ht 65.0 in | Wt 190.4 lb

## 2024-08-07 DIAGNOSIS — I480 Paroxysmal atrial fibrillation: Secondary | ICD-10-CM | POA: Diagnosis not present

## 2024-08-07 NOTE — Patient Instructions (Signed)

## 2024-08-07 NOTE — Progress Notes (Signed)
  Electrophysiology Office Note:    Date:  08/07/2024   ID:  Julie Jefferson, DOB 06-04-1946, MRN [id number]  PCP:  Ezzard Elsie NOVAK, MD   [facility] Providers Cardiologist:  Jayson Sierras, MD Electrophysiologist:  Eulas FORBES Furbish, MD  Sleep Medicine:  Wilbert Bihari, MD     Referring MD: Ezzard Elsie NOVAK, MD   History of Present Illness:    Julie Jefferson is a 78 y.o. female with a medical history significant for paroxysmal atrial fibrillation, Takotsubo cardiomyopathy, obstructive sleep apnea, who presents for routine electrophysiology follow-up.     She has a history of paroxysmal atrial fibrillation and underwent an ablation by Dr. Kelsie in October 2022.  Since her last visit with me, she underwent a hernia repair at [facility].     She reports that she has been doing very well since her ablation and rarely has any symptoms of atrial fibrillation.   EKGs/Labs/Other Studies Reviewed Today:    Echocardiogram:  TTE March 27, 2022 Ejection fraction 70 to 75%.  Left atrium normal in size   Monitors:   Stress testing:    Advanced imaging:   Cardiac catherization    EKG:   EKG Interpretation Date/Time:  Friday August 07 2024 13:28:40 EDT Ventricular Rate:  50 PR Interval:  136 QRS Duration:  88 QT Interval:  458 QTC Calculation: 417 R Axis:   11  Text Interpretation: Sinus bradycardia Low voltage QRS When compared with ECG of 17-Feb-2024 15:26, Questionable change in QRS axis Confirmed by Furbish Eulas [phone]) on 08/07/2024 1:36:35 PM     Physical Exam:    VS:  BP (!) 148/80 (BP Location: Right Arm)   Pulse (!) 50   Ht 5' 5 (1.651 m)   Wt 190 lb 6.4 oz (86.4 kg)   SpO2 97%   BMI 31.68 kg/m     Wt Readings from Last 3 Encounters:  08/07/24 190 lb 6.4 oz (86.4 kg)  04/16/24 195 lb (88.5 kg)  04/08/24 195 lb (88.5 kg)     GEN:  Well nourished, well developed in no acute distress CARDIAC: RRR, no murmurs, rubs, gallops RESPIRATORY:   Normal work of breathing MUSCULOSKELETAL: no edema    ASSESSMENT & PLAN:    Paroxysmal atrial fibrillation Rare recurrences -- she is symptomatic and aware of episodes Status post ablation by Dr. Kelsie in October 2022  Secondary hypercoagulable state CHA2DS2-VASc score is at least 5 Continue Eliquis  5 mg twice daily Labs 5/16 reviewed  Obesity BMI 34.08 -- improved since last visit with EP  Obstructive sleep apnea Uses CPAP I encouraged compliance with CPAP     Signed, Eulas FORBES Furbish, MD  08/07/2024 1:40 PM    [facility]

## 2024-08-08 ENCOUNTER — Other Ambulatory Visit: Payer: Self-pay | Admitting: Cardiology

## 2024-08-10 NOTE — Telephone Encounter (Signed)
 Prescription refill request for Eliquis  received. Indication: PAF Last office visit: 08/07/24  A Mealor MD Scr: 0.86 on 04/08/24  Epic Age: 78 Weight: 86.4kg  Based on above findings Eliquis  5mg  twice daily is the appropriate dose.  Refill approved.

## 2024-08-20 ENCOUNTER — Telehealth: Payer: Self-pay | Admitting: Nurse Practitioner

## 2024-08-20 ENCOUNTER — Ambulatory Visit: Admitting: Nurse Practitioner

## 2024-08-20 NOTE — Telephone Encounter (Signed)
 Spoke with patient regarding her symptoms. 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 She went home and rested everyday since. She states Sunday afternoon after getting home she began getting shooting pains in her chest, neck, mouth and jaw that only lasted for just a minute. Since then she has been very tired and weak. NTG?no  Med changes? No  BP Sunday 159/86  And since then it has been around 140/80 She states this has happened in the past but only with exertion and outside in the heat.  Patient states something in her stomach/chest has not felt right since surgery in may on her abdomen.   Patient states she is feeling very well today she would just like an appointment to talk with the provider on what this might could be, see if she needs to change any meds, get test done, etc.  Advised patient I would route to provider for review.and she is currently scheduled for the first available with anyone. Advised patient that our office is closed tomorrow although Jinnie piety, RN will be managing our triage tomorrow but I advised if her pain comes back and persist if we are not available go to the ED.

## 2024-08-20 NOTE — Telephone Encounter (Signed)
 Pt c/o of Chest Pain: STAT if active (IN THIS MOMENT) CP, including tightness, pressure, jaw pain, shoulder/upper arm/back pain, SOB, nausea, and vomiting.  1. Are you having CP right now (tightness, pressure, or discomfort)? No - pt states she had back and jaw pain on Sunday, she feels better now   2. Are you experiencing any other symptoms (ex. SOB, nausea, vomiting, sweating)? No, states she felt fatigue   3. How long have you been experiencing CP? Lasted a few minutes   4. Is your CP continuous or coming and going? Continuous that day   5. Have you taken Nitroglycerin ? No   6. If CP returns before callback, please consider calling 911. ?

## 2024-08-21 NOTE — Telephone Encounter (Signed)
 Appointment scheduled to see Lenze on Monday, 08/24/2024 @10 :15 am in [place] [facility] by scheduler and patient aware.

## 2024-08-21 NOTE — Telephone Encounter (Signed)
 Denies current chest or jaw pain.Reports she did feel better on yesterday and today she feels even better. Reports staying well hydrated. Reports she has never had a nitroglycerin  prescription. Reports she has an appointment with her PCP on Monday 08/24/2024. Reports she is willing to see a provider at our [place] locations. Advised that she has been added to the Wait List and if her symptoms return, she needed to go to the ED for an evaluation. Will also forward to schedulers and PCP cardiologist.

## 2024-08-24 ENCOUNTER — Ambulatory Visit (INDEPENDENT_AMBULATORY_CARE_PROVIDER_SITE_OTHER)

## 2024-08-24 ENCOUNTER — Ambulatory Visit: Attending: Physician Assistant | Admitting: Physician Assistant

## 2024-08-24 ENCOUNTER — Encounter: Payer: Self-pay | Admitting: Physician Assistant

## 2024-08-24 VITALS — BP 110/72 | HR 53 | Ht 65.0 in | Wt 194.0 lb

## 2024-08-24 DIAGNOSIS — R55 Syncope and collapse: Secondary | ICD-10-CM | POA: Diagnosis not present

## 2024-08-24 DIAGNOSIS — I2510 Atherosclerotic heart disease of native coronary artery without angina pectoris: Secondary | ICD-10-CM

## 2024-08-24 DIAGNOSIS — I10 Essential (primary) hypertension: Secondary | ICD-10-CM

## 2024-08-24 DIAGNOSIS — R079 Chest pain, unspecified: Secondary | ICD-10-CM

## 2024-08-24 DIAGNOSIS — E782 Mixed hyperlipidemia: Secondary | ICD-10-CM

## 2024-08-24 DIAGNOSIS — E66811 Obesity, class 1: Secondary | ICD-10-CM

## 2024-08-24 DIAGNOSIS — I5181 Takotsubo syndrome: Secondary | ICD-10-CM

## 2024-08-24 DIAGNOSIS — D6869 Other thrombophilia: Secondary | ICD-10-CM | POA: Diagnosis not present

## 2024-08-24 DIAGNOSIS — I340 Nonrheumatic mitral (valve) insufficiency: Secondary | ICD-10-CM

## 2024-08-24 DIAGNOSIS — G4733 Obstructive sleep apnea (adult) (pediatric): Secondary | ICD-10-CM

## 2024-08-24 DIAGNOSIS — I480 Paroxysmal atrial fibrillation: Secondary | ICD-10-CM | POA: Diagnosis not present

## 2024-08-24 LAB — LIPID PANEL

## 2024-08-24 NOTE — Progress Notes (Unsigned)
 Applied a 14 day Zio XT monitor to patient in the office  Debera to read

## 2024-08-24 NOTE — Progress Notes (Signed)
 Cardiology Office Note:  .   Date:  08/24/2024  ID:  Julie Jefferson, DOB 1946/02/23, MRN [id number] PCP: Ezzard Elsie NOVAK, MD  [facility] Providers Cardiologist:  Jayson Sierras, MD Electrophysiologist:  Eulas FORBES Furbish, MD  Sleep Medicine:  Wilbert Bihari, MD    History of Present Illness: .   Julie Jefferson is a 78 y.o. female  with a medical history significant for paroxysmal atrial fibrillation S/P ablation 09/2021, Takotsubo cardiomyopathy, obstructive sleep apnea, HTN, HLD, minor coronary atherosclerosis.   Patient comes in with her daughter. Had Nissen surgery in May that she is still getting over. 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 She sat down. Later that day she had chest pain that went to her neck and teeth lasting a minute or two. Says she wasn't in Afib. She felt her heart quiver and then flip flop before it stopped. Had similar chest pain before but not into her jaw. She has been weak and dizzy since. Taking meclizine . . Has had inner ear trouble. Yesterday when in her recliner she felt weak, sweaty, dizzy, lasted 2-3 minutes then tried to stay hydrated and does use liquid IV if needed.  BP 97/64 P 61. It's happened in the past with working hard in the yard but never at rest.   ROS:    Studies Reviewed: SABRA         Prior CV Studies:   Echo 03/2022:  1. Left ventricular ejection fraction, by estimation, is 70 to 75%. The  left ventricle has hyperdynamic function. The left ventricle has no  regional wall motion abnormalities. Left ventricular diastolic parameters  are indeterminate. The average left  ventricular global longitudinal strain is -20.2 %. The global longitudinal  strain is normal.   2. Right ventricular systolic function is normal. The right ventricular  size is normal. Tricuspid regurgitation signal is inadequate for assessing  PA pressure.   3. The mitral valve is normal in structure. Mild mitral  valve  regurgitation. No evidence of mitral stenosis.   4. The aortic valve is tricuspid. Aortic valve regurgitation is not  visualized. No aortic stenosis is present.   5. Pulmonic valve regurgitation is moderate.   6. The inferior vena cava is normal in size with greater than 50%  respiratory variability, suggesting right atrial pressure of 3 mmHg.   Comparison(s): LVEF 60-65%.   CT cardiac 09/2021:  IMPRESSION: 1. There is normal pulmonary vein drainage into the left atrium.   2. The left atrial appendage is large broccoli type with two lobes and ostial size 26 x 22 mm and length 34 mm. There is no thrombus in the left atrial appendage.   3. The esophagus runs in the left atrial midline and is closest to the left lower pulmonary vein.   4. Diffuse coronary calcification. Coronary calcium score 401, which is 83rd percentile for age and gender.   IMPRESSION: 1.  Aortic Atherosclerosis (ICD10-I70.0). 2. Moderate-sized hiatal hernia.   Lexiscan  05/2019: No diagnostic ST segment changes to indicate ischemia. Small, mild intensity, partially reversible apical anteroseptal defect that is most consistent with variable soft tissue attenuation. No large ischemic territories noted. This is a low risk study. Nuclear stress EF: 71%.      Risk Assessment/Calculations:    CHA2DS2-VASc Score = 5   This indicates a 7.2% annual risk of stroke. The patient's score is based upon: CHF History: 0 HTN History: 1 Diabetes History: 0  Stroke History: 0 Vascular Disease History: 1 Age Score: 2 Gender Score: 1         Physical Exam:   VS:  BP 110/72 (BP Location: Right Arm, Patient Position: Sitting, Cuff Size: Normal)   Pulse (!) 53   Ht 5' 5 (1.651 m)   Wt 194 lb (88 kg)   SpO2 99%   BMI 32.28 kg/m    Orhtostatics: No data found. Wt Readings from Last 3 Encounters:  08/24/24 194 lb (88 kg)  08/07/24 190 lb 6.4 oz (86.4 kg)  04/16/24 195 lb (88.5 kg)    GEN: Well nourished,  well developed in no acute distress NECK: No JVD; No carotid bruits CARDIAC:  RRR, no murmurs, rubs, gallops RESPIRATORY:  Clear to auscultation without rales, wheezing or rhonchi  ABDOMEN: Soft, non-tender, non-distended EXTREMITIES:  No edema; No deformity   ASSESSMENT AND PLAN: .    Presyncope-2 episodes while sitting. Not eating as much since Nissen surgery in May and probably not staying as hydrated as she needs to. Not orthostatic in the office today.  -check labs today  Chest pain into jaw at rest. Does have Coronary calcification on CT 2022 and negative lexiscan  scan 2020, will repeat lexiscan   Paroxysmal atrial fibrillation Rare recurrences -- she is symptomatic and aware of episodes Status post ablation by Dr. Kelsie in October 2022 Order Zio to rule out arrhythmia   Secondary hypercoagulable state CHA2DS2-VASc score is at least 5 Continue Eliquis  5 mg twice daily No bleeding problems, check labs today   Obesity BMI 34.08 -- improved since last visit with EP   Obstructive sleep apnea Intolerant to CPAP   HTN-BP running lower since GI surgery  History of Takotsubo cardiomyopathy  HLD-no recent lipid profile  Mild MR  Mild coronary atherosclerosis  Obesity BMI 32 continues to improve     Informed Consent   Shared Decision Making/Informed Consent The risks [chest pain, shortness of breath, cardiac arrhythmias, dizziness, blood pressure fluctuations, myocardial infarction, stroke/transient ischemic attack, nausea, vomiting, allergic reaction, radiation exposure, metallic taste sensation and life-threatening complications (estimated to be 1 in 10,000)], benefits (risk stratification, diagnosing coronary artery disease, treatment guidance) and alternatives of a nuclear stress test were discussed in detail with Julie Jefferson and she agrees to proceed.     Dispo: f/u Dr. Debera Car 1 month  Signed, Olivia Pavy, PA-C

## 2024-08-24 NOTE — Patient Instructions (Signed)
 Medication Instructions:  Your physician recommends that you continue on your current medications as directed. Please refer to the Current Medication list given to you today.  *If you need a refill on your cardiac medications before your next appointment, please call your pharmacy*  Lab Work: TODAY- CBC, CMET, TSH, FASTING LIPIDs If you have labs (blood work) drawn today and your tests are completely normal, you will receive your results only by: MyChart Message (if you have MyChart) OR A paper copy in the mail If you have any lab test that is abnormal or we need to change your treatment, we will call you to review the results.  Testing/Procedures: Your physician has requested that you have a lexiscan  myoview . For further information please visit [email or web address] Please follow instruction sheet, as given.   ZIO XT- Long Term Monitor Instructions  Your physician has requested you wear a ZIO patch monitor for 14 days.  This is a single patch monitor. [employer] supplies one patch monitor per enrollment. Additional stickers are not available. Please do not apply patch if you will be having a Nuclear Stress Test,  Echocardiogram, Cardiac CT, MRI, or Chest Xray during the period you would be wearing the  monitor. The patch cannot be worn during these tests. You cannot remove and re-apply the  ZIO XT patch monitor.  Billing and Patient Assistance Program Information  We have supplied [employer] with any of your insurance information on file for billing purposes. [employer] offers a sliding scale Patient Assistance Program for patients that do not have  insurance, or whose insurance does not completely cover the cost of the ZIO monitor.  You must apply for the Patient Assistance Program to qualify for this discounted rate.  To apply, please call [employer] at [phone], select option 4, select option 2, ask to apply for  Patient Assistance Program. Meredeth will ask your household income, and how  many people  are in your household. They will quote your out-of-pocket cost based on that information.  [employer] will also be able to set up a 63-month, interest-free payment plan if needed.  Applying the monitor When you are ready to remove the patch, follow instructions on the last 2 pages of Patient  Logbook. Stick patch monitor onto the last page of Patient Logbook.  Place Patient Logbook in the blue and white box. Use locking tab on box and tape box closed  securely. The blue and white box has prepaid postage on it. Please place it in the mailbox as  soon as possible. Your physician should have your test results approximately 7 days after the  monitor has been mailed back to [facility].  Call [facility] Customer Care at [phone] if you have questions regarding  your ZIO XT patch monitor. Call them immediately if you see an orange light blinking on your  monitor.  If your monitor falls off in less than 4 days, contact our Monitor department at [phone].  If your monitor becomes loose or falls off after 4 days call [employer] at [phone] for  suggestions on securing your monitor    Follow-Up: At [facility], you and your health needs are our priority.  As part of our continuing mission to provide you with exceptional heart care, our providers are all part of one team.  This team includes your primary Cardiologist (physician) and Advanced Practice Providers or APPs (Physician Assistants and Nurse Practitioners) who all work together to provide you with the care you need, when you need it.  Your next appointment:   1 month(s)  Provider:   Dr. Mcdowell in [place]

## 2024-08-25 ENCOUNTER — Ambulatory Visit: Payer: Self-pay | Admitting: Physician Assistant

## 2024-08-25 LAB — LIPID PANEL
Cholesterol, Total: 187 mg/dL (ref 100–199)
HDL: 63 mg/dL (ref >39)
LDL CALC COMMENT:: 3 ratio (ref 0.0–4.4)
LDL Chol Calc (NIH): 85 mg/dL (ref 0–99)
Triglycerides: 235 mg/dL — AB (ref 0–149)
VLDL Cholesterol Cal: 39 mg/dL (ref 5–40)

## 2024-08-25 LAB — COMPREHENSIVE METABOLIC PANEL WITH GFR
ALT: 26 IU/L (ref 0–32)
AST: 26 IU/L (ref 0–40)
Albumin: 4.5 g/dL (ref 3.8–4.8)
Alkaline Phosphatase: 71 IU/L (ref 49–135)
BUN/Creatinine Ratio: 22 (ref 12–28)
BUN: 17 mg/dL (ref 8–27)
Bilirubin Total: 0.2 mg/dL (ref 0.0–1.2)
CO2: 22 mmol/L (ref 20–29)
Calcium: 10.2 mg/dL (ref 8.7–10.3)
Chloride: 102 mmol/L (ref 96–106)
Creatinine, Ser: 0.79 mg/dL (ref 0.57–1.00)
Globulin, Total: 2.5 g/dL (ref 1.5–4.5)
Glucose: 84 mg/dL (ref 70–99)
Potassium: 4.9 mmol/L (ref 3.5–5.2)
Sodium: 140 mmol/L (ref 134–144)
Total Protein: 7 g/dL (ref 6.0–8.5)
eGFR: 77 mL/min/1.73 (ref >59)

## 2024-08-25 LAB — CBC
Hematocrit: 40.5 % (ref 34.0–46.6)
Hemoglobin: 12.7 g/dL (ref 11.1–15.9)
MCH: 29.3 pg (ref 26.6–33.0)
MCHC: 31.4 g/dL — ABNORMAL LOW (ref 31.5–35.7)
MCV: 93 fL (ref 79–97)
Platelets: 311 x10E3/uL (ref 150–450)
RBC: 4.34 x10E6/uL (ref 3.77–5.28)
RDW: 13.7 % (ref 11.7–15.4)
WBC: 5.5 x10E3/uL (ref 3.4–10.8)

## 2024-08-25 LAB — TSH: TSH: 5.8 u[IU]/mL — AB (ref 0.450–4.500)

## 2024-08-31 ENCOUNTER — Ambulatory Visit (HOSPITAL_BASED_OUTPATIENT_CLINIC_OR_DEPARTMENT_OTHER)
Admission: RE | Admit: 2024-08-31 | Discharge: 2024-08-31 | Disposition: A | Discharge status: Home / Self Care | Source: Ambulatory Visit | Attending: Physician Assistant | Admitting: Physician Assistant

## 2024-08-31 ENCOUNTER — Other Ambulatory Visit: Payer: Self-pay | Admitting: Physician Assistant

## 2024-08-31 ENCOUNTER — Ambulatory Visit (HOSPITAL_COMMUNITY)
Admission: RE | Admit: 2024-08-31 | Discharge: 2024-08-31 | Disposition: A | Discharge status: Home / Self Care | Source: Ambulatory Visit | Attending: Physician Assistant | Admitting: Physician Assistant

## 2024-08-31 DIAGNOSIS — R079 Chest pain, unspecified: Secondary | ICD-10-CM | POA: Diagnosis not present

## 2024-08-31 LAB — NM MYOCAR MULTI W/SPECT W/WALL MOTION / EF
Base ST Depression (mm): 0 mm
LV dias vol: 83 mL (ref 46–106)
LV sys vol: 28 mL (ref 3.8–5.2)
MPHR: 142 {beats}/min
Nuc Stress EF: 66 %
Peak HR: 75 {beats}/min
Percent HR: 52 %
RATE: 0.4
Rest HR: 48 {beats}/min
Rest Nuclear Isotope Dose: 10.9 mCi
SDS: 2
SRS: 0
SSS: 2
ST Depression (mm): 0 mm
Stress Nuclear Isotope Dose: 32.1 mCi
TID: 1.17

## 2024-08-31 MED ORDER — TECHNETIUM TC 99M TETROFOSMIN IV KIT
10.9000 | PACK | Freq: Once | INTRAVENOUS | Status: AC | PRN
Start: 2024-08-31 — End: 2024-08-31
  Administered 2024-08-31: 10.9 via INTRAVENOUS

## 2024-08-31 MED ORDER — TECHNETIUM TC 99M TETROFOSMIN IV KIT
32.1000 | PACK | Freq: Once | INTRAVENOUS | Status: AC | PRN
Start: 2024-08-31 — End: 2024-08-31
  Administered 2024-08-31: 32.1 via INTRAVENOUS

## 2024-08-31 MED ORDER — SODIUM CHLORIDE FLUSH 0.9 % IV SOLN
INTRAVENOUS | Status: AC
  Administered 2024-08-31: 10 mL via INTRAVENOUS
  Filled 2024-08-31: qty 10

## 2024-08-31 MED ORDER — REGADENOSON 0.4 MG/5ML IV SOLN
INTRAVENOUS | Status: AC
  Administered 2024-08-31: 0.4 mg via INTRAVENOUS
  Filled 2024-08-31: qty 5

## 2024-08-31 NOTE — Progress Notes (Signed)
     Jahmia C Seyer presented for a Lexiscan  nuclear stress test today.  I Lorette CINDERELLA Kapur, PA-C, provided direct supervision and was present during the stress portion of the study today, which was completed without significant symptoms, immediate complications, or acute ST/T changes on ECG.  Stress imaging is pending at this time.  Preliminary ECG findings may be listed in the chart, but the stress test result will not be finalized until perfusion imaging is complete.  Lorette CINDERELLA Kapur, PA-C  08/31/2024, 10:09 AM

## 2024-09-03 ENCOUNTER — Ambulatory Visit (INDEPENDENT_AMBULATORY_CARE_PROVIDER_SITE_OTHER): Admitting: Physician Assistant

## 2024-09-03 ENCOUNTER — Encounter (INDEPENDENT_AMBULATORY_CARE_PROVIDER_SITE_OTHER): Payer: Self-pay | Admitting: Physician Assistant

## 2024-09-03 VITALS — BP 143/82 | HR 59 | Temp 98.6°F | Ht 65.0 in | Wt 190.0 lb

## 2024-09-03 DIAGNOSIS — R42 Dizziness and giddiness: Secondary | ICD-10-CM

## 2024-09-03 NOTE — Progress Notes (Signed)
 Dear Dr. Ezzard, Here is my assessment for our mutual patient, Julie Jefferson. Thank you for allowing me the opportunity to care for your patient. Please do not hesitate to contact me should you have any other questions. Sincerely, Chyrl Cohen PA-C  Otolaryngology Clinic Note Referring provider: Dr. Ezzard HPI:  Julie Jefferson is a 78 y.o. female kindly referred by Dr. Ezzard   The patient is a 78 year old female seen in our office for evaluation of vertigo.  The patient notes a significant past medical history of vertigo.  She notes usually presents with some head related movement symptoms with some dizziness.  She notes that taking meclizine  usually resolves her symptoms.  She notes that in August approximately 2 months ago she woke up with some dizziness typical of previous.  She notes trying meclizine  which did not improve her symptoms.  She notes the addition of ear fullness, some muffled type hearing and persistent balance and dizziness.  She followed up with her primary care provider and was placed on prednisone, Flonase and Zyrtec which did improve her symptoms for a week but did have return of symptoms thereafter.  Today she notes she is feeling better than she has over the last several days, she still feels slightly off balance, no associated hearing loss, she notes bilateral tonal tinnitus that has been present for the last several months.  She denies any associated neurologic deficits.  She notes an occasional frontal headache which is different for her.  No nausea.      Independent Review of Additional Tests or Records:  None   PMH/Meds/All/SocHx/FamHx/ROS:   Past Medical History:  Diagnosis Date   ACE-inhibitor cough    Anemia    Aortic atherosclerosis    Arthritis    Cancer (HCC)    breat   Closed right ankle fracture 2016   Collagen vascular disease    Complication of anesthesia    slow to wake up, needs low doses   Dysrhythmia    GERD (gastroesophageal reflux disease)     Glaucoma    History of breast cancer 2005   Left   Hypertension    Hypothyroidism    Mixed hyperlipidemia    Myocardial infarction (HCC)    OSA (obstructive sleep apnea)    mild with AHI 7.8/hr on auto CPAP   Paroxysmal atrial fibrillation (HCC)    Pelvic prolapse    Personal history of chemotherapy 2005   Personal history of radiation therapy 2005   Pneumonia    Takotsubo cardiomyopathy 2008   cardiac cath 11/08. minimal coronary disease, anteroseptal akinesis with an EF 55%.  echo 2/09 EF 60-65% (LV function normalized)   Wears glasses      Past Surgical History:  Procedure Laterality Date   ANKLE FRACTURE SURGERY Right 05/2015   ANTERIOR AND POSTERIOR REPAIR WITH SACROSPINOUS FIXATION N/A 08/20/2019   Procedure: ANTERIOR AND POSTERIOR REPAIR WITH possible SACROSPINOUS FIXATION;  Surgeon: Curlene Agent, MD;  Location: [facility];  Service: Gynecology;  Laterality: N/A;  need bed   ATRIAL FIBRILLATION ABLATION N/A 09/21/2021   Procedure: ATRIAL FIBRILLATION ABLATION;  Surgeon: Kelsie Agent, MD;  Location: [facility];  Service: Cardiovascular;  Laterality: N/A;   BACK SURGERY     BREAST BIOPSY  01/03/2012   BREAST LUMPECTOMY Left 2005   left breast   BUNIONECTOMY     left   CHOLECYSTECTOMY     laparoscopic   COLONOSCOPY     ESOPHAGOGASTRODUODENOSCOPY (EGD) WITH PROPOFOL  N/A 01/18/2023  Procedure: ESOPHAGOGASTRODUODENOSCOPY (EGD) WITH PROPOFOL ;  Surgeon: Rollin Dover, MD;  Location: [facility];  Service: Gastroenterology;  Laterality: N/A;   EYE SURGERY Bilateral    GANGLION CYST EXCISION     left   HOT HEMOSTASIS N/A 01/18/2023   Procedure: HOT HEMOSTASIS (ARGON PLASMA COAGULATION/BICAP);  Surgeon: Rollin Dover, MD;  Location: [facility];  Service: Gastroenterology;  Laterality: N/A;   PARTIAL KNEE ARTHROPLASTY Right 12/28/2016   Procedure: RIGHT UNICOMPARTMENTAL KNEE;  Surgeon: Evalene JONETTA Chancy, MD;  Location: [facility];   Service: Orthopedics;  Laterality: Right;   PARTIAL KNEE ARTHROPLASTY Left 09/23/2018   Procedure: UNICOMPARTMENTAL LEFT  KNEE;  Surgeon: Chancy Evalene JONETTA, MD;  Location: [facility];  Service: Orthopedics;  Laterality: Left;   POLYPECTOMY  01/18/2023   Procedure: POLYPECTOMY;  Surgeon: Rollin Dover, MD;  Location: [facility];  Service: Gastroenterology;;   TOTAL ABDOMINAL HYSTERECTOMY  1997   UPPER GI ENDOSCOPY     UPPER GI ENDOSCOPY N/A 04/16/2024   Procedure: ENDOSCOPY, UPPER GI TRACT;  Surgeon: Tanda Locus, MD;  Location: [facility];  Service: General;  Laterality: N/A;   XI ROBOTIC ASSISTED HIATAL HERNIA REPAIR N/A 04/16/2024   Procedure: REPAIR, HERNIA, HIATAL, ROBOT-ASSISTED WITH PARTIAL FUNDOPLICATION;  Surgeon: Tanda Locus, MD;  Location: [facility];  Service: General;  Laterality: N/A;    Family History  Problem Relation Age of Onset   Cancer Mother 81       breast   Breast cancer Mother    Heart disease Father    Coronary artery disease Other        family hx   Cancer Sister        LCIS 81   Breast cancer Sister    Cancer Maternal Aunt 21       breast   Breast cancer Maternal Aunt      Social Connections: Moderately Integrated (04/16/2024)   Social Connection and Isolation Panel    Frequency of Communication with Friends and Family: More than three times a week    Frequency of Social Gatherings with Friends and Family: More than three times a week    Attends Religious Services: More than 4 times per year    Active Member of [employer] or Organizations: Yes    Attends Banker Meetings: More than 4 times per year    Marital Status: Widowed      Current Outpatient Medications:    amLODipine  (NORVASC ) 5 MG tablet, Take 5 mg by mouth in the morning., Disp: , Rfl:    Biotin 89999 MCG TABS, Take 10,000 mcg by mouth at bedtime., Disp: , Rfl:    Dorzolamide  HCl-Timolol  Mal PF 2-0.5 % SOLN, Place 1 drop into both eyes 2 (two) times daily., Disp: , Rfl:    ELIQUIS  5 MG TABS  tablet, TAKE 1 TABLET BY MOUTH TWICE A DAY, Disp: 60 tablet, Rfl: 5   levothyroxine  (SYNTHROID ) 88 MCG tablet, Take 88 mcg by mouth daily before breakfast., Disp: , Rfl:    meclizine  (ANTIVERT ) 25 MG tablet, Take 25 mg by mouth 3 (three) times daily as needed for dizziness., Disp: , Rfl:    metoprolol  tartrate (LOPRESSOR ) 25 MG tablet, TAKE 1/2 TABLET BY MOUTH TWICE A DAY, Disp: 90 tablet, Rfl: 1   Multiple Vitamins-Minerals (MEGAVITE FRUITS & VEGGIES PO), Take 4 capsules by mouth in the morning. (2) Fruit + (2) Veggies (Balance of Nature Fruits & Veggies), Disp: , Rfl:    Omeprazole-Sodium Bicarbonate (ZEGERID) 20-1100 MG CAPS capsule,  Take 1 capsule by mouth daily before breakfast. (Patient not taking: Reported on 08/24/2024), Disp: , Rfl:    ondansetron  (ZOFRAN ) 4 MG tablet, Take 1 tablet (4 mg total) by mouth daily as needed for nausea or vomiting. (Patient not taking: Reported on 08/24/2024), Disp: 30 tablet, Rfl: 1   OVER THE COUNTER MEDICATION, Take 1 capsule by mouth 2 (two) times daily. Hydro Eye Supplement, Disp: , Rfl:    Polyethyl Glyc-Propyl Glyc PF (SYSTANE HYDRATION PF) 0.4-0.3 % SOLN, Place 1 drop into both eyes 3 (three) times daily as needed (dry/irritated eyes.)., Disp: , Rfl:    polyethylene glycol powder (GLYCOLAX /MIRALAX ) powder, Take 17 g by mouth daily as needed for mild constipation. , Disp: , Rfl:    simvastatin  (ZOCOR ) 20 MG tablet, TAKE 1 TABLET BY MOUTH EVERYDAY AT BEDTIME, Disp: 90 tablet, Rfl: 2   traMADol  (ULTRAM ) 50 MG tablet, Take 1 tablet (50 mg total) by mouth every 6 (six) hours as needed for moderate pain (pain score 4-6) or severe pain (pain score 7-10). (Patient not taking: Reported on 08/24/2024), Disp: 10 tablet, Rfl: 0   Physical Exam:   BP (!) 143/82 (BP Location: Right Arm) Comment: first attempt 143/82 second attempt 160/82  Pulse (!) 59   Temp 98.6 F (37 C)   Ht 5' 5 (1.651 m)   Wt 190 lb (86.2 kg)   SpO2 97%   BMI 31.62 kg/m   Pertinent Findings   CN II-XII intact Bilateral EAC clear and TM intact with well pneumatized middle ear spaces Anterior rhinoscopy: Septum midline; bilateral inferior turbinates with no hypertrophy No lesions of oral cavity/oropharynx; dentition within normal limits No obviously palpable neck masses/lymphadenopathy/thyromegaly No respiratory distress or stridor  Seprately Identifiable Procedures:  None none  Impression & Plans:  Julie Jefferson is a 78 y.o. female with the following   Vertigo-  78 year old female presenting today with ear related complaints and vertigo.  She has fullness in her ears, some persistent vertigo over the last 2 months.  She does have a history of the same but this was more episodic, this present episode has not completely resolved.  She has no neurologic deficits on exam today.  I would like audiological evaluation, fortunately we are able to get this done tomorrow.  Once these results are available I will discuss them with the patient.  Given her age and persistent dizziness I do think MR brain would be appropriate to rule out any significant pathology.  Will discuss his case more with her tomorrow after her audiological evaluation.   - f/u further discussion after audiological evaluation.   Thank you for allowing me the opportunity to care for your patient. Please do not hesitate to contact me should you have any other questions.  Sincerely, Chyrl Cohen PA-C [facility] Phone: [phone] Fax: [phone]  09/03/2024, 1:59 PM

## 2024-09-04 ENCOUNTER — Ambulatory Visit (INDEPENDENT_AMBULATORY_CARE_PROVIDER_SITE_OTHER): Admitting: Audiology

## 2024-09-04 DIAGNOSIS — H903 Sensorineural hearing loss, bilateral: Secondary | ICD-10-CM

## 2024-09-04 NOTE — Progress Notes (Signed)
  [address], [address] [place], [place] [address] [phone]  Audiological Evaluation    Name: Julie Jefferson     DOB:   1946/09/17      MRN:   [id number]                                                                                     Service Date: 09/04/2024     Accompanied by: unaccompanied   Patient comes today after Julie Cohen, PA-C sent a referral for a hearing evaluation due to concerns with dizziness.   Symptoms Yes Details  Hearing loss  []    Tinnitus  [x]  Both ears  Ear pain/ infections/pressure  [x]  Ear fullness  Balance problems  [x]  Off balance most of the tie- at time she reports vertigo  Noise exposure history  []    Previous ear surgeries  []    Family history of hearing loss  []    Amplification  []    Other  []      Otoscopy: Right ear: Clear external ear canal and notable landmarks visualized on the tympanic membrane. Left ear:  Clear external ear canal and notable landmarks visualized on the tympanic membrane.  Tympanometry: Right ear: Type A- Normal external ear canal volume with normal middle ear pressure and tympanic membrane compliance. Left ear: Type A- Normal external ear canal volume with normal middle ear pressure and tympanic membrane compliance.  Pure tone Audiometry: Right ear- Normal hearing from [phone] Hz, then presumably sensorineural hearing loss from 6000 Hz - 8000 Hz. Left ear-  Normal to severe sensorineural hearing loss from 125 Hz - 8000 Hz.   Speech Audiometry: Right ear- Speech Reception Threshold (SRT) was obtained at 15 dBHL. Left ear-Speech Reception Threshold (SRT) was obtained at 20 dBHL.   Word Recognition Score Tested using NU-6 (recorded) Right ear: 100% was obtained at a presentation level of 60 dBHL with contralateral masking which is deemed as  excellent. Left ear: 100% was obtained at a presentation level of 75 dBHL with contralateral masking which is deemed as  excellent.   The hearing test results  were completed under headphones and re-checked with inserts and results are deemed to be of good reliability. Test technique:  conventional    Impression:  Hearing asymmetry noted from 3000-8000 Hz, worse in the left ear.  Recommendations: Follow up with ENT as scheduled for today. Return for a hearing evaluation if concerns with hearing changes arise or per MD recommendation. Consider a communication needs assessment after medical clearance for hearing aids is obtained.   Julie Jefferson Julie Jefferson, AUD

## 2024-09-09 ENCOUNTER — Telehealth (INDEPENDENT_AMBULATORY_CARE_PROVIDER_SITE_OTHER): Payer: Self-pay

## 2024-09-09 ENCOUNTER — Telehealth (INDEPENDENT_AMBULATORY_CARE_PROVIDER_SITE_OTHER): Payer: Self-pay | Admitting: Physician Assistant

## 2024-09-09 DIAGNOSIS — H903 Sensorineural hearing loss, bilateral: Secondary | ICD-10-CM

## 2024-09-09 DIAGNOSIS — R42 Dizziness and giddiness: Secondary | ICD-10-CM

## 2024-09-09 NOTE — Telephone Encounter (Signed)
 I spoke with Julie Jefferson today.  I reviewed her audiological evaluation that shows asymmetric sensorineural hearing loss.  In the setting of vertigo I do think it is important to evaluate further with the MRI IAC.  The patient will complete this, she will follow-up in our office 2 weeks after completion for review of the study.

## 2024-09-09 NOTE — Telephone Encounter (Signed)
 Pt called stated you were suppose to call her on Monday, she said if you call her today call her on her cell phone.

## 2024-09-09 NOTE — Telephone Encounter (Signed)
Thanks I spoke with her

## 2024-09-10 ENCOUNTER — Ambulatory Visit (HOSPITAL_COMMUNITY)

## 2024-09-11 ENCOUNTER — Ambulatory Visit (HOSPITAL_COMMUNITY)
Admission: RE | Admit: 2024-09-11 | Discharge: 2024-09-11 | Disposition: A | Discharge status: Home / Self Care | Source: Ambulatory Visit | Attending: Physician Assistant | Admitting: Physician Assistant

## 2024-09-11 DIAGNOSIS — R42 Dizziness and giddiness: Secondary | ICD-10-CM | POA: Diagnosis present

## 2024-09-11 DIAGNOSIS — H903 Sensorineural hearing loss, bilateral: Secondary | ICD-10-CM | POA: Insufficient documentation

## 2024-09-11 MED ORDER — GADOBUTROL 1 MMOL/ML IV SOLN
7.5000 mL | Freq: Once | INTRAVENOUS | Status: AC | PRN
  Administered 2024-09-11: 7.5 mL via INTRAVENOUS

## 2024-09-14 DIAGNOSIS — R55 Syncope and collapse: Secondary | ICD-10-CM | POA: Diagnosis not present

## 2024-09-16 ENCOUNTER — Telehealth (INDEPENDENT_AMBULATORY_CARE_PROVIDER_SITE_OTHER): Payer: Self-pay

## 2024-09-16 NOTE — Telephone Encounter (Signed)
 Pt called returning your phone call about her MRI. Pt stated you can reach her on her cell [phone]

## 2024-09-17 ENCOUNTER — Ambulatory Visit (INDEPENDENT_AMBULATORY_CARE_PROVIDER_SITE_OTHER): Admitting: Physician Assistant

## 2024-09-17 ENCOUNTER — Encounter (INDEPENDENT_AMBULATORY_CARE_PROVIDER_SITE_OTHER): Payer: Self-pay | Admitting: Physician Assistant

## 2024-09-17 VITALS — BP 158/86 | HR 66 | Temp 98.0°F | Ht 65.0 in | Wt 190.0 lb

## 2024-09-17 DIAGNOSIS — R42 Dizziness and giddiness: Secondary | ICD-10-CM

## 2024-09-17 NOTE — Progress Notes (Signed)
 Dear Dr. Ezzard, Here is my assessment for our mutual patient, Julie Jefferson. Thank you for allowing me the opportunity to care for your patient. Please do not hesitate to contact me should you have any other questions. Sincerely, Chyrl Cohen PA-C  Otolaryngology Clinic Note Referring provider: Dr. Ezzard HPI:  Julie Jefferson is a 78 y.o. female kindly referred by Dr. Ezzard   The patient is a 78 year old female seen in our office for follow-up evaluation for dizziness.  The patient was last seen in office on 09/03/2024 close to recap encounter.  Update 09/18/2024-  Since her last office visit she notes continued ear fullness, is feeling off balance most of the day, she feels this is constant, no episodes of vertigo.  Her daughter who is a Engineer, civil (consulting) has accompanied her today.  She is concerned that her mother had an episode where she fell like she was going to pass out, she described as presyncopal, since that time she has had issues with balance.  She is currently followed by cardiology for this.  The patient did have an MRI brain and IAC with and without contrast that was completed on 09/11/2024 with no obvious source of dizziness.      Independent Review of Additional Tests or Records:  MR brain IAC with and without contrast 09/11/2024 MPRESSION: 1. No evidence of an acute intracranial abnormality. 2. No cerebellopontine angle or internal auditory canal mass. 3. No specific cause of hearing loss is identified. 4. 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 5. Mild background chronic small vessel ischemic changes within the cerebral white matter. 6. Moderate left sphenoid sinus disease.  Audiological evaluation 09/04/2024  Otoscopy: Right ear: Clear external ear canal and notable landmarks visualized on the tympanic membrane. Left ear:  Clear external ear canal and notable landmarks visualized on the tympanic membrane.   Tympanometry: Right ear:  Type A- Normal external ear canal volume with normal middle ear pressure and tympanic membrane compliance. Left ear: Type A- Normal external ear canal volume with normal middle ear pressure and tympanic membrane compliance.   Pure tone Audiometry: Right ear- Normal hearing from [phone] Hz, then presumably sensorineural hearing loss from 6000 Hz - 8000 Hz. Left ear-  Normal to severe sensorineural hearing loss from 125 Hz - 8000 Hz.     Speech Audiometry: Right ear- Speech Reception Threshold (SRT) was obtained at 15 dBHL. Left ear-Speech Reception Threshold (SRT) was obtained at 20 dBHL.   Word Recognition Score Tested using NU-6 (recorded) Right ear: 100% was obtained at a presentation level of 60 dBHL with contralateral masking which is deemed as  excellent. Left ear: 100% was obtained at a presentation level of 75 dBHL with contralateral masking which is deemed as  excellent.   The hearing test results were completed under headphones and re-checked with inserts and results are deemed to be of good reliability. Test technique:  conventional     Impression:  Hearing asymmetry noted from 3000-8000 Hz, worse in the left ear.  PMH/Meds/All/SocHx/FamHx/ROS:   Past Medical History:  Diagnosis Date   ACE-inhibitor cough    Anemia    Aortic atherosclerosis    Arthritis    Cancer (HCC)    breat   Closed right ankle fracture 2016   Collagen vascular disease    Complication of anesthesia    slow to wake up, needs low doses   Dysrhythmia    GERD (gastroesophageal reflux disease)    Glaucoma    History  of breast cancer 2005   Left   Hypertension    Hypothyroidism    Mixed hyperlipidemia    Myocardial infarction (HCC)    OSA (obstructive sleep apnea)    mild with AHI 7.8/hr on auto CPAP   Paroxysmal atrial fibrillation (HCC)    Pelvic prolapse    Personal history of chemotherapy 2005   Personal history of radiation therapy 2005   Pneumonia    Takotsubo cardiomyopathy 2008    cardiac cath 11/08. minimal coronary disease, anteroseptal akinesis with an EF 55%.  echo 2/09 EF 60-65% (LV function normalized)   Wears glasses      Past Surgical History:  Procedure Laterality Date   ANKLE FRACTURE SURGERY Right 05/2015   ANTERIOR AND POSTERIOR REPAIR WITH SACROSPINOUS FIXATION N/A 08/20/2019   Procedure: ANTERIOR AND POSTERIOR REPAIR WITH possible SACROSPINOUS FIXATION;  Surgeon: Curlene Agent, MD;  Location: [facility];  Service: Gynecology;  Laterality: N/A;  need bed   ATRIAL FIBRILLATION ABLATION N/A 09/21/2021   Procedure: ATRIAL FIBRILLATION ABLATION;  Surgeon: Kelsie Agent, MD;  Location: [facility];  Service: Cardiovascular;  Laterality: N/A;   BACK SURGERY     BREAST BIOPSY  01/03/2012   BREAST LUMPECTOMY Left 2005   left breast   BUNIONECTOMY     left   CHOLECYSTECTOMY     laparoscopic   COLONOSCOPY     ESOPHAGOGASTRODUODENOSCOPY (EGD) WITH PROPOFOL  N/A 01/18/2023   Procedure: ESOPHAGOGASTRODUODENOSCOPY (EGD) WITH PROPOFOL ;  Surgeon: Rollin Dover, MD;  Location: [facility];  Service: Gastroenterology;  Laterality: N/A;   EYE SURGERY Bilateral    GANGLION CYST EXCISION     left   HOT HEMOSTASIS N/A 01/18/2023   Procedure: HOT HEMOSTASIS (ARGON PLASMA COAGULATION/BICAP);  Surgeon: Rollin Dover, MD;  Location: [facility];  Service: Gastroenterology;  Laterality: N/A;   PARTIAL KNEE ARTHROPLASTY Right 12/28/2016   Procedure: RIGHT UNICOMPARTMENTAL KNEE;  Surgeon: Evalene JONETTA Chancy, MD;  Location: [facility];  Service: Orthopedics;  Laterality: Right;   PARTIAL KNEE ARTHROPLASTY Left 09/23/2018   Procedure: UNICOMPARTMENTAL LEFT  KNEE;  Surgeon: Chancy Evalene JONETTA, MD;  Location: [facility];  Service: Orthopedics;  Laterality: Left;   POLYPECTOMY  01/18/2023   Procedure: POLYPECTOMY;  Surgeon: Rollin Dover, MD;  Location: [facility];  Service: Gastroenterology;;   TOTAL ABDOMINAL HYSTERECTOMY  1997   UPPER GI  ENDOSCOPY     UPPER GI ENDOSCOPY N/A 04/16/2024   Procedure: ENDOSCOPY, UPPER GI TRACT;  Surgeon: Tanda Locus, MD;  Location: [facility];  Service: General;  Laterality: N/A;   XI ROBOTIC ASSISTED HIATAL HERNIA REPAIR N/A 04/16/2024   Procedure: REPAIR, HERNIA, HIATAL, ROBOT-ASSISTED WITH PARTIAL FUNDOPLICATION;  Surgeon: Tanda Locus, MD;  Location: [facility];  Service: General;  Laterality: N/A;    Family History  Problem Relation Age of Onset   Cancer Mother 33       breast   Breast cancer Mother    Heart disease Father    Coronary artery disease Other        family hx   Cancer Sister        LCIS 52   Breast cancer Sister    Cancer Maternal Aunt 76       breast   Breast cancer Maternal Aunt      Social Connections: Moderately Integrated (04/16/2024)   Social Connection and Isolation Panel    Frequency of Communication with Friends and Family: More than three times a week    Frequency  of Social Gatherings with Friends and Family: More than three times a week    Attends Religious Services: More than 4 times per year    Active Member of [employer] or Organizations: Yes    Attends Banker Meetings: More than 4 times per year    Marital Status: Widowed      Current Outpatient Medications:    amLODipine  (NORVASC ) 5 MG tablet, Take 5 mg by mouth in the morning., Disp: , Rfl:    Biotin 89999 MCG TABS, Take 10,000 mcg by mouth at bedtime., Disp: , Rfl:    Dorzolamide  HCl-Timolol  Mal PF 2-0.5 % SOLN, Place 1 drop into both eyes 2 (two) times daily., Disp: , Rfl:    ELIQUIS  5 MG TABS tablet, TAKE 1 TABLET BY MOUTH TWICE A DAY, Disp: 60 tablet, Rfl: 5   levothyroxine  (SYNTHROID ) 88 MCG tablet, Take 88 mcg by mouth daily before breakfast., Disp: , Rfl:    meclizine  (ANTIVERT ) 25 MG tablet, Take 25 mg by mouth 3 (three) times daily as needed for dizziness., Disp: , Rfl:    metoprolol  tartrate (LOPRESSOR ) 25 MG tablet, TAKE 1/2 TABLET BY MOUTH TWICE A DAY, Disp: 90 tablet, Rfl: 1    Multiple Vitamins-Minerals (MEGAVITE FRUITS & VEGGIES PO), Take 4 capsules by mouth in the morning. (2) Fruit + (2) Veggies (Balance of Nature Fruits & Veggies), Disp: , Rfl:    Omeprazole-Sodium Bicarbonate (ZEGERID) 20-1100 MG CAPS capsule, Take 1 capsule by mouth daily before breakfast., Disp: , Rfl:    ondansetron  (ZOFRAN ) 4 MG tablet, Take 1 tablet (4 mg total) by mouth daily as needed for nausea or vomiting., Disp: 30 tablet, Rfl: 1   OVER THE COUNTER MEDICATION, Take 1 capsule by mouth 2 (two) times daily. Hydro Eye Supplement, Disp: , Rfl:    Polyethyl Glyc-Propyl Glyc PF (SYSTANE HYDRATION PF) 0.4-0.3 % SOLN, Place 1 drop into both eyes 3 (three) times daily as needed (dry/irritated eyes.)., Disp: , Rfl:    polyethylene glycol powder (GLYCOLAX /MIRALAX ) powder, Take 17 g by mouth daily as needed for mild constipation. , Disp: , Rfl:    simvastatin  (ZOCOR ) 20 MG tablet, TAKE 1 TABLET BY MOUTH EVERYDAY AT BEDTIME, Disp: 90 tablet, Rfl: 2   traMADol  (ULTRAM ) 50 MG tablet, Take 1 tablet (50 mg total) by mouth every 6 (six) hours as needed for moderate pain (pain score 4-6) or severe pain (pain score 7-10)., Disp: 10 tablet, Rfl: 0   Physical Exam:   BP (!) 158/86 (BP Location: Right Arm, Patient Position: Sitting, Cuff Size: Normal)   Pulse 66   Temp 98 F (36.7 C) (Oral)   Ht 5' 5 (1.651 m)   Wt 190 lb (86.2 kg)   SpO2 99%   BMI 31.62 kg/m   Pertinent Findings  CN II-XII grossly intact Bilateral EAC clear and TM intact with well pneumatized middle ear spaces No lesions of oral cavity/oropharynx; dentition wnl No obviously palpable neck masses/lymphadenopathy/thyromegaly No respiratory distress or stridor  Seprately Identifiable Procedures:  None  Impression & Plans:  Julie Jefferson is a 78 y.o. female with the following   Dizziness-  Persistent dizziness, no vertiginous symptoms today.  Lower suspicion for ENT related cause, it would continue outpatient follow-up with primary  care and cardiology.  Reassuring MRI IAC with no significant abnormalities other than incidental findings of small chronic lacunar infarct within the right frontal lobe, mild background chronic small vessel ischemic changes within the cerebral white matter.  She does have  audiological evaluation showing asymmetric sensorineural hearing loss.  No identifiable cause of this.  I would continue outpatient follow-up, I am happy to see her back in the office with any further questions or concerns she may have.  She will attempt vestibular rehab as well.   - f/u PRN   Thank you for allowing me the opportunity to care for your patient. Please do not hesitate to contact me should you have any other questions.  Sincerely, Chyrl Cohen PA-C [facility] Phone: [phone] Fax: [phone]  09/17/2024, 11:11 AM

## 2024-09-22 ENCOUNTER — Telehealth (INDEPENDENT_AMBULATORY_CARE_PROVIDER_SITE_OTHER): Payer: Self-pay

## 2024-09-22 NOTE — Telephone Encounter (Signed)
 It looks like the referral is in, someone may need to fax it to them. If you go to the visit I had with her its in the orders section. Thanks

## 2024-09-22 NOTE — Telephone Encounter (Signed)
 got the referral faxed over and called to confirm they recieved it, they confirmed they had recieved the fax and have her appointment scheduled.

## 2024-09-22 NOTE — Telephone Encounter (Signed)
 Got it faxed over to them on the phone to confirm they received the faxed order.

## 2024-09-22 NOTE — Telephone Encounter (Signed)
 Pt called about referral you were suppose to send in for her, when she called to check on it they stated they had not received the referral yet

## 2024-09-23 ENCOUNTER — Ambulatory Visit (INDEPENDENT_AMBULATORY_CARE_PROVIDER_SITE_OTHER): Admitting: Physician Assistant

## 2024-10-01 ENCOUNTER — Ambulatory Visit: Admitting: Nurse Practitioner

## 2024-10-09 ENCOUNTER — Ambulatory Visit: Admitting: Nurse Practitioner

## 2024-10-15 ENCOUNTER — Encounter: Payer: Self-pay | Admitting: Cardiology

## 2024-10-15 ENCOUNTER — Ambulatory Visit: Attending: Cardiology | Admitting: Cardiology

## 2024-10-15 VITALS — BP 116/64 | HR 60 | Ht 65.0 in | Wt 195.0 lb

## 2024-10-15 DIAGNOSIS — E782 Mixed hyperlipidemia: Secondary | ICD-10-CM | POA: Diagnosis not present

## 2024-10-15 DIAGNOSIS — I5181 Takotsubo syndrome: Secondary | ICD-10-CM | POA: Diagnosis not present

## 2024-10-15 DIAGNOSIS — I480 Paroxysmal atrial fibrillation: Secondary | ICD-10-CM

## 2024-10-15 MED ORDER — ROSUVASTATIN CALCIUM 10 MG PO TABS: 10.0000 mg | ORAL_TABLET | Freq: Every day | ORAL | 3 refills | Status: AC

## 2024-10-15 NOTE — Progress Notes (Signed)
 Cardiology Office Note  Date: 10/15/2024   ID: Julie Jefferson, DOB 03/19/1946, MRN [id number]  History of Present Illness: Julie Jefferson is a 78 y.o. female last seen in September by Ms. Parthenia RIGGERS, I reviewed her note.  She is here for a follow-up visit.  States that she has been feeling better in general.  No recurring chest pain, no syncope.  She has been undergoing PT for vestibular dysfunction.  I went over her medications.  Blood pressure is normal today on current regimen.  Adjustments have been made in antihypertensive regimen by Dr. Ezzard.  We did discuss her lipids from September and I recommended to switch from Zocor  to Crestor.  Follow-up ischemic testing in September was low risk as noted below.  Physical Exam: VS:  BP 116/64 (BP Location: Left Arm, Cuff Size: Large)   Pulse 60   Ht 5' 5 (1.651 m)   Wt 195 lb (88.5 kg)   SpO2 100%   BMI 32.45 kg/m , BMI Body mass index is 32.45 kg/m.  Wt Readings from Last 3 Encounters:  10/15/24 195 lb (88.5 kg)  09/17/24 190 lb (86.2 kg)  09/03/24 190 lb (86.2 kg)    General: Patient appears comfortable at rest. HEENT: Conjunctiva and lids normal. Neck: Supple, no elevated JVP or carotid bruits. Lungs: Clear to auscultation, nonlabored breathing at rest. Cardiac: Regular rate and rhythm, no S3 or significant systolic murmur. Extremities: No pitting edema.  ECG:  An ECG dated 08/07/2024 was personally reviewed today and demonstrated:  Sinus bradycardia with low voltage.  Labwork: 04/17/2024: Magnesium  1.9 08/24/2024: ALT 26; AST 26; BUN 17; Creatinine, Ser 0.79; Hemoglobin 12.7; Platelets 311; Potassium 4.9; Sodium 140; TSH 5.800     Component Value Date/Time   CHOL 187 08/24/2024 1220   TRIG 235 (H) 08/24/2024 1220   HDL 63 08/24/2024 1220   CHOLHDL 3.0 08/24/2024 1220   CHOLHDL 2.3 10/25/2007 0058   VLDL 11 10/25/2007 0058   LDLCALC 85 08/24/2024 1220   Other Studies Reviewed Today:  Lexiscan  Myoview   08/31/2024:   No ST deviation was noted. Pharmacological protocol was used.   LV perfusion is normal. There is no evidence of ischemia. There is no evidence of infarction.   Left ventricular function is normal. Nuclear stress EF: 66%.   Findings are consistent with no ischemia and no infarction. The study is low risk.  Assessment and Plan:  1.  Paroxysmal atrial fibrillation with CHA2DS2-VASc score of 5.  She is status post atrial fibrillation ablation in 2022 by Dr. Kelsie.  She had a recurrence of atrial fibrillation in April 2024 and underwent cardioversion.  Now following with Dr. Nancey.  Continues to do very well with no obvious recurring episodes over the last year.  Continue Eliquis  5 mg twice daily for stroke prophylaxis along with Lopressor  12.5 mg twice daily.   2.  Mixed hyperlipidemia.  LDL 85 in September.  Currently on Zocor  20 mg daily.  Discussed switching to Crestor 10 mg daily.  Would like to get LDL at least under 70.   3.  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  Echocardiogram in April 2023 revealed LVEF 70 to 75%.  4.  History of chest pain and coronary artery calcification by CT imaging. Follow-up ischemic testing in September was reassuring, low risk Myoview  with no ischemia and LVEF 66%.  Disposition:  Follow up 6 months.  Signed, Jayson JUDITHANN Sierras, M.D., F.A.C.C.  [facility]

## 2024-10-15 NOTE — Patient Instructions (Signed)
 Medication Instructions:   STOP Zocor   START Crestor 5 mg daily  Labwork: None today  Testing/Procedures: None today  Follow-Up: 6 months  Any Other Special Instructions Will Be Listed Below (If Applicable).  If you need a refill on your cardiac medications before your next appointment, please call your pharmacy.

## 2024-11-21 ENCOUNTER — Encounter (HOSPITAL_BASED_OUTPATIENT_CLINIC_OR_DEPARTMENT_OTHER): Payer: Self-pay

## 2024-11-21 ENCOUNTER — Emergency Department (HOSPITAL_BASED_OUTPATIENT_CLINIC_OR_DEPARTMENT_OTHER)

## 2024-11-21 ENCOUNTER — Emergency Department (HOSPITAL_BASED_OUTPATIENT_CLINIC_OR_DEPARTMENT_OTHER)
Admission: EM | Admit: 2024-11-21 | Discharge: 2024-11-21 | Disposition: A | Discharge status: Home / Self Care | Attending: Emergency Medicine | Admitting: Emergency Medicine

## 2024-11-21 ENCOUNTER — Other Ambulatory Visit (HOSPITAL_BASED_OUTPATIENT_CLINIC_OR_DEPARTMENT_OTHER): Payer: Self-pay

## 2024-11-21 ENCOUNTER — Other Ambulatory Visit: Payer: Self-pay

## 2024-11-21 DIAGNOSIS — I4891 Unspecified atrial fibrillation: Secondary | ICD-10-CM | POA: Diagnosis not present

## 2024-11-21 DIAGNOSIS — Z79899 Other long term (current) drug therapy: Secondary | ICD-10-CM | POA: Insufficient documentation

## 2024-11-21 DIAGNOSIS — J101 Influenza due to other identified influenza virus with other respiratory manifestations: Secondary | ICD-10-CM | POA: Insufficient documentation

## 2024-11-21 DIAGNOSIS — Z7901 Long term (current) use of anticoagulants: Secondary | ICD-10-CM | POA: Insufficient documentation

## 2024-11-21 DIAGNOSIS — M545 Low back pain, unspecified: Secondary | ICD-10-CM | POA: Diagnosis present

## 2024-11-21 DIAGNOSIS — M62830 Muscle spasm of back: Secondary | ICD-10-CM | POA: Insufficient documentation

## 2024-11-21 DIAGNOSIS — I700 Atherosclerosis of aorta: Secondary | ICD-10-CM | POA: Diagnosis not present

## 2024-11-21 DIAGNOSIS — R109 Unspecified abdominal pain: Secondary | ICD-10-CM | POA: Diagnosis not present

## 2024-11-21 LAB — RESP PANEL BY RT-PCR (RSV, FLU A&B, COVID)  RVPGX2
Influenza A by PCR: POSITIVE — AB
Influenza B by PCR: NEGATIVE
Resp Syncytial Virus by PCR: NEGATIVE
SARS Coronavirus 2 by RT PCR: NEGATIVE

## 2024-11-21 LAB — URINALYSIS, ROUTINE W REFLEX MICROSCOPIC
Bilirubin Urine: NEGATIVE
Glucose, UA: NEGATIVE mg/dL
Hgb urine dipstick: NEGATIVE
Ketones, ur: NEGATIVE mg/dL
Leukocytes,Ua: NEGATIVE
Nitrite: NEGATIVE
Protein, ur: NEGATIVE mg/dL
Specific Gravity, Urine: 1.024 (ref 1.005–1.030)
pH: 6 (ref 5.0–8.0)

## 2024-11-21 LAB — CBC WITH DIFFERENTIAL/PLATELET
Abs Immature Granulocytes: 0.01 K/uL (ref 0.00–0.07)
Basophils Absolute: 0 K/uL (ref 0.0–0.1)
Basophils Relative: 1 %
Eosinophils Absolute: 0 K/uL (ref 0.0–0.5)
Eosinophils Relative: 1 %
HCT: 39.1 % (ref 36.0–46.0)
Hemoglobin: 13 g/dL (ref 12.0–15.0)
Immature Granulocytes: 0 %
Lymphocytes Relative: 15 %
Lymphs Abs: 0.8 K/uL (ref 0.7–4.0)
MCH: 30.4 pg (ref 26.0–34.0)
MCHC: 33.2 g/dL (ref 30.0–36.0)
MCV: 91.6 fL (ref 80.0–100.0)
Monocytes Absolute: 0.4 K/uL (ref 0.1–1.0)
Monocytes Relative: 8 %
Neutro Abs: 3.8 K/uL (ref 1.7–7.7)
Neutrophils Relative %: 75 %
Platelets: 279 K/uL (ref 150–400)
RBC: 4.27 MIL/uL (ref 3.87–5.11)
RDW: 13.4 % (ref 11.5–15.5)
WBC: 5.1 K/uL (ref 4.0–10.5)
nRBC: 0 % (ref 0.0–0.2)

## 2024-11-21 LAB — COMPREHENSIVE METABOLIC PANEL WITH GFR
ALT: 36 U/L (ref 0–44)
AST: 35 U/L (ref 15–41)
Albumin: 4.7 g/dL (ref 3.5–5.0)
Alkaline Phosphatase: 61 U/L (ref 38–126)
Anion gap: 14 (ref 5–15)
BUN: 18 mg/dL (ref 8–23)
CO2: 26 mmol/L (ref 22–32)
Calcium: 10 mg/dL (ref 8.9–10.3)
Chloride: 98 mmol/L (ref 98–111)
Creatinine, Ser: 0.89 mg/dL (ref 0.44–1.00)
GFR, Estimated: >60 mL/min
Glucose, Bld: 110 mg/dL — ABNORMAL HIGH (ref 70–99)
Potassium: 3.3 mmol/L — ABNORMAL LOW (ref 3.5–5.1)
Sodium: 137 mmol/L (ref 135–145)
Total Bilirubin: 0.3 mg/dL (ref 0.0–1.2)
Total Protein: 8.1 g/dL (ref 6.5–8.1)

## 2024-11-21 MED ORDER — MORPHINE SULFATE (PF) 4 MG/ML IV SOLN
4.0000 mg | Freq: Once | INTRAVENOUS | Status: AC
  Administered 2024-11-21: 4 mg via INTRAVENOUS
  Filled 2024-11-21: qty 1

## 2024-11-21 MED ORDER — DEXAMETHASONE SOD PHOSPHATE PF 10 MG/ML IJ SOLN
10.0000 mg | Freq: Once | INTRAMUSCULAR | Status: AC
  Administered 2024-11-21: 10 mg via INTRAVENOUS

## 2024-11-21 MED ORDER — METHYLPREDNISOLONE 4 MG PO TBPK
ORAL_TABLET | ORAL | 0 refills | Status: AC
  Filled 2024-11-21: qty 21, 6d supply, fill #0

## 2024-11-21 MED ORDER — CYCLOBENZAPRINE HCL 10 MG PO TABS
10.0000 mg | ORAL_TABLET | Freq: Two times a day (BID) | ORAL | 0 refills | Status: AC | PRN
  Filled 2024-11-21: qty 20, 10d supply, fill #0

## 2024-11-21 MED ORDER — FENTANYL CITRATE (PF) 50 MCG/ML IJ SOSY
50.0000 ug | PREFILLED_SYRINGE | Freq: Once | INTRAMUSCULAR | Status: AC
  Administered 2024-11-21: 50 ug via INTRAVENOUS
  Filled 2024-11-21: qty 1

## 2024-11-21 MED ORDER — ONDANSETRON HCL 4 MG/2ML IJ SOLN
4.0000 mg | Freq: Once | INTRAMUSCULAR | Status: AC
  Administered 2024-11-21: 4 mg via INTRAVENOUS
  Filled 2024-11-21: qty 2

## 2024-11-21 NOTE — ED Triage Notes (Signed)
 She c/o non-traumatic low back pain since last night, radiating into left flank. She denies fever.

## 2024-11-21 NOTE — ED Provider Notes (Signed)
 " [place] EMERGENCY DEPARTMENT AT [facility] Provider Note   CSN: [id number] Arrival date & time: 11/21/24  1101     Patient presents with: Back Pain   Julie Jefferson is a 78 y.o. female.   Patient here with low back pain that started last night.  No loss of bowel or bladder.  She has had prior back surgery.  Is on blood thinner.  She denies any fever chills cough sputum production and.  But she does feel like she has been getting over a cold.  No pain with urination.  No problems emptying bladder.  No history of kidney stones.  History of A-fib.  The history is provided by the patient.       Prior to Admission medications  Medication Sig Start Date End Date Taking? Authorizing Provider  cyclobenzaprine  (FLEXERIL ) 10 MG tablet Take 1 tablet (10 mg total) by mouth 2 (two) times daily as needed for muscle spasms. 11/21/24  Yes Rosabel Sermeno, DO  methylPREDNISolone  (MEDROL  DOSEPAK) 4 MG TBPK tablet Follow package insert 11/21/24  Yes Laird Runnion, DO  amLODipine  (NORVASC ) 5 MG tablet Take 5 mg by mouth in the morning. 10/03/11   [provider]  Biotin 89999 MCG TABS Take 10,000 mcg by mouth at bedtime.    [provider]  Dorzolamide  HCl-Timolol  Mal PF 2-0.5 % SOLN Place 1 drop into both eyes 2 (two) times daily. 01/02/23   [provider]  ELIQUIS  5 MG TABS tablet TAKE 1 TABLET BY MOUTH TWICE A DAY 08/10/24   Debera Jayson MATSU, MD  levothyroxine  (SYNTHROID ) 88 MCG tablet Take 88 mcg by mouth daily before breakfast.    [provider]  losartan-hydrochlorothiazide (HYZAAR) 50-12.5 MG tablet Take 1 tablet by mouth daily. 09/21/24   [provider]  meclizine  (ANTIVERT ) 25 MG tablet Take 25 mg by mouth 3 (three) times daily as needed for dizziness.    [provider]  metoprolol  tartrate (LOPRESSOR ) 25 MG tablet TAKE 1/2 TABLET BY MOUTH TWICE A DAY 08/04/24   Debera Jayson MATSU, MD  Multiple Vitamins-Minerals (MEGAVITE  FRUITS & VEGGIES PO) Take 4 capsules by mouth in the morning. (2) Fruit + (2) Veggies (Balance of Nature Fruits & Veggies)    [provider]  Omeprazole-Sodium Bicarbonate (ZEGERID) 20-1100 MG CAPS capsule Take 1 capsule by mouth daily before breakfast.    [provider]  ondansetron  (ZOFRAN ) 4 MG tablet Take 1 tablet (4 mg total) by mouth daily as needed for nausea or vomiting. 04/17/24 04/17/25  Tanda Locus, MD  OVER THE COUNTER MEDICATION Take 1 capsule by mouth 2 (two) times daily. [employer] Supplement    [provider]  Polyethyl Glyc-Propyl Glyc PF (SYSTANE HYDRATION PF) 0.4-0.3 % SOLN Place 1 drop into both eyes 3 (three) times daily as needed (dry/irritated eyes.).    [provider]  polyethylene glycol powder (GLYCOLAX /MIRALAX ) powder Take 17 g by mouth daily as needed for mild constipation.     [provider]  rosuvastatin  (CRESTOR ) 10 MG tablet Take 1 tablet (10 mg total) by mouth daily. 10/15/24 01/13/25  Debera Jayson MATSU, MD  traMADol  (ULTRAM ) 50 MG tablet Take 1 tablet (50 mg total) by mouth every 6 (six) hours as needed for moderate pain (pain score 4-6) or severe pain (pain score 7-10). 04/17/24   Tanda Locus, MD    Allergies: Alphagan [brimonidine], Dorzolamide  hcl, Penicillins, and Latex    Review of Systems  Updated Vital Signs BP 119/60  Pulse 61   Temp 98.7 F (37.1 C)   Resp 14   SpO2 98%   Physical Exam Vitals and nursing note reviewed.  Constitutional:      General: She is not in acute distress.    Appearance: She is well-developed. She is not ill-appearing.  HENT:     Head: Normocephalic and atraumatic.     Nose: Nose normal.     Mouth/Throat:     Mouth: Mucous membranes are moist.  Eyes:     Extraocular Movements: Extraocular movements intact.     Conjunctiva/sclera: Conjunctivae normal.     Pupils: Pupils are equal, round, and reactive to light.  Cardiovascular:     Rate and Rhythm: Normal rate and  regular rhythm.     Pulses: Normal pulses.     Heart sounds: Normal heart sounds. No murmur heard. Pulmonary:     Effort: Pulmonary effort is normal. No respiratory distress.     Breath sounds: Normal breath sounds.  Abdominal:     Palpations: Abdomen is soft.     Tenderness: There is no abdominal tenderness.  Musculoskeletal:        General: Tenderness present. No swelling.     Cervical back: Normal range of motion and neck supple.     Comments: Tenderness in the paraspinal lumbar muscles bilaterally  Skin:    General: Skin is warm and dry.     Capillary Refill: Capillary refill takes less than 2 seconds.  Neurological:     General: No focal deficit present.     Mental Status: She is alert and oriented to person, place, and time.     Cranial Nerves: No cranial nerve deficit.     Sensory: No sensory deficit.     Motor: No weakness.     Coordination: Coordination normal.     Comments: Normal strength and sensation throughout  Psychiatric:        Mood and Affect: Mood normal.     (all labs ordered are listed, but only abnormal results are displayed) Labs Reviewed  RESP PANEL BY RT-PCR (RSV, FLU A&B, COVID)  RVPGX2 - Abnormal; Notable for the following components:      Result Value   Influenza A by PCR POSITIVE (*)    All other components within normal limits  COMPREHENSIVE METABOLIC PANEL WITH GFR - Abnormal; Notable for the following components:   Potassium 3.3 (*)    Glucose, Bld 110 (*)    All other components within normal limits  URINALYSIS, ROUTINE W REFLEX MICROSCOPIC  CBC WITH DIFFERENTIAL/PLATELET    EKG: None  Radiology: CT Renal Stone Study Result Date: 11/21/2024 CLINICAL DATA:  Abdominal/flank pain.  Concern for kidney stone. EXAM: CT ABDOMEN AND PELVIS WITHOUT CONTRAST TECHNIQUE: Multidetector CT imaging of the abdomen and pelvis was performed following the standard protocol without IV contrast. RADIATION DOSE REDUCTION: This exam was performed according  to the departmental dose-optimization program which includes automated exposure control, adjustment of the mA and/or kV according to patient size and/or use of iterative reconstruction technique. COMPARISON:  None Available. FINDINGS: Evaluation of this exam is limited in the absence of intravenous contrast. Lower chest: The visualized lung bases are clear. No intra-abdominal free air or free fluid. Hepatobiliary: Small liver cysts. No acute orientation. Cholecystectomy. No retained calcified stone noted in the central CBD. Pancreas: Unremarkable. No pancreatic ductal dilatation or surrounding inflammatory changes. Spleen: Normal in size without focal abnormality. Adrenals/Urinary Tract: The adrenal glands are unremarkable. There is no hydronephrosis or  nephrolithiasis on either side. The visualized ureters and urinary bladder appear unremarkable. Stomach/Bowel: There is no bowel obstruction or active inflammation. The appendix is normal. Vascular/Lymphatic: Mild aortoiliac atherosclerotic disease. The IVC is unremarkable. No portal venous gas. There is no adenopathy. Reproductive: Hysterectomy.  No suspicious adnexal masses. Other: None Musculoskeletal: Degenerative changes of the lower lumbar spine. No acute osseous pathology. IMPRESSION: 1. No acute intra-abdominal or pelvic pathology. No hydronephrosis or nephrolithiasis. 2.  Aortic Atherosclerosis (ICD10-I70.0). Electronically Signed   By: Vanetta Chou M.D.   On: 11/21/2024 12:16     Procedures   Medications Ordered in the ED  dexamethasone  (DECADRON ) injection 10 mg (has no administration in time range)  fentaNYL  (SUBLIMAZE ) injection 50 mcg (50 mcg Intravenous Given 11/21/24 1146)  morphine  (PF) 4 MG/ML injection 4 mg (4 mg Intravenous Given 11/21/24 1212)  ondansetron  (ZOFRAN ) injection 4 mg (4 mg Intravenous Given 11/21/24 1211)                                    Medical Decision Making Amount and/or Complexity of Data Reviewed Labs:  ordered. Radiology: ordered.  Risk Prescription drug management.   Julie Jefferson is here with low back pain, congestion.  Normal vitals.  No fever.  Low back pain since last night but she has had some bronchitis type symptoms here recently. Mostly better from that standpoint.  She is neurovascular neuromuscular intact on exam.  She is mostly tender in the lower back.  No kidney stone history.  No pain with urination.  Overall got a CBC CMP lipase urinalysis CT scan which were unremarkable.  No kidney stone no bowel obstruction no traumatic fracture of the back.  Flu test was positive.  Urinalysis negative for infection.  I have no concern for cauda equina or any other acute process.  I do think her pain is likely from muscle spasm from coughing likely secondary to her flu infection.  Will prescribe her steroids muscle relaxant and continue Tylenol  at home.  She is neurovascular neuromuscular intact on exam.  Discharge.  Follow-up with primary care.  Told to return if symptoms worsen.  This chart was dictated using voice recognition software.  Despite best efforts to proofread,  errors can occur which can change the documentation meaning.      Final diagnoses:  Influenza A  Back spasm    ED Discharge Orders          Ordered    methylPREDNISolone  (MEDROL  DOSEPAK) 4 MG TBPK tablet        11/21/24 1254    cyclobenzaprine  (FLEXERIL ) 10 MG tablet  2 times daily PRN        11/21/24 1254               Ruthe Cornet, DO 11/21/24 1256  "

## 2024-11-21 NOTE — Discharge Instructions (Addendum)
 Continue 1000 mg of Tylenol  every 6 hours as needed for pain.  I have prescribed you a muscle relaxant called Flexeril  for muscle spasms.  This medication is sedating so please be careful with its use.  Take steroids as prescribed with the next dose tomorrow.

## 2024-11-21 NOTE — ED Notes (Addendum)
 Reviewed discharge instructions, medications, and home care with pt and family. Both verbalized understanding and had no further questions. Pt exited ED without complications.
